# Patient Record
Sex: Female | Born: 1950
Health system: Southern US, Community
[De-identification: ages and names within clinical notes are randomized; demographics above are authoritative.]

## PROBLEM LIST (undated history)

## (undated) DIAGNOSIS — C9110 Chronic lymphocytic leukemia of B-cell type not having achieved remission: Secondary | ICD-10-CM

## (undated) DIAGNOSIS — F419 Anxiety disorder, unspecified: Secondary | ICD-10-CM

## (undated) DIAGNOSIS — F329 Major depressive disorder, single episode, unspecified: Secondary | ICD-10-CM

## (undated) DIAGNOSIS — Z8639 Personal history of other endocrine, nutritional and metabolic disease: Secondary | ICD-10-CM

## (undated) DIAGNOSIS — Z87898 Personal history of other specified conditions: Secondary | ICD-10-CM

## (undated) DIAGNOSIS — Z8659 Personal history of other mental and behavioral disorders: Secondary | ICD-10-CM

## (undated) DIAGNOSIS — F439 Reaction to severe stress, unspecified: Secondary | ICD-10-CM

## (undated) DIAGNOSIS — C801 Malignant (primary) neoplasm, unspecified: Secondary | ICD-10-CM

## (undated) DIAGNOSIS — I10 Essential (primary) hypertension: Secondary | ICD-10-CM

## (undated) DIAGNOSIS — F32A Depression, unspecified: Secondary | ICD-10-CM

## (undated) HISTORY — PX: TUBAL LIGATION: SHX77

## (undated) HISTORY — DX: Reaction to severe stress, unspecified: F43.9

## (undated) HISTORY — DX: Personal history of other specified conditions: Z87.898

## (undated) HISTORY — DX: Personal history of other mental and behavioral disorders: Z86.59

## (undated) HISTORY — DX: Personal history of other endocrine, nutritional and metabolic disease: Z86.39

## (undated) HISTORY — DX: Chronic lymphocytic leukemia of B-cell type not having achieved remission: C91.10

---

## 1999-07-03 ENCOUNTER — Other Ambulatory Visit: Admission: RE | Admit: 1999-07-03 | Discharge: 1999-07-03 | Payer: Self-pay | Admitting: Family Medicine

## 2000-08-26 ENCOUNTER — Other Ambulatory Visit: Admission: RE | Admit: 2000-08-26 | Discharge: 2000-08-26 | Payer: Self-pay | Admitting: Family Medicine

## 2002-09-14 ENCOUNTER — Other Ambulatory Visit: Admission: RE | Admit: 2002-09-14 | Discharge: 2002-09-14 | Payer: Self-pay | Admitting: Family Medicine

## 2003-12-04 ENCOUNTER — Other Ambulatory Visit: Admission: RE | Admit: 2003-12-04 | Discharge: 2003-12-04 | Payer: Self-pay | Admitting: Family Medicine

## 2005-02-11 ENCOUNTER — Ambulatory Visit: Payer: Self-pay | Admitting: Family Medicine

## 2005-02-16 ENCOUNTER — Ambulatory Visit: Payer: Self-pay | Admitting: Family Medicine

## 2005-10-06 ENCOUNTER — Encounter: Payer: Self-pay | Admitting: Family Medicine

## 2005-10-06 ENCOUNTER — Other Ambulatory Visit: Admission: RE | Admit: 2005-10-06 | Discharge: 2005-10-06 | Payer: Self-pay | Admitting: Family Medicine

## 2005-10-06 ENCOUNTER — Ambulatory Visit: Payer: Self-pay | Admitting: Family Medicine

## 2005-10-28 ENCOUNTER — Ambulatory Visit: Payer: Self-pay | Admitting: Family Medicine

## 2005-12-15 HISTORY — PX: COLONOSCOPY: SHX174

## 2005-12-31 ENCOUNTER — Ambulatory Visit: Payer: Self-pay | Admitting: Unknown Physician Specialty

## 2007-03-30 ENCOUNTER — Encounter: Payer: Self-pay | Admitting: Family Medicine

## 2007-03-30 DIAGNOSIS — F329 Major depressive disorder, single episode, unspecified: Secondary | ICD-10-CM

## 2007-03-30 DIAGNOSIS — R739 Hyperglycemia, unspecified: Secondary | ICD-10-CM

## 2007-03-30 DIAGNOSIS — N6019 Diffuse cystic mastopathy of unspecified breast: Secondary | ICD-10-CM | POA: Insufficient documentation

## 2007-05-02 ENCOUNTER — Ambulatory Visit: Payer: Self-pay | Admitting: Family Medicine

## 2007-05-19 ENCOUNTER — Encounter: Payer: Self-pay | Admitting: Family Medicine

## 2007-05-19 ENCOUNTER — Ambulatory Visit: Payer: Self-pay | Admitting: Family Medicine

## 2007-05-25 ENCOUNTER — Ambulatory Visit: Payer: Self-pay | Admitting: Family Medicine

## 2007-05-25 ENCOUNTER — Encounter: Payer: Self-pay | Admitting: Family Medicine

## 2007-06-01 ENCOUNTER — Encounter: Payer: Self-pay | Admitting: Family Medicine

## 2007-06-10 ENCOUNTER — Encounter (INDEPENDENT_AMBULATORY_CARE_PROVIDER_SITE_OTHER): Payer: Self-pay | Admitting: *Deleted

## 2007-12-08 ENCOUNTER — Ambulatory Visit: Payer: Self-pay | Admitting: Family Medicine

## 2007-12-08 DIAGNOSIS — F411 Generalized anxiety disorder: Secondary | ICD-10-CM | POA: Insufficient documentation

## 2008-01-30 ENCOUNTER — Telehealth: Payer: Self-pay | Admitting: Family Medicine

## 2008-05-10 ENCOUNTER — Ambulatory Visit: Payer: Self-pay | Admitting: Family Medicine

## 2008-05-10 ENCOUNTER — Other Ambulatory Visit: Admission: RE | Admit: 2008-05-10 | Discharge: 2008-05-10 | Payer: Self-pay | Admitting: Family Medicine

## 2008-05-10 ENCOUNTER — Encounter: Payer: Self-pay | Admitting: Family Medicine

## 2008-05-14 LAB — CONVERTED CEMR LAB
ALT: 31 units/L (ref 0–35)
Albumin: 4.2 g/dL (ref 3.5–5.2)
Alkaline Phosphatase: 69 units/L (ref 39–117)
BUN: 22 mg/dL (ref 6–23)
CO2: 29 meq/L (ref 19–32)
Calcium: 9.2 mg/dL (ref 8.4–10.5)
Cholesterol: 208 mg/dL (ref 0–200)
Eosinophils Relative: 3.3 % (ref 0.0–5.0)
GFR calc Af Amer: 112 mL/min
Glucose, Bld: 88 mg/dL (ref 70–99)
HCT: 38 % (ref 36.0–46.0)
Hemoglobin: 13.3 g/dL (ref 12.0–15.0)
Monocytes Absolute: 0.6 10*3/uL (ref 0.1–1.0)
Monocytes Relative: 8.6 % (ref 3.0–12.0)
Neutro Abs: 3.5 10*3/uL (ref 1.4–7.7)
RBC: 4.22 M/uL (ref 3.87–5.11)
TSH: 2.34 microintl units/mL (ref 0.35–5.50)
Total CHOL/HDL Ratio: 3.7
Total Protein: 7.4 g/dL (ref 6.0–8.3)
WBC: 6.5 10*3/uL (ref 4.5–10.5)

## 2008-05-24 ENCOUNTER — Encounter: Payer: Self-pay | Admitting: Family Medicine

## 2008-05-24 ENCOUNTER — Ambulatory Visit: Payer: Self-pay | Admitting: Family Medicine

## 2008-08-15 ENCOUNTER — Ambulatory Visit: Payer: Self-pay | Admitting: Family Medicine

## 2008-08-15 DIAGNOSIS — E7800 Pure hypercholesterolemia, unspecified: Secondary | ICD-10-CM | POA: Insufficient documentation

## 2008-08-16 LAB — CONVERTED CEMR LAB
ALT: 46 units/L — ABNORMAL HIGH (ref 0–35)
Cholesterol: 230 mg/dL (ref 0–200)
Direct LDL: 143.2 mg/dL
Total CHOL/HDL Ratio: 3.6

## 2008-09-20 ENCOUNTER — Ambulatory Visit: Payer: Self-pay | Admitting: Family Medicine

## 2008-09-20 DIAGNOSIS — R740 Nonspecific elevation of levels of transaminase and lactic acid dehydrogenase [LDH]: Secondary | ICD-10-CM

## 2008-11-01 ENCOUNTER — Telehealth: Payer: Self-pay | Admitting: Family Medicine

## 2009-03-12 ENCOUNTER — Telehealth: Payer: Self-pay | Admitting: Family Medicine

## 2009-05-20 ENCOUNTER — Telehealth: Payer: Self-pay | Admitting: Family Medicine

## 2009-09-06 ENCOUNTER — Ambulatory Visit: Payer: Self-pay | Admitting: Family Medicine

## 2009-09-09 LAB — CONVERTED CEMR LAB
ALT: 33 units/L (ref 0–35)
AST: 32 units/L (ref 0–37)
Albumin: 4.4 g/dL (ref 3.5–5.2)
Alkaline Phosphatase: 60 units/L (ref 39–117)
BUN: 14 mg/dL (ref 6–23)
Basophils Absolute: 0 10*3/uL (ref 0.0–0.1)
CO2: 31 meq/L (ref 19–32)
Cholesterol: 191 mg/dL (ref 0–200)
Eosinophils Absolute: 0.1 10*3/uL (ref 0.0–0.7)
GFR calc non Af Amer: 109.57 mL/min (ref >60)
Glucose, Bld: 85 mg/dL (ref 70–99)
Hemoglobin: 13.1 g/dL (ref 12.0–15.0)
Lymphocytes Relative: 35.9 % (ref 12.0–46.0)
MCHC: 33.3 g/dL (ref 30.0–36.0)
Monocytes Relative: 13 % — ABNORMAL HIGH (ref 3.0–12.0)
Neutro Abs: 2.4 10*3/uL (ref 1.4–7.7)
Neutrophils Relative %: 47.2 % (ref 43.0–77.0)
Platelets: 207 10*3/uL (ref 150.0–400.0)
Potassium: 4.2 meq/L (ref 3.5–5.1)
RDW: 11.8 % (ref 11.5–14.6)
Sodium: 141 meq/L (ref 135–145)
TSH: 2.54 microintl units/mL (ref 0.35–5.50)
Total Protein: 7.7 g/dL (ref 6.0–8.3)
VLDL: 14.8 mg/dL (ref 0.0–40.0)

## 2009-09-10 ENCOUNTER — Ambulatory Visit: Payer: Self-pay | Admitting: Family Medicine

## 2009-09-10 ENCOUNTER — Encounter: Payer: Self-pay | Admitting: Family Medicine

## 2009-09-16 ENCOUNTER — Encounter (INDEPENDENT_AMBULATORY_CARE_PROVIDER_SITE_OTHER): Payer: Self-pay | Admitting: *Deleted

## 2009-12-18 ENCOUNTER — Telehealth: Payer: Self-pay | Admitting: Family Medicine

## 2010-05-07 ENCOUNTER — Telehealth: Payer: Self-pay | Admitting: Family Medicine

## 2010-09-16 NOTE — Miscellaneous (Signed)
Summary: mammogram results  Clinical Lists Changes  Observations: Added new observation of MAMMO DUE: 09/2010 (09/16/2009 9:20) Added new observation of MAMMOGRAM: normal (09/16/2009 9:20)      Preventive Care Screening  Mammogram:    Date:  09/16/2009    Next Due:  09/2010    Results:  normal

## 2010-09-16 NOTE — Progress Notes (Signed)
Summary: refill request for alprazolam  Phone Note Refill Request Message from:  Fax from Pharmacy on May 07, 2010 10:22 AM  Refills Requested: Medication #1:  ALPRAZOLAM 0.5 MG  TABS 1 by mouth two times a day as needed severe anxiety   Last Refilled: Dec 01, 1950 refill request from [facility] pharmacy. [phone]  Initial call taken by: Melody Comas,  May 07, 2010 10:23 AM  Follow-up for Phone Call        px written on EMR for call in  Follow-up by: Judith Part MD,  May 07, 2010 11:14 AM  Additional Follow-up for Phone Call Additional follow up Details #1::        Medication phoned to pharmacy.  Additional Follow-up by: Delilah Shan CMA (AAMA),  May 07, 2010 11:29 AM    New/Updated Medications: ALPRAZOLAM 0.5 MG  TABS (ALPRAZOLAM) 1 by mouth two times a day as needed severe anxiety Prescriptions: ALPRAZOLAM 0.5 MG  TABS (ALPRAZOLAM) 1 by mouth two times a day as needed severe anxiety  #30 x 0   Entered and Authorized by:   Judith Part MD   Signed by:   Delilah Shan CMA (AAMA) on 05/07/2010   Method used:   Telephoned to ...         RxID:   [id number]

## 2010-09-16 NOTE — Assessment & Plan Note (Signed)
Summary: CPX/CLE   Vital Signs:  Patient profile:   60 year old female Height:      65.5 inches Weight:      146 pounds BMI:     24.01 Temp:     98.2 degrees F oral Pulse rate:   68 / minute Pulse rhythm:   regular BP sitting:   142 / 100  (left arm) Cuff size:   regular  Vitals Entered By: Lowella Petties CMA (September 06, 2009 10:57 AM)  Serial Vital Signs/Assessments:  Time      Position  BP       Pulse  Resp  Temp     By                     134/88                         Judith Part MD  CC: 30 minute check up   History of Present Illness: here for health mt exam and to rev chronic med problems has been feeling good overall   started on paxil - is working great , so not using other meds much stress is overall better too   wt is down 4 lb- with wt watchers - she really likes it  is doing it on line  is starting to exercise - elliptical    bp up today 140/100 on first check   lipids due - last LDL was in 140s   colonosc with polyp 07-- rec 10 y f/u   dexa nl in 07 ca and D- is not taking them   mam 10/09- needs to set up  self exam   Td 05 other imms got flu shot in nov  also got shingles vaccine   pap 9.09-- no problems or new partners 3 nl in a row   Allergies: 1)  ! Buspar 2)  Penicillin  Past History:  Past Medical History: Last updated: 09/20/2008 Depression with anx situational stress foot pain hyperlipidemia  fibrocystic breasts  Past Surgical History: Last updated: 03/30/2007 Tubal ligation Dexa-normal (12/2003) Colonoscopy- polyps, tics (12/2005)  Family History: Last updated: 12/08/2007 Father: DM late in life, chol Mother: cholesterol, mental illness Siblings: brother deceased- childhood leukemia, sister with clinical depression grandfather- schizophrenic  Social History: Last updated: 09/20/2008 Marital Status: divorced- re married Children: 3 Occupation: Film/video editor derm Former Smoker occ alcohol -- 1-2 glasses day  (inst to cut to 1 drink per day 2/10)  Risk Factors: Smoking Status: quit (05/02/2007)  Review of Systems General:  Denies fatigue, loss of appetite, and malaise. Eyes:  Denies blurring and eye pain. CV:  Denies chest pain or discomfort, palpitations, and shortness of breath with exertion. Resp:  Denies cough and wheezing. GI:  Denies abdominal pain, change in bowel habits, and indigestion. GU:  Denies abnormal vaginal bleeding, discharge, dysuria, and urinary frequency. MS:  Denies joint pain and muscle aches. Derm:  Denies itching, lesion(s), poor wound healing, and rash. Neuro:  Denies numbness and tingling. Psych:  mood is better . Endo:  Denies excessive thirst and excessive urination. Heme:  Denies abnormal bruising and bleeding.  Physical Exam  General:  Well-developed,well-nourished,in no acute distress; alert,appropriate and cooperative throughout examination Head:  normocephalic, atraumatic, and no abnormalities observed.   Eyes:  vision grossly intact, pupils equal, pupils round, and pupils reactive to light.  no conjunctival pallor, injection or icterus  Ears:  R ear normal and L  ear normal.   Nose:  no nasal discharge.   Mouth:  pharynx pink and moist.   Neck:  supple with full rom and no masses or thyromegally, no JVD or carotid bruit  Chest Wall:  No deformities, masses, or tenderness noted. Breasts:  No mass, nodules, thickening, tenderness, bulging, retraction, inflamation, nipple discharge or skin changes noted.   Lungs:  Normal respiratory effort, chest expands symmetrically. Lungs are clear to auscultation, no crackles or wheezes. Heart:  Normal rate and regular rhythm. S1 and S2 normal without gallop, murmur, click, rub or other extra sounds. Abdomen:  Bowel sounds positive,abdomen soft and non-tender without masses, organomegaly or hernias noted. no renal bruits  Msk:  No deformity or scoliosis noted of thoracic or lumbar spine.  no acute joint changes, full rom  of all joints  Pulses:  R and L carotid,radial,femoral,dorsalis pedis and posterior tibial pulses are full and equal bilaterally Extremities:  No clubbing, cyanosis, edema, or deformity noted with normal full range of motion of all joints.   Neurologic:  sensation intact to light touch, gait normal, and DTRs symmetrical and normal.   Skin:  Intact without suspicious lesions or rashes Cervical Nodes:  No lymphadenopathy noted Axillary Nodes:  No palpable lymphadenopathy Inguinal Nodes:  No significant adenopathy Psych:  normal affect, talkative and pleasant    Impression & Recommendations:  Problem # 1:  HEALTH MAINTENANCE EXAM (ICD-V70.0) reviewed health habits including diet, exercise and skin cancer prevention reviewed health maintenance list and family history better bp on second check today-- will continue to follow  Orders: Venipuncture (91478) TLB-Lipid Panel (80061-LIPID) TLB-BMP (Basic Metabolic Panel-BMET) (80048-METABOL) TLB-Hepatic/Liver Function Pnl (80076-HEPATIC) TLB-TSH (Thyroid Stimulating Hormone) (84443-TSH) TLB-CBC Platelet - w/Differential (85025-CBCD)  Problem # 2:  PURE HYPERCHOLESTEROLEMIA (ICD-272.0) Assessment: Unchanged  expect imp with better diet rev low sat fat diet lab today and adv  Orders: Venipuncture (29562) TLB-Lipid Panel (80061-LIPID) TLB-BMP (Basic Metabolic Panel-BMET) (80048-METABOL) TLB-Hepatic/Liver Function Pnl (80076-HEPATIC) TLB-TSH (Thyroid Stimulating Hormone) (84443-TSH) TLB-CBC Platelet - w/Differential (85025-CBCD)  Labs Reviewed: SGOT: 37 (08/15/2008)   SGPT: 46 (08/15/2008)   HDL:63.2 (08/15/2008), 56.1 (05/10/2008)  LDL:DEL (08/15/2008), DEL (05/10/2008)  Chol:230 (08/15/2008), 208 (05/10/2008)  Trig:87 (08/15/2008), 84 (05/10/2008)  Problem # 3:  ANXIETY (ICD-300.00) Assessment: Improved much imp with paxil- will contine this and good coping skills  Her updated medication list for this problem includes:     Alprazolam 0.5 Mg Tabs (Alprazolam) .Marland Kitchen... 1 by mouth two times a day as needed severe anxiety    Paxil 10 Mg Tabs (Paroxetine hcl) .Marland Kitchen... 1 by mouth at bedtime  Problem # 4:  OTHER SCREENING MAMMOGRAM (ICD-V76.12) Assessment: Comment Only annual mammogram scheduled adv pt to continue regular self breast exams non remarkable breast exam today  Orders: Radiology Referral (Radiology)  Complete Medication List: 1)  Alprazolam 0.5 Mg Tabs (Alprazolam) .Marland Kitchen.. 1 by mouth two times a day as needed severe anxiety 2)  Ambien 10 Mg Tabs (Zolpidem tartrate) .... 1/2 to 1 by mouth at bedtime as needed insomnia 3)  Paxil 10 Mg Tabs (Paroxetine hcl) .Marland Kitchen.. 1 by mouth at bedtime  Patient Instructions: 1)  the current recommendation for calcium intake is 1200-1500 mg daily with 1000 IU of vitamin D  2)  keep working on healthy diet and exercise  3)  labs today  Prescriptions: PAXIL 10 MG TABS (PAROXETINE HCL) 1 by mouth at bedtime  #30 x 11   Entered and Authorized by:   Judith Part MD   Signed  by:   Judith Part MD on 09/06/2009   Method used:   Print then Give to Patient   RxID:   [id number]   Prior Medications (reviewed today): ALPRAZOLAM 0.5 MG  TABS (ALPRAZOLAM) 1 by mouth two times a day as needed severe anxiety AMBIEN 10 MG TABS (ZOLPIDEM TARTRATE) 1/2 to 1 by mouth at bedtime as needed insomnia PAXIL 10 MG TABS (PAROXETINE HCL) 1 by mouth at bedtime Current Allergies: ! BUSPAR PENICILLIN   Influenza Immunization History:    Influenza # 1:  Fluvax 3+ (06/17/2009)

## 2010-09-16 NOTE — Progress Notes (Signed)
Summary: regarding paxil taper  Phone Note Call from Patient Call back at [phone]   Caller: Patient Call For: Judith Part MD Summary of Call: Pt has decided to taper off paxil.  She took 5 mg's for about 2 days and then went to every other day a couple of days ago.  She is having sed effects-  a lot of dizziness, some nausea from the dizziness.  She is asking if she is tapering too fast. Initial call taken by: Lowella Petties CMA,  Dec 18, 2009 10:26 AM  Follow-up for Phone Call        go back to 5 mg for about 2 weeks - then every other day-- I think that will help  Follow-up by: Judith Part MD,  Dec 18, 2009 11:17 AM  Additional Follow-up for Phone Call Additional follow up Details #1::        Patient notified as instructed by telephone. Lewanda Rife LPN  Dec 18, 5641 11:26 AM

## 2010-12-30 ENCOUNTER — Ambulatory Visit: Payer: Self-pay | Admitting: Family Medicine

## 2011-12-31 ENCOUNTER — Telehealth: Payer: Self-pay | Admitting: Family Medicine

## 2011-12-31 DIAGNOSIS — E7800 Pure hypercholesterolemia, unspecified: Secondary | ICD-10-CM

## 2011-12-31 DIAGNOSIS — R7309 Other abnormal glucose: Secondary | ICD-10-CM

## 2011-12-31 DIAGNOSIS — Z0000 Encounter for general adult medical examination without abnormal findings: Secondary | ICD-10-CM

## 2011-12-31 NOTE — Telephone Encounter (Signed)
Message copied by Judy Pimple on Thu Dec 31, 2011  7:13 PM ------      Message from: Baldomero Lamy      Created: Wed Dec 30, 2011 10:49 AM      Regarding: Cpx labs Fri 5/17       Please order  future cpx labs for pt's upcomming lab appt.      Thanks      Rodney Booze

## 2012-01-01 ENCOUNTER — Other Ambulatory Visit (INDEPENDENT_AMBULATORY_CARE_PROVIDER_SITE_OTHER): Payer: BC Managed Care – PPO

## 2012-01-01 DIAGNOSIS — Z0000 Encounter for general adult medical examination without abnormal findings: Secondary | ICD-10-CM

## 2012-01-01 DIAGNOSIS — E7800 Pure hypercholesterolemia, unspecified: Secondary | ICD-10-CM

## 2012-01-01 DIAGNOSIS — R7309 Other abnormal glucose: Secondary | ICD-10-CM

## 2012-01-01 LAB — CBC WITH DIFFERENTIAL/PLATELET
Basophils Absolute: 0 10*3/uL (ref 0.0–0.1)
Eosinophils Absolute: 0.2 10*3/uL (ref 0.0–0.7)
HCT: 38.1 % (ref 36.0–46.0)
Lymphs Abs: 2.2 10*3/uL (ref 0.7–4.0)
MCV: 90.2 fl (ref 78.0–100.0)
Monocytes Absolute: 0.6 10*3/uL (ref 0.1–1.0)
Platelets: 192 10*3/uL (ref 150.0–400.0)
RDW: 12.5 % (ref 11.5–14.6)

## 2012-01-01 LAB — TSH: TSH: 4 u[IU]/mL (ref 0.35–5.50)

## 2012-01-01 LAB — LIPID PANEL
HDL: 60.4 mg/dL (ref >39.00)
Total CHOL/HDL Ratio: 3
VLDL: 13.8 mg/dL (ref 0.0–40.0)

## 2012-01-01 LAB — COMPREHENSIVE METABOLIC PANEL
ALT: 33 U/L (ref 0–35)
Alkaline Phosphatase: 49 U/L (ref 39–117)
Sodium: 141 mEq/L (ref 135–145)
Total Bilirubin: 0.8 mg/dL (ref 0.3–1.2)
Total Protein: 6.9 g/dL (ref 6.0–8.3)

## 2012-01-05 ENCOUNTER — Encounter: Payer: Self-pay | Admitting: Family Medicine

## 2012-01-05 ENCOUNTER — Ambulatory Visit (INDEPENDENT_AMBULATORY_CARE_PROVIDER_SITE_OTHER): Payer: BC Managed Care – PPO | Admitting: Family Medicine

## 2012-01-05 ENCOUNTER — Other Ambulatory Visit (HOSPITAL_COMMUNITY)
Admission: RE | Admit: 2012-01-05 | Discharge: 2012-01-05 | Disposition: A | Discharge status: Home / Self Care | Payer: BC Managed Care – PPO | Source: Ambulatory Visit | Attending: Family Medicine | Admitting: Family Medicine

## 2012-01-05 VITALS — BP 112/80 | HR 64 | Temp 97.9°F | Ht 65.5 in | Wt 147.8 lb

## 2012-01-05 DIAGNOSIS — Z780 Asymptomatic menopausal state: Secondary | ICD-10-CM

## 2012-01-05 DIAGNOSIS — Z01419 Encounter for gynecological examination (general) (routine) without abnormal findings: Secondary | ICD-10-CM | POA: Insufficient documentation

## 2012-01-05 DIAGNOSIS — R7309 Other abnormal glucose: Secondary | ICD-10-CM

## 2012-01-05 DIAGNOSIS — Z1159 Encounter for screening for other viral diseases: Secondary | ICD-10-CM | POA: Insufficient documentation

## 2012-01-05 DIAGNOSIS — Z0000 Encounter for general adult medical examination without abnormal findings: Secondary | ICD-10-CM

## 2012-01-05 DIAGNOSIS — Z1231 Encounter for screening mammogram for malignant neoplasm of breast: Secondary | ICD-10-CM

## 2012-01-05 DIAGNOSIS — E7800 Pure hypercholesterolemia, unspecified: Secondary | ICD-10-CM

## 2012-01-05 NOTE — Assessment & Plan Note (Signed)
With foot fx  Schedule dexa Rev  Ca and D req

## 2012-01-05 NOTE — Assessment & Plan Note (Signed)
a1c below 6 Enc low glycemic diet

## 2012-01-05 NOTE — Patient Instructions (Signed)
We will schedule dexa and mammogram at check out  Try to get 1200-1500 mg of calcium per day with at least 1000 iu of vitamin D - for bone health  Keep working on healthy diet and exercise

## 2012-01-05 NOTE — Assessment & Plan Note (Signed)
?   Hx of hpv in past Pap done No problems

## 2012-01-05 NOTE — Assessment & Plan Note (Signed)
Scheduled annual screening mammogram Nl breast exam today  Encouraged monthly self exams   

## 2012-01-05 NOTE — Assessment & Plan Note (Signed)
Good control with diet Disc goals for lipids and reasons to control them Rev labs with pt Rev low sat fat diet in detail  

## 2012-01-05 NOTE — Assessment & Plan Note (Signed)
Reviewed health habits including diet and exercise and skin cancer prevention Also reviewed health mt list, fam hx and immunizations  Rev wellness lab Will start exercise as soon as foot is healed

## 2012-01-05 NOTE — Progress Notes (Signed)
Subjective:    Patient ID: Anita Buckley, female    DOB: 1950/10/11, 61 y.o.   MRN: [id number]  HPI Here for health maintenance exam and to review chronic medical problems   Has been feeling ok overall   Some stress- husband dx parkinsons  Fractured her foot  Very slow to heal - her R foot  In a boot for months  Is worrisome for OP -- xrays did show some thinning of the bone  ? If family hx Is better about her ca and D now   For 2 months has had pain in elbows- nsaids help  Severity fluctuates - wears ace bandage    Wt is stable with bmi of 24  Lipids Lab Results  Component Value Date   CHOL 190 01/01/2012   CHOL 191 09/06/2009   CHOL 230* 08/15/2008   Lab Results  Component Value Date   HDL 60.40 01/01/2012   HDL 40.98 09/06/2009   HDL 11.9 08/15/2008   Lab Results  Component Value Date   LDLCALC 116* 01/01/2012   LDLCALC 118* 09/06/2009   Lab Results  Component Value Date   TRIG 69.0 01/01/2012   TRIG 74.0 09/06/2009   TRIG 87 08/15/2008   Lab Results  Component Value Date   CHOLHDL 3 01/01/2012   CHOLHDL 3 09/06/2009   CHOLHDL 3.6 CALC 08/15/2008   Lab Results  Component Value Date   LDLDIRECT 143.2 08/15/2008   LDLDIRECT 141.2 05/10/2008   diet -- is pretty good for the most part  Exercise is temp limited  Tried to swim for a while    Hx of mildly high glucose Lab Results  Component Value Date   HGBA1C 5.5 01/01/2012   good control   Pap = last gyn exam , was in 2012 April  Does not plan to return  Thinks she has hpv - has had abn pap in the past   mammo 4/12  Needs to schedule at [facility]  Self exam-no lumps or changes   Colon cancer screening -- last colonoscopy - less than 10 years ago -- at 52   Flu shot - did get one in the fall   Patient Active Problem List  Diagnoses  . PURE HYPERCHOLESTEROLEMIA  . ANXIETY  . DEPRESSION  . FIBROCYSTIC BREAST DISEASE  . HYPERGLYCEMIA  . TRANSAMINASES, SERUM, ELEVATED  . Routine general medical  examination at a health care facility  . Other screening mammogram  . Gynecological examination  . Post-menopause   No past medical history on file. No past surgical history on file. History  Substance Use Topics  . Smoking status: Never Smoker   . Smokeless tobacco: Not on file  . Alcohol Use: Not on file   No family history on file. Allergies  Allergen Reactions  . Buspirone Hcl     REACTION: did not like  . Penicillins     REACTION: reaction as a child   Current Outpatient Prescriptions on File Prior to Visit  Medication Sig Dispense Refill  . Calcium Carbonate-Vit D-Min (CALCIUM 1200 PO) Take 1 capsule by mouth 3 (three) times daily.          Review of Systems Review of Systems  Constitutional: Negative for fever, appetite change, fatigue and unexpected weight change.  Eyes: Negative for pain and visual disturbance.  Respiratory: Negative for cough and shortness of breath.   Cardiovascular: Negative for cp or palpitations    Gastrointestinal: Negative for nausea, diarrhea and constipation.  Genitourinary: Negative for  urgency and frequency.  Skin: Negative for pallor or rash   MSK pos for pain from foot fracture, neg for acute joint changes or loss of ht Neurological: Negative for weakness, light-headedness, numbness and headaches.  Hematological: Negative for adenopathy. Does not bruise/bleed easily.  Psychiatric/Behavioral: Negative for dysphoric mood. The patient is not nervous/anxious.         Objective:   Physical Exam  Constitutional: She appears well-developed and well-nourished.  HENT:  Head: Normocephalic and atraumatic.  Right Ear: External ear normal.  Left Ear: External ear normal.  Nose: Nose normal.  Mouth/Throat: Oropharynx is clear and moist.  Eyes: Conjunctivae and EOM are normal. Pupils are equal, round, and reactive to light. No scleral icterus.  Neck: Normal range of motion. Neck supple. No JVD present. Carotid bruit is not present. No  thyromegaly present.  Cardiovascular: Normal rate, regular rhythm, normal heart sounds and intact distal pulses.  Exam reveals no gallop.   Pulmonary/Chest: Effort normal and breath sounds normal. No respiratory distress. She has no wheezes. She exhibits no tenderness.  Abdominal: Soft. Bowel sounds are normal. She exhibits no distension, no abdominal bruit and no mass. There is no tenderness.  Genitourinary: Vagina normal and uterus normal. No breast swelling, tenderness, discharge or bleeding. There is no rash or tenderness on the right labia. There is no rash or tenderness on the left labia. Uterus is not enlarged, not fixed and not tender. Cervix exhibits no motion tenderness and no friability. Right adnexum displays no mass, no tenderness and no fullness. Left adnexum displays no mass, no tenderness and no fullness. No tenderness or bleeding around the vagina. No vaginal discharge found.       Breast exam: No mass, nodules, thickening, tenderness, bulging, retraction, inflamation, nipple discharge or skin changes noted.  No axillary or clavicular LA.  Chaperoned exam.    Musculoskeletal: Normal range of motion. She exhibits no edema and no tenderness.  Lymphadenopathy:    She has no cervical adenopathy.  Neurological: She is alert. She has normal reflexes. No cranial nerve deficit. She exhibits normal muscle tone. Coordination normal.  Skin: Skin is warm and dry. No rash noted. No erythema. No pallor.  Psychiatric: She has a normal mood and affect.          Assessment & Plan:

## 2012-01-15 ENCOUNTER — Encounter: Payer: Self-pay | Admitting: *Deleted

## 2012-01-19 ENCOUNTER — Ambulatory Visit: Payer: Self-pay | Admitting: Family Medicine

## 2012-01-19 ENCOUNTER — Encounter: Payer: Self-pay | Admitting: Family Medicine

## 2012-01-19 LAB — HM DEXA SCAN

## 2012-01-20 ENCOUNTER — Encounter: Payer: Self-pay | Admitting: Family Medicine

## 2012-01-20 ENCOUNTER — Ambulatory Visit: Payer: Self-pay | Admitting: Family Medicine

## 2012-01-21 ENCOUNTER — Telehealth: Payer: Self-pay

## 2012-01-21 NOTE — Telephone Encounter (Signed)
See Results Encounter/SLS

## 2012-01-21 NOTE — Telephone Encounter (Signed)
Pt returned call. Call cell [phone].

## 2012-02-16 ENCOUNTER — Telehealth: Payer: Self-pay

## 2012-02-16 DIAGNOSIS — Z780 Asymptomatic menopausal state: Secondary | ICD-10-CM

## 2012-02-16 NOTE — Telephone Encounter (Signed)
Will order post men bone density test

## 2012-02-16 NOTE — Telephone Encounter (Signed)
Pt seen 01/05/12; had bone density at [facility] on 01/19/12. Pt request order changed from diagnostic to routine a post menopausal bone scan;Pt spoke with [facility] billing and they will resubmit to pts insurance if gets new diagnosis. Please advise.

## 2012-02-24 NOTE — Telephone Encounter (Signed)
Pt left v/m [facility] has not gotten order yet; request bone density order to [facility] routine bone density covered 100% sent to [facility].  Looks like Dr Milinda Antis put order in. Asher Muir said to send to her and she would ck on order.

## 2012-02-26 ENCOUNTER — Telehealth: Payer: Self-pay | Admitting: Family Medicine

## 2012-02-26 DIAGNOSIS — Z13820 Encounter for screening for osteoporosis: Secondary | ICD-10-CM | POA: Insufficient documentation

## 2012-02-26 NOTE — Telephone Encounter (Signed)
Spoke with pt and let her know I called the Financial services at [facility] for Anita Buckley to change the coding on the Bone Density. And fax over the new order Dr. Milinda Antis put in with correct Diagnosis.

## 2012-02-26 NOTE — Telephone Encounter (Signed)
Per [facility] now they require a different dx for dexa ? - I used post menopausal which is usually what they want, now they want screening I guess Here is revised order

## 2012-02-26 NOTE — Telephone Encounter (Signed)
Faxed over new order to [facility]. Thanks Dr. Milinda Antis

## 2012-02-26 NOTE — Telephone Encounter (Signed)
Pt left v/m wants call back about corrected form to [facility]. Pt DOES NOT WANT HOME PHONE CALLED. Call [phone]. Thank you.

## 2012-06-28 ENCOUNTER — Encounter: Payer: Self-pay | Admitting: *Deleted

## 2012-06-28 ENCOUNTER — Ambulatory Visit (INDEPENDENT_AMBULATORY_CARE_PROVIDER_SITE_OTHER): Payer: BC Managed Care – PPO | Admitting: Family Medicine

## 2012-06-28 ENCOUNTER — Ambulatory Visit
Admission: RE | Admit: 2012-06-28 | Discharge: 2012-06-28 | Disposition: A | Discharge status: Home / Self Care | Payer: BC Managed Care – PPO | Source: Ambulatory Visit | Attending: Family Medicine | Admitting: Family Medicine

## 2012-06-28 VITALS — BP 140/102 | HR 66 | Temp 98.2°F | Ht 65.5 in | Wt 149.8 lb

## 2012-06-28 DIAGNOSIS — E049 Nontoxic goiter, unspecified: Secondary | ICD-10-CM

## 2012-06-28 DIAGNOSIS — I10 Essential (primary) hypertension: Secondary | ICD-10-CM

## 2012-06-28 LAB — CBC WITH DIFFERENTIAL/PLATELET
Basophils Absolute: 0 10*3/uL (ref 0.0–0.1)
Basophils Relative: 0.5 % (ref 0.0–3.0)
Eosinophils Relative: 2.5 % (ref 0.0–5.0)
HCT: 40.4 % (ref 36.0–46.0)
Hemoglobin: 13.4 g/dL (ref 12.0–15.0)
Lymphocytes Relative: 38.3 % (ref 12.0–46.0)
Monocytes Relative: 11.2 % (ref 3.0–12.0)
Neutro Abs: 3.1 10*3/uL (ref 1.4–7.7)
RBC: 4.47 Mil/uL (ref 3.87–5.11)
RDW: 12.9 % (ref 11.5–14.6)
WBC: 6.4 10*3/uL (ref 4.5–10.5)

## 2012-06-28 LAB — TSH: TSH: 3.39 u[IU]/mL (ref 0.35–5.50)

## 2012-06-28 LAB — COMPREHENSIVE METABOLIC PANEL
ALT: 37 U/L — ABNORMAL HIGH (ref 0–35)
CO2: 28 mEq/L (ref 19–32)
Calcium: 9.2 mg/dL (ref 8.4–10.5)
Chloride: 103 mEq/L (ref 96–112)
Creatinine, Ser: 0.7 mg/dL (ref 0.4–1.2)
GFR: 90.82 mL/min (ref >60.00)
Glucose, Bld: 91 mg/dL (ref 70–99)

## 2012-06-28 LAB — LDL CHOLESTEROL, DIRECT: Direct LDL: 126.4 mg/dL

## 2012-06-28 LAB — LIPID PANEL
Cholesterol: 209 mg/dL — ABNORMAL HIGH (ref 0–200)
Total CHOL/HDL Ratio: 3

## 2012-06-28 NOTE — Assessment & Plan Note (Signed)
New in healthy pt with good habits Lab today  If nl - will start low dose ace and plan f/u Handout given  Also has goiter- ? If rel, pulse is nl

## 2012-06-28 NOTE — Patient Instructions (Addendum)
We will schedule a thyroid ultrasound at check out  Lab today  Based on labs results - we may need to start a low dose bp medicine  Will make a plan when everything returns

## 2012-06-28 NOTE — Assessment & Plan Note (Signed)
Asymptomatic  L sided thyroid enl Lab today  Also thyroid US Then make plan Handout given

## 2012-06-28 NOTE — Progress Notes (Signed)
Subjective:    Patient ID: Anita Buckley, female    DOB: 25-Sep-1950, 61 y.o.   MRN: [id number]  HPI Here for something on her neck Husband noticed a nodule on her neck in the middle - he felt it  Brand new    Lab Results  Component Value Date   TSH 4.00 01/01/2012   no thyroid symptoms at all    Mother had a goiter- she found out    She herself does not use iodized salt    bp is high today for her - this is new  She has been checking at home with husband's bp cuff -- 140-145/ diastolic in 90s  ? If any HTN in her family  BP: 140/102 mmHg   She eats a healthy diet  Also walks and goes to the gym Not overweight      Chemistry      Component Value Date/Time   NA 141 01/01/2012 0828   K 3.8 01/01/2012 0828   CL 107 01/01/2012 0828   CO2 26 01/01/2012 0828   BUN 21 01/01/2012 0828   CREATININE 0.7 01/01/2012 0828      Component Value Date/Time   CALCIUM 9.2 01/01/2012 0828   ALKPHOS 49 01/01/2012 0828   AST 28 01/01/2012 0828   ALT 33 01/01/2012 0828   BILITOT 0.8 01/01/2012 0828        Patient Active Problem List  Diagnosis  . PURE HYPERCHOLESTEROLEMIA  . ANXIETY  . DEPRESSION  . FIBROCYSTIC BREAST DISEASE  . HYPERGLYCEMIA  . TRANSAMINASES, SERUM, ELEVATED  . Routine general medical examination at a health care facility  . Other screening mammogram  . Gynecological examination  . Post-menopause  . Screening for osteoporosis  . Goiter  . HTN (hypertension), benign  . Left thyroid nodule   Past Medical History  Diagnosis Date  . History of depression   . History of anxiety   . Situational stress   . History of hyperlipidemia   . History of fibrocystic disease of breast    Past Surgical History  Procedure Date  . Tubal ligation   . Colonoscopy 12/2005    polyps, tics   History  Substance Use Topics  . Smoking status: Never Smoker   . Smokeless tobacco: Not on file  . Alcohol Use: Yes     Comment: daily wine with dinner   Family History    Problem Relation Age of Onset  . Diabetes Father     late in life  . Alcohol abuse Father   . Hyperlipidemia Mother   . Mental illness Mother   . Depression Sister   . Leukemia Brother   . Schizophrenia Other     Grandfather   Allergies  Allergen Reactions  . Buspirone Hcl     REACTION: did not like  . Penicillins     REACTION: reaction as a child   Current Outpatient Prescriptions on File Prior to Visit  Medication Sig Dispense Refill  . Calcium Carbonate-Vit D-Min (CALCIUM 1200 PO) Take 1 capsule by mouth 3 (three) times daily.      . Cholecalciferol (VITAMIN D-3) 5000 UNITS TABS Take 1 tablet by mouth daily.         Review of Systems Review of Systems  Constitutional: Negative for fever, appetite change, fatigue and unexpected weight change.  Eyes: Negative for pain and visual disturbance.  Respiratory: Negative for cough and shortness of breath.   Cardiovascular: Negative for cp or palpitations    Gastrointestinal:  Negative for nausea, diarrhea and constipation.  Genitourinary: Negative for urgency and frequency.  Skin: Negative for pallor or rash   Neurological: Negative for weakness, light-headedness, numbness and headaches.  Hematological: Negative for adenopathy. Does not bruise/bleed easily.  Psychiatric/Behavioral: Negative for dysphoric mood. The patient is not nervous/anxious.         Objective:   Physical Exam  Constitutional: She appears well-developed and well-nourished. No distress.  HENT:  Head: Normocephalic and atraumatic.  Mouth/Throat: Oropharynx is clear and moist.  Eyes: Conjunctivae normal and EOM are normal. Pupils are equal, round, and reactive to light. Right eye exhibits no discharge. Left eye exhibits no discharge. No scleral icterus.  Neck: Normal range of motion. Neck supple. No JVD present. Carotid bruit is not present. Thyromegaly present.       L sided thyroid enlargement - nontender No thyroid bruits   Cardiovascular: Normal rate,  regular rhythm, normal heart sounds and intact distal pulses.  Exam reveals no gallop.   Pulmonary/Chest: Effort normal and breath sounds normal. No respiratory distress. She has no wheezes.  Abdominal: Soft. Bowel sounds are normal. She exhibits no distension, no abdominal bruit and no mass. There is no tenderness.  Musculoskeletal: She exhibits no edema.  Lymphadenopathy:    She has no cervical adenopathy.  Neurological: She is alert. She has normal reflexes. She displays no tremor. No cranial nerve deficit. She exhibits normal muscle tone. Coordination normal.  Skin: Skin is warm and dry. No rash noted. No erythema. No pallor.  Psychiatric: She has a normal mood and affect.          Assessment & Plan:

## 2012-06-29 ENCOUNTER — Telehealth: Payer: Self-pay | Admitting: Family Medicine

## 2012-06-29 DIAGNOSIS — E041 Nontoxic single thyroid nodule: Secondary | ICD-10-CM | POA: Insufficient documentation

## 2012-06-29 NOTE — Telephone Encounter (Signed)
Message copied by Judy Pimple on Wed Jun 29, 2012  1:21 PM ------      Message from: Shon Millet      Created: Wed Jun 29, 2012  9:06 AM       Pt notified and agrees to see endocrinologist, needs referral

## 2012-08-12 ENCOUNTER — Encounter: Payer: Self-pay | Admitting: Family Medicine

## 2012-08-12 ENCOUNTER — Ambulatory Visit (INDEPENDENT_AMBULATORY_CARE_PROVIDER_SITE_OTHER): Payer: BC Managed Care – PPO | Admitting: Family Medicine

## 2012-08-12 VITALS — BP 118/86 | HR 68 | Temp 98.2°F | Ht 65.5 in | Wt 149.0 lb

## 2012-08-12 DIAGNOSIS — I10 Essential (primary) hypertension: Secondary | ICD-10-CM

## 2012-08-12 MED ORDER — LISINOPRIL 10 MG PO TABS: 10.0000 mg | ORAL_TABLET | Freq: Every day | ORAL | Status: DC

## 2012-08-12 NOTE — Assessment & Plan Note (Signed)
bp in fair control at this time  No changes needed  Disc lifstyle change with low sodium diet and exercise   Much improved with lisinopril and will f/u 6 mo  Enc to keep up good health habits

## 2012-08-12 NOTE — Patient Instructions (Signed)
Stay on the same lisinopril dose and alert me if any problems Check bp at home occasionally when you are very relaxed  Follow up in about 6 months  Take care of yourself

## 2012-08-12 NOTE — Progress Notes (Signed)
  Subjective:    Patient ID: Anita Buckley, female    DOB: 1951-06-21, 60 y.o.   MRN: [id number]  HPI On lisinopril 10 mg - and doing well  HTN is much improved   bp is stable today  No cp or palpitations or headaches or edema  No side effects to medicines  BP Readings from Last 3 Encounters:  08/12/12 118/86  06/28/12 140/102  01/05/12 112/80     Saw endocrinologist yesterday for goiter  Will have 2 bx for jan 29th of thyroid nodule     Chemistry      Component Value Date/Time   NA 138 06/28/2012 1142   K 3.9 06/28/2012 1142   CL 103 06/28/2012 1142   CO2 28 06/28/2012 1142   BUN 18 06/28/2012 1142   CREATININE 0.7 06/28/2012 1142      Component Value Date/Time   CALCIUM 9.2 06/28/2012 1142   ALKPHOS 62 06/28/2012 1142   AST 33 06/28/2012 1142   ALT 37* 06/28/2012 1142   BILITOT 0.8 06/28/2012 1142      Lab Results  Component Value Date   CHOL 209* 06/28/2012   HDL 69.70 06/28/2012   LDLCALC 116* 01/01/2012   LDLDIRECT 126.4 06/28/2012   TRIG 77.0 06/28/2012   CHOLHDL 3 06/28/2012     Review of Systems Review of Systems  Constitutional: Negative for fever, appetite change, fatigue and unexpected weight change.  Eyes: Negative for pain and visual disturbance.  Respiratory: Negative for cough and shortness of breath.   Cardiovascular: Negative for cp or palpitations    Gastrointestinal: Negative for nausea, diarrhea and constipation.  Genitourinary: Negative for urgency and frequency.  Skin: Negative for pallor or rash   Neurological: Negative for weakness, light-headedness, numbness and headaches.  Hematological: Negative for adenopathy. Does not bruise/bleed easily.  Psychiatric/Behavioral: Negative for dysphoric mood. The patient is not nervous/anxious.         Objective:   Physical Exam  Constitutional: She appears well-developed and well-nourished. No distress.  HENT:  Head: Normocephalic and atraumatic.  Mouth/Throat: Oropharynx is clear and  moist.  Eyes: Conjunctivae normal and EOM are normal. Pupils are equal, round, and reactive to light. No scleral icterus.  Neck: Normal range of motion. Neck supple. Thyromegaly present.  Cardiovascular: Normal rate, regular rhythm, normal heart sounds and intact distal pulses.  Exam reveals no gallop.   Pulmonary/Chest: Effort normal and breath sounds normal. No respiratory distress.       No crackles  Abdominal: Soft. Bowel sounds are normal.  Musculoskeletal: She exhibits no edema.  Lymphadenopathy:    She has no cervical adenopathy.  Neurological: She is alert. She has normal reflexes. No cranial nerve deficit. She exhibits normal muscle tone. Coordination normal.  Skin: Skin is warm and dry. No rash noted. No erythema. No pallor.  Psychiatric: She has a normal mood and affect.          Assessment & Plan:

## 2012-10-31 ENCOUNTER — Telehealth: Payer: Self-pay

## 2012-10-31 MED ORDER — ALPRAZOLAM 0.5 MG PO TABS: 0.5000 mg | ORAL_TABLET | Freq: Every day | ORAL | Status: DC | PRN

## 2012-10-31 NOTE — Telephone Encounter (Signed)
Pt request refill generic Xanax to [facility]	[place]. Pt said her father had a stroke 2 weeks ago and she has been helping take care of him;Xanax not on med list but pt said last filled 2011.Please advise.

## 2012-10-31 NOTE — Telephone Encounter (Signed)
Ok for short term use- do not drive with it - warn it can be habit forming so use caution and f/u with me if anxiety persists  Px written for call in

## 2012-10-31 NOTE — Telephone Encounter (Signed)
Rx called in as prescribed, pt notified and advised of Dr. Royden Purl recommendations

## 2012-12-28 ENCOUNTER — Telehealth: Payer: Self-pay | Admitting: Family Medicine

## 2012-12-28 DIAGNOSIS — E7800 Pure hypercholesterolemia, unspecified: Secondary | ICD-10-CM

## 2012-12-28 DIAGNOSIS — Z0000 Encounter for general adult medical examination without abnormal findings: Secondary | ICD-10-CM

## 2012-12-28 DIAGNOSIS — R7309 Other abnormal glucose: Secondary | ICD-10-CM

## 2012-12-28 NOTE — Telephone Encounter (Signed)
Message copied by Judy Pimple on Wed Dec 28, 2012 10:07 PM ------      Message from: Alvina Chou      Created: Fri Dec 16, 2012  9:54 AM      Regarding: Lab orders for Friday, 5.16.14       Patient is scheduled for CPX labs, please order future labs, Thanks , Terri       ------

## 2012-12-30 ENCOUNTER — Other Ambulatory Visit (INDEPENDENT_AMBULATORY_CARE_PROVIDER_SITE_OTHER): Payer: BC Managed Care – PPO

## 2012-12-30 DIAGNOSIS — R7309 Other abnormal glucose: Secondary | ICD-10-CM

## 2012-12-30 DIAGNOSIS — E7800 Pure hypercholesterolemia, unspecified: Secondary | ICD-10-CM

## 2012-12-30 DIAGNOSIS — Z0000 Encounter for general adult medical examination without abnormal findings: Secondary | ICD-10-CM

## 2012-12-30 LAB — CBC WITH DIFFERENTIAL/PLATELET
Basophils Relative: 0.5 % (ref 0.0–3.0)
Eosinophils Relative: 3.3 % (ref 0.0–5.0)
MCV: 88.9 fl (ref 78.0–100.0)
Monocytes Absolute: 0.6 10*3/uL (ref 0.1–1.0)
Monocytes Relative: 10.5 % (ref 3.0–12.0)
Neutrophils Relative %: 48 % (ref 43.0–77.0)
RBC: 4.39 Mil/uL (ref 3.87–5.11)
WBC: 6.1 10*3/uL (ref 4.5–10.5)

## 2012-12-30 LAB — LIPID PANEL
HDL: 54.4 mg/dL (ref >39.00)
LDL Cholesterol: 116 mg/dL — ABNORMAL HIGH (ref 0–99)
Total CHOL/HDL Ratio: 3
Triglycerides: 89 mg/dL (ref 0.0–149.0)
VLDL: 17.8 mg/dL (ref 0.0–40.0)

## 2013-01-06 ENCOUNTER — Ambulatory Visit (INDEPENDENT_AMBULATORY_CARE_PROVIDER_SITE_OTHER): Payer: BC Managed Care – PPO | Admitting: Family Medicine

## 2013-01-06 ENCOUNTER — Encounter: Payer: Self-pay | Admitting: Family Medicine

## 2013-01-06 VITALS — BP 122/88 | HR 80 | Temp 98.8°F | Ht 65.5 in | Wt 148.8 lb

## 2013-01-06 DIAGNOSIS — M949 Disorder of cartilage, unspecified: Secondary | ICD-10-CM

## 2013-01-06 DIAGNOSIS — R7309 Other abnormal glucose: Secondary | ICD-10-CM

## 2013-01-06 DIAGNOSIS — M8580 Other specified disorders of bone density and structure, unspecified site: Secondary | ICD-10-CM | POA: Insufficient documentation

## 2013-01-06 DIAGNOSIS — E7800 Pure hypercholesterolemia, unspecified: Secondary | ICD-10-CM

## 2013-01-06 DIAGNOSIS — I10 Essential (primary) hypertension: Secondary | ICD-10-CM

## 2013-01-06 DIAGNOSIS — Z0000 Encounter for general adult medical examination without abnormal findings: Secondary | ICD-10-CM

## 2013-01-06 NOTE — Progress Notes (Signed)
Subjective:    Patient ID: Anita Buckley, female    DOB: [birth year], 62 y.o.   MRN: [id number]  HPI Here for health maintenance exam and to review chronic medical problems  Has been feeling very good  Was pleased with her labs    Wt stable with bmi of 24 Exercise is sporatic  Lost her father this year to a stroke - she is handling grief fairly well now -no longer needs medicines  colonosc 5/07- 10 year recall per pt   Zoster vaccine 10/08 Flu shot 10/13 Td 1/05 mammo 6/13-will make her own appt   Self exam-no lumps or changes  Pap 5/13--no gyn problems   Hyperglycemia Lab Results  Component Value Date   HGBA1C 5.6 12/30/2012   does watch sugar in diet   Osteopenia  Mild dexa 6/13 Ca and D- tries to be good about that   bp is stable today  No cp or palpitations or headaches or edema  No side effects to medicines  BP Readings from Last 3 Encounters:  01/06/13 122/88  08/12/12 118/86  06/28/12 140/102      Lipids Lab Results  Component Value Date   CHOL 188 12/30/2012   CHOL 209* 06/28/2012   CHOL 190 01/01/2012   Lab Results  Component Value Date   HDL 54.40 12/30/2012   HDL 40.98 06/28/2012   HDL 60.40 01/01/2012   Lab Results  Component Value Date   LDLCALC 116* 12/30/2012   LDLCALC 116* 01/01/2012   LDLCALC 118* 09/06/2009   Lab Results  Component Value Date   TRIG 89.0 12/30/2012   TRIG 77.0 06/28/2012   TRIG 69.0 01/01/2012   Lab Results  Component Value Date   CHOLHDL 3 12/30/2012   CHOLHDL 3 06/28/2012   CHOLHDL 3 01/01/2012   Lab Results  Component Value Date   LDLDIRECT 126.4 06/28/2012   LDLDIRECT 143.2 08/15/2008   LDLDIRECT 141.2 05/10/2008    Overall good - has taken some red yeast rice - thinks that helps a bit-no side effects  Also healthy diet   Patient Active Problem List   Diagnosis Date Noted  . Osteopenia 01/06/2013  . Left thyroid nodule 06/29/2012  . Goiter 06/28/2012  . HTN (hypertension), benign 06/28/2012  . Other  screening mammogram 01/05/2012  . Gynecological examination 01/05/2012  . Post-menopause 01/05/2012  . Routine general medical examination at a health care facility 12/31/2011  . TRANSAMINASES, SERUM, ELEVATED 09/20/2008  . PURE HYPERCHOLESTEROLEMIA 08/15/2008  . ANXIETY 12/08/2007  . DEPRESSION 03/30/2007  . FIBROCYSTIC BREAST DISEASE 03/30/2007  . HYPERGLYCEMIA 03/30/2007   Past Medical History  Diagnosis Date  . History of depression   . History of anxiety   . Situational stress   . History of hyperlipidemia   . History of fibrocystic disease of breast    Past Surgical History  Procedure Laterality Date  . Tubal ligation    . Colonoscopy  12/2005    polyps, tics   History  Substance Use Topics  . Smoking status: Never Smoker   . Smokeless tobacco: Not on file  . Alcohol Use: Yes     Comment: daily wine with dinner   Family History  Problem Relation Age of Onset  . Diabetes Father     late in life  . Alcohol abuse Father   . Hyperlipidemia Mother   . Mental illness Mother   . Depression Sister   . Leukemia Brother   . Schizophrenia Other     Grandfather  Allergies  Allergen Reactions  . Buspirone Hcl     REACTION: did not like  . Penicillins     REACTION: reaction as a child   Current Outpatient Prescriptions on File Prior to Visit  Medication Sig Dispense Refill  . ALPRAZolam (XANAX) 0.5 MG tablet Take 1 tablet (0.5 mg total) by mouth daily as needed for anxiety.  30 tablet  0  . Calcium Carbonate-Vit D-Min (CALCIUM 1200 PO) Take 1 capsule by mouth daily.       . Cholecalciferol (VITAMIN D-3) 5000 UNITS TABS Take 1 tablet by mouth daily.      Marland Kitchen lisinopril (PRINIVIL,ZESTRIL) 10 MG tablet Take 1 tablet (10 mg total) by mouth daily.  30 tablet  11   No current facility-administered medications on file prior to visit.    Review of Systems Review of Systems  Constitutional: Negative for fever, appetite change, fatigue and unexpected weight change.  Eyes:  Negative for pain and visual disturbance.  Respiratory: Negative for cough and shortness of breath.   Cardiovascular: Negative for cp or palpitations    Gastrointestinal: Negative for nausea, diarrhea and constipation.  Genitourinary: Negative for urgency and frequency.  Skin: Negative for pallor or rash   Neurological: Negative for weakness, light-headedness, numbness and headaches.  Hematological: Negative for adenopathy. Does not bruise/bleed easily.  Psychiatric/Behavioral: Negative for dysphoric mood. The patient is not nervous/anxious.         Objective:   Physical Exam  Constitutional: She appears well-developed and well-nourished. No distress.  HENT:  Head: Normocephalic and atraumatic.  Right Ear: External ear normal.  Left Ear: External ear normal.  Mouth/Throat: Oropharynx is clear and moist.  Eyes: Conjunctivae and EOM are normal. Pupils are equal, round, and reactive to light. Right eye exhibits no discharge. Left eye exhibits no discharge. No scleral icterus.  Neck: Normal range of motion. Neck supple. No JVD present. Carotid bruit is not present. No thyromegaly present.  Cardiovascular: Normal rate, regular rhythm, normal heart sounds and intact distal pulses.  Exam reveals no gallop.   Pulmonary/Chest: Effort normal and breath sounds normal. No respiratory distress. She has no wheezes.  Abdominal: Soft. Bowel sounds are normal. She exhibits no distension, no abdominal bruit and no mass. There is no tenderness.  Genitourinary: No breast swelling, tenderness, discharge or bleeding.  Breast exam: No mass, nodules, thickening, tenderness, bulging, retraction, inflamation, nipple discharge or skin changes noted.  No axillary or clavicular LA.  Chaperoned exam.    Musculoskeletal: She exhibits no edema and no tenderness.  Lymphadenopathy:    She has no cervical adenopathy.  Neurological: She is alert. She has normal reflexes. No cranial nerve deficit. She exhibits normal  muscle tone. Coordination normal.  Skin: Skin is warm and dry. No rash noted. No erythema. No pallor.  Psychiatric: She has a normal mood and affect.          Assessment & Plan:

## 2013-01-06 NOTE — Patient Instructions (Addendum)
You will be due for a mammogram in June - don't forget to schedule that  Keep up a healthy diet and exercise

## 2013-01-08 NOTE — Assessment & Plan Note (Signed)
Reviewed health habits including diet and exercise and skin cancer prevention Also reviewed health mt list, fam hx and immunizations   Wellness labs disc in detail

## 2013-01-08 NOTE — Assessment & Plan Note (Signed)
bp in fair control at this time  No changes needed  Disc lifstyle change with low sodium diet and exercise   

## 2013-01-08 NOTE — Assessment & Plan Note (Signed)
Lab Results  Component Value Date   HGBA1C 5.6 12/30/2012    Overall stable and controlled by diet

## 2013-01-08 NOTE — Assessment & Plan Note (Signed)
Rev last dexa  Disc ca and D and exercise  Safety/ fx risk discussed  Due for dexa in about a year for 2 y f/u

## 2013-01-08 NOTE — Assessment & Plan Note (Signed)
Disc goals for lipids and reasons to control them Rev labs with pt Rev low sat fat diet in detail  Will continue red yeast rice if no problems

## 2013-01-30 ENCOUNTER — Encounter: Payer: Self-pay | Admitting: Family Medicine

## 2013-02-10 ENCOUNTER — Ambulatory Visit: Payer: BC Managed Care – PPO | Admitting: Family Medicine

## 2013-02-13 ENCOUNTER — Ambulatory Visit: Payer: BC Managed Care – PPO | Admitting: Family Medicine

## 2013-02-23 ENCOUNTER — Ambulatory Visit: Payer: Self-pay | Admitting: Family Medicine

## 2013-02-24 ENCOUNTER — Encounter: Payer: Self-pay | Admitting: Family Medicine

## 2013-05-01 ENCOUNTER — Telehealth: Payer: Self-pay

## 2013-05-01 NOTE — Telephone Encounter (Signed)
Pt left v/m requesting cb re: [employer] insurance still pending for urinalysis,chg $700.00 DOS 01/11/13. Pt request cb with what chg is for and status of insurance claim. Spoke with pt advised to contact [employer] [phone]. Pt voiced understanding.

## 2013-05-03 ENCOUNTER — Encounter: Payer: Self-pay | Admitting: Family Medicine

## 2013-05-04 ENCOUNTER — Telehealth: Payer: Self-pay | Admitting: Family Medicine

## 2013-05-04 MED ORDER — AMLODIPINE BESYLATE 5 MG PO TABS: 5.0000 mg | ORAL_TABLET | Freq: Every day | ORAL | Status: DC

## 2013-05-04 NOTE — Telephone Encounter (Signed)
resp to my chart req

## 2013-05-29 ENCOUNTER — Ambulatory Visit (INDEPENDENT_AMBULATORY_CARE_PROVIDER_SITE_OTHER): Payer: BC Managed Care – PPO | Admitting: Family Medicine

## 2013-05-29 ENCOUNTER — Encounter: Payer: Self-pay | Admitting: Family Medicine

## 2013-05-29 VITALS — BP 130/84 | HR 69 | Temp 98.0°F | Ht 65.5 in | Wt 149.0 lb

## 2013-05-29 DIAGNOSIS — I10 Essential (primary) hypertension: Secondary | ICD-10-CM

## 2013-05-29 NOTE — Patient Instructions (Signed)
Take care of yourself  Continue amlodipine for blood pressure

## 2013-05-29 NOTE — Assessment & Plan Note (Signed)
Changed from ace to amlodipine due to new bee sting anaphylaxis hx  Doing well  bp is fairly controlled Rev lifestyle habits

## 2013-05-29 NOTE — Progress Notes (Signed)
Subjective:    Patient ID: Anita Buckley, female    DOB: 02/04/1951, 62 y.o.   MRN: [id number]  HPI Here for HTN - after a change in medicine due to hx of bee sting allergy   Is better about tolerating antihistamine and H2 blocker   Had to change from ace to amlodipine No problems or side eff  bp is stable today  No cp or palpitations or headaches or edema  No side effects to medicines  BP Readings from Last 3 Encounters:  05/29/13 130/84  01/06/13 122/88  08/12/12 118/86      Patient Active Problem List   Diagnosis Date Noted  . Osteopenia 01/06/2013  . Left thyroid nodule 06/29/2012  . Goiter 06/28/2012  . HTN (hypertension), benign 06/28/2012  . Other screening mammogram 01/05/2012  . Gynecological examination 01/05/2012  . Post-menopause 01/05/2012  . Routine general medical examination at a health care facility 12/31/2011  . TRANSAMINASES, SERUM, ELEVATED 09/20/2008  . PURE HYPERCHOLESTEROLEMIA 08/15/2008  . ANXIETY 12/08/2007  . DEPRESSION 03/30/2007  . FIBROCYSTIC BREAST DISEASE 03/30/2007  . HYPERGLYCEMIA 03/30/2007   Past Medical History  Diagnosis Date  . History of depression   . History of anxiety   . Situational stress   . History of hyperlipidemia   . History of fibrocystic disease of breast    Past Surgical History  Procedure Laterality Date  . Tubal ligation    . Colonoscopy  12/2005    polyps, tics   History  Substance Use Topics  . Smoking status: Never Smoker   . Smokeless tobacco: Not on file  . Alcohol Use: Yes     Comment: daily 2 glasses wine with dinner   Family History  Problem Relation Age of Onset  . Diabetes Father     late in life  . Alcohol abuse Father   . Hyperlipidemia Mother   . Mental illness Mother   . Depression Sister   . Leukemia Brother   . Schizophrenia Other     Grandfather   Allergies  Allergen Reactions  . Bee Venom     Anaphylaxis   . Buspirone Hcl     REACTION: did not like  . Penicillins      REACTION: reaction as a child   Current Outpatient Prescriptions on File Prior to Visit  Medication Sig Dispense Refill  . ALPRAZolam (XANAX) 0.5 MG tablet Take 1 tablet (0.5 mg total) by mouth daily as needed for anxiety.  30 tablet  0  . Cholecalciferol (VITAMIN D-3) 5000 UNITS TABS Take 1 tablet by mouth daily.      Marland Kitchen EPIPEN 2-PAK 0.3 MG/0.3ML SOAJ as needed.      . Red Yeast Rice Extract (RED YEAST RICE PO) Take 1 tablet by mouth 2 (two) times daily.       No current facility-administered medications on file prior to visit.      Review of Systems Review of Systems  Constitutional: Negative for fever, appetite change, fatigue and unexpected weight change.  Eyes: Negative for pain and visual disturbance.  Respiratory: Negative for cough and shortness of breath.   Cardiovascular: Negative for cp or palpitations    Gastrointestinal: Negative for nausea, diarrhea and constipation.  Genitourinary: Negative for urgency and frequency.  Skin: Negative for pallor or rash   Neurological: Negative for weakness, light-headedness, numbness and headaches.  Hematological: Negative for adenopathy. Does not bruise/bleed easily.  Psychiatric/Behavioral: Negative for dysphoric mood. The patient is not nervous/anxious.  pos for stressors  Objective:   Physical Exam  Constitutional: She appears well-developed and well-nourished. No distress.  HENT:  Head: Normocephalic and atraumatic.  Eyes: Conjunctivae and EOM are normal. Pupils are equal, round, and reactive to light. No scleral icterus.  Neck: Normal range of motion. Neck supple. Thyromegaly present.  Cardiovascular: Normal rate, regular rhythm, normal heart sounds and intact distal pulses.  Exam reveals no gallop.   Pulmonary/Chest: Effort normal and breath sounds normal. She has no wheezes. She has no rales.  No crackles   Musculoskeletal: She exhibits no edema.  Lymphadenopathy:    She has no cervical adenopathy.   Neurological: She is alert. She has normal reflexes.  Skin: Skin is warm and dry. No rash noted.  Psychiatric: She has a normal mood and affect.          Assessment & Plan:

## 2013-08-31 ENCOUNTER — Other Ambulatory Visit: Payer: Self-pay | Admitting: Family Medicine

## 2014-01-03 ENCOUNTER — Telehealth: Payer: Self-pay | Admitting: Family Medicine

## 2014-01-03 DIAGNOSIS — R7309 Other abnormal glucose: Secondary | ICD-10-CM

## 2014-01-03 DIAGNOSIS — Z0000 Encounter for general adult medical examination without abnormal findings: Secondary | ICD-10-CM

## 2014-01-03 DIAGNOSIS — M8580 Other specified disorders of bone density and structure, unspecified site: Secondary | ICD-10-CM

## 2014-01-03 NOTE — Telephone Encounter (Signed)
Message copied by Abner Greenspan on Wed Jan 03, 2014  6:48 PM ------      Message from: Ellamae Sia      Created: Wed Dec 27, 2013  5:04 PM      Regarding: Lab orders for Thursday, 5.21.15       Patient is scheduled for CPX labs, please order future labs, Thanks , Terri       ------

## 2014-01-04 ENCOUNTER — Other Ambulatory Visit (INDEPENDENT_AMBULATORY_CARE_PROVIDER_SITE_OTHER): Payer: BC Managed Care – PPO

## 2014-01-04 DIAGNOSIS — M8580 Other specified disorders of bone density and structure, unspecified site: Secondary | ICD-10-CM

## 2014-01-04 DIAGNOSIS — I10 Essential (primary) hypertension: Secondary | ICD-10-CM

## 2014-01-04 DIAGNOSIS — M949 Disorder of cartilage, unspecified: Secondary | ICD-10-CM

## 2014-01-04 DIAGNOSIS — R7309 Other abnormal glucose: Secondary | ICD-10-CM

## 2014-01-04 DIAGNOSIS — E7800 Pure hypercholesterolemia, unspecified: Secondary | ICD-10-CM

## 2014-01-04 DIAGNOSIS — E049 Nontoxic goiter, unspecified: Secondary | ICD-10-CM

## 2014-01-04 DIAGNOSIS — Z0000 Encounter for general adult medical examination without abnormal findings: Secondary | ICD-10-CM

## 2014-01-04 DIAGNOSIS — M899 Disorder of bone, unspecified: Secondary | ICD-10-CM

## 2014-01-04 LAB — COMPREHENSIVE METABOLIC PANEL
ALBUMIN: 4.2 g/dL (ref 3.5–5.2)
ALK PHOS: 67 U/L (ref 39–117)
ALT: 46 U/L — ABNORMAL HIGH (ref 0–35)
AST: 35 U/L (ref 0–37)
BUN: 22 mg/dL (ref 6–23)
CALCIUM: 9.5 mg/dL (ref 8.4–10.5)
CHLORIDE: 104 meq/L (ref 96–112)
CO2: 27 mEq/L (ref 19–32)
Creatinine, Ser: 0.8 mg/dL (ref 0.4–1.2)
GFR: 83.45 mL/min (ref >60.00)
GLUCOSE: 84 mg/dL (ref 70–99)
POTASSIUM: 4.1 meq/L (ref 3.5–5.1)
SODIUM: 138 meq/L (ref 135–145)
Total Bilirubin: 0.6 mg/dL (ref 0.2–1.2)
Total Protein: 7.3 g/dL (ref 6.0–8.3)

## 2014-01-04 LAB — LIPID PANEL
Cholesterol: 196 mg/dL (ref 0–200)
HDL: 67.5 mg/dL (ref >39.00)
LDL Cholesterol: 114 mg/dL — ABNORMAL HIGH (ref 0–99)
Total CHOL/HDL Ratio: 3
Triglycerides: 71 mg/dL (ref 0.0–149.0)
VLDL: 14.2 mg/dL (ref 0.0–40.0)

## 2014-01-04 LAB — CBC WITH DIFFERENTIAL/PLATELET
Basophils Absolute: 0 10*3/uL (ref 0.0–0.1)
Basophils Relative: 0.7 % (ref 0.0–3.0)
EOS PCT: 6.7 % — AB (ref 0.0–5.0)
Eosinophils Absolute: 0.4 10*3/uL (ref 0.0–0.7)
HCT: 40 % (ref 36.0–46.0)
Hemoglobin: 13.5 g/dL (ref 12.0–15.0)
LYMPHS PCT: 35.1 % (ref 12.0–46.0)
Lymphs Abs: 2 10*3/uL (ref 0.7–4.0)
MCHC: 33.7 g/dL (ref 30.0–36.0)
MCV: 89.4 fl (ref 78.0–100.0)
Monocytes Absolute: 0.7 10*3/uL (ref 0.1–1.0)
Monocytes Relative: 11.5 % (ref 3.0–12.0)
NEUTROS PCT: 46 % (ref 43.0–77.0)
Neutro Abs: 2.7 10*3/uL (ref 1.4–7.7)
PLATELETS: 249 10*3/uL (ref 150.0–400.0)
RBC: 4.47 Mil/uL (ref 3.87–5.11)
RDW: 13 % (ref 11.5–15.5)
WBC: 5.8 10*3/uL (ref 4.0–10.5)

## 2014-01-04 LAB — TSH: TSH: 1.71 u[IU]/mL (ref 0.35–4.50)

## 2014-01-04 LAB — HEMOGLOBIN A1C: HEMOGLOBIN A1C: 5.5 % (ref 4.6–6.5)

## 2014-01-05 LAB — VITAMIN D 25 HYDROXY (VIT D DEFICIENCY, FRACTURES): VIT D 25 HYDROXY: 72 ng/mL (ref 30–89)

## 2014-01-09 ENCOUNTER — Ambulatory Visit (INDEPENDENT_AMBULATORY_CARE_PROVIDER_SITE_OTHER): Payer: BC Managed Care – PPO | Admitting: Family Medicine

## 2014-01-09 ENCOUNTER — Encounter: Payer: Self-pay | Admitting: Family Medicine

## 2014-01-09 VITALS — BP 118/82 | HR 61 | Temp 98.2°F | Ht 65.75 in | Wt 149.0 lb

## 2014-01-09 DIAGNOSIS — R7309 Other abnormal glucose: Secondary | ICD-10-CM

## 2014-01-09 DIAGNOSIS — E7800 Pure hypercholesterolemia, unspecified: Secondary | ICD-10-CM

## 2014-01-09 DIAGNOSIS — M899 Disorder of bone, unspecified: Secondary | ICD-10-CM

## 2014-01-09 DIAGNOSIS — M949 Disorder of cartilage, unspecified: Secondary | ICD-10-CM

## 2014-01-09 DIAGNOSIS — Z23 Encounter for immunization: Secondary | ICD-10-CM

## 2014-01-09 DIAGNOSIS — M8580 Other specified disorders of bone density and structure, unspecified site: Secondary | ICD-10-CM

## 2014-01-09 DIAGNOSIS — I10 Essential (primary) hypertension: Secondary | ICD-10-CM

## 2014-01-09 DIAGNOSIS — Z0000 Encounter for general adult medical examination without abnormal findings: Secondary | ICD-10-CM

## 2014-01-09 MED ORDER — AMLODIPINE BESYLATE 5 MG PO TABS: ORAL_TABLET | ORAL | Status: DC

## 2014-01-09 NOTE — Patient Instructions (Signed)
Tdap vaccine today (tetanus shot) Eat healthy/ keep exercising  Continue calcium and vitamin D for bone health

## 2014-01-09 NOTE — Progress Notes (Signed)
Pre visit review using our clinic review tool, if applicable. No additional management support is needed unless otherwise documented below in the visit note. 

## 2014-01-09 NOTE — Progress Notes (Signed)
Subjective:    Patient ID: Anita Buckley, female    DOB: 22-Mar-1951, 63 y.o.   MRN: [id number]  HPI Here for health maintenance exam and to review chronic medical problems    She is doing well overall   Wt is stable with bmi of 24   Td 05 -needs one today Flu shot 9/14  Had nl pap 5/13 - does not see a gyn / no problems  No symptoms  Mammogram 7/14 nl - she goes to [facility] -will make her own appt No lumps on her self exam   colonosc 1/07 - polyp and tics - 10 year recall (thinks they were hyperplastic polyps)  zostavax 10/08   Hyperglycemia Lab Results  Component Value Date   HGBA1C 5.5 01/04/2014      Chemistry      Component Value Date/Time   NA 138 01/04/2014 0922   K 4.1 01/04/2014 0922   CL 104 01/04/2014 0922   CO2 27 01/04/2014 0922   BUN 22 01/04/2014 0922   CREATININE 0.8 01/04/2014 0922      Component Value Date/Time   CALCIUM 9.5 01/04/2014 0922   ALKPHOS 67 01/04/2014 0922   AST 35 01/04/2014 0922   ALT 46* 01/04/2014 0922   BILITOT 0.6 01/04/2014 0922     no change in ALT  Does drink alcohol - no more than one drink per day   Lab Results  Component Value Date   WBC 5.8 01/04/2014   HGB 13.5 01/04/2014   HCT 40.0 01/04/2014   MCV 89.4 01/04/2014   PLT 249.0 01/04/2014    Lab Results  Component Value Date   TSH 1.71 01/04/2014   watching her thyroid nodule yearly with [place]   D level is 72  dexa 6/13  Mild osteopenia  [facility]and [facility]  No fractures  Wants to hold off a year on next dexa   bp is stable today  No cp or palpitations or headaches or edema  No side effects to medicines  BP Readings from Last 3 Encounters:  01/09/14 118/82  05/29/13 130/84  01/06/13 122/88     Lab Results  Component Value Date   CHOL 196 01/04/2014   CHOL 188 12/30/2012   CHOL 209* 06/28/2012   Lab Results  Component Value Date   HDL 67.50 01/04/2014   HDL 54.40 12/30/2012   HDL 69.70 06/28/2012   Lab Results  Component Value  Date   LDLCALC 114* 01/04/2014   LDLCALC 116* 12/30/2012   LDLCALC 116* 01/01/2012   Lab Results  Component Value Date   TRIG 71.0 01/04/2014   TRIG 89.0 12/30/2012   TRIG 77.0 06/28/2012   Lab Results  Component Value Date   CHOLHDL 3 01/04/2014   CHOLHDL 3 12/30/2012   CHOLHDL 3 06/28/2012   Lab Results  Component Value Date   LDLDIRECT 126.4 06/28/2012   LDLDIRECT 143.2 08/15/2008   LDLDIRECT 141.2 05/10/2008   overall a good profile -improved from the distant past  She takes red yeast rice   Patient Active Problem List   Diagnosis Date Noted  . Osteopenia 01/06/2013  . Left thyroid nodule 06/29/2012  . Goiter 06/28/2012  . HTN (hypertension), benign 06/28/2012  . Other screening mammogram 01/05/2012  . Gynecological examination 01/05/2012  . Post-menopause 01/05/2012  . Routine general medical examination at a health care facility 12/31/2011  . TRANSAMINASES, SERUM, ELEVATED 09/20/2008  . PURE HYPERCHOLESTEROLEMIA 08/15/2008  .  ANXIETY 12/08/2007  . DEPRESSION 03/30/2007  . FIBROCYSTIC BREAST DISEASE 03/30/2007  . HYPERGLYCEMIA 03/30/2007   Past Medical History  Diagnosis Date  . History of depression   . History of anxiety   . Situational stress   . History of hyperlipidemia   . History of fibrocystic disease of breast    Past Surgical History  Procedure Laterality Date  . Tubal ligation    . Colonoscopy  12/2005    polyps, tics   History  Substance Use Topics  . Smoking status: Never Smoker   . Smokeless tobacco: Not on file  . Alcohol Use: Yes     Comment: daily 2 glasses wine with dinner   Family History  Problem Relation Age of Onset  . Diabetes Father     late in life  . Alcohol abuse Father   . Hyperlipidemia Mother   . Mental illness Mother   . Depression Sister   . Leukemia Brother   . Schizophrenia Other     Grandfather   Allergies  Allergen Reactions  . Bee Venom     Anaphylaxis   . Buspirone Hcl     REACTION: did not like  .  Penicillins     REACTION: reaction as a child   Current Outpatient Prescriptions on File Prior to Visit  Medication Sig Dispense Refill  . ALPRAZolam (XANAX) 0.5 MG tablet Take 1 tablet (0.5 mg total) by mouth daily as needed for anxiety.  30 tablet  0  . amLODipine (NORVASC) 5 MG tablet TAKE 1 TABLET EVERY DAY  30 tablet  4  . calcium carbonate (OS-CAL) 1250 MG chewable tablet Chew 1 tablet by mouth 3 (three) times daily.      . Cholecalciferol (VITAMIN D-3) 5000 UNITS TABS Take 1 tablet by mouth daily.      Marland Kitchen EPIPEN 2-PAK 0.3 MG/0.3ML SOAJ as needed.      . famotidine (PEPCID) 10 MG tablet Take 10 mg by mouth daily.      . Red Yeast Rice Extract (RED YEAST RICE PO) Take 1 tablet by mouth daily.        No current facility-administered medications on file prior to visit.     Review of Systems Review of Systems  Constitutional: Negative for fever, appetite change, fatigue and unexpected weight change.  Eyes: Negative for pain and visual disturbance.  Respiratory: Negative for cough and shortness of breath.   Cardiovascular: Negative for cp or palpitations    Gastrointestinal: Negative for nausea, diarrhea and constipation.  Genitourinary: Negative for urgency and frequency.  Skin: Negative for pallor or rash   Neurological: Negative for weakness, light-headedness, numbness and headaches.  Hematological: Negative for adenopathy. Does not bruise/bleed easily.  Psychiatric/Behavioral: Negative for dysphoric mood. The patient is not nervous/anxious.         Objective:   Physical Exam  Constitutional: She appears well-developed and well-nourished. No distress.  HENT:  Head: Normocephalic and atraumatic.  Right Ear: External ear normal.  Left Ear: External ear normal.  Nose: Nose normal.  Mouth/Throat: Oropharynx is clear and moist.  Eyes: Conjunctivae and EOM are normal. Pupils are equal, round, and reactive to light. Right eye exhibits no discharge. Left eye exhibits no discharge.  No scleral icterus.  Neck: Normal range of motion. Neck supple. No JVD present. Thyromegaly present.  Cardiovascular: Normal rate, regular rhythm, normal heart sounds and intact distal pulses.  Exam reveals no gallop.   Pulmonary/Chest: Effort normal and breath sounds normal. No respiratory distress.  She has no wheezes. She has no rales.  Abdominal: Soft. Bowel sounds are normal. She exhibits no distension and no mass. There is no tenderness.  Genitourinary: No breast swelling, tenderness, discharge or bleeding.  Breast exam: No mass, nodules, thickening, tenderness, bulging, retraction, inflamation, nipple discharge or skin changes noted.  No axillary or clavicular LA.      Musculoskeletal: She exhibits no edema and no tenderness.  Lymphadenopathy:    She has no cervical adenopathy.  Neurological: She is alert. She has normal reflexes. No cranial nerve deficit. She exhibits normal muscle tone. Coordination normal.  Skin: Skin is warm and dry. No rash noted. No erythema. No pallor.  Psychiatric: She has a normal mood and affect.          Assessment & Plan:

## 2014-01-10 ENCOUNTER — Telehealth: Payer: Self-pay | Admitting: Family Medicine

## 2014-01-10 NOTE — Telephone Encounter (Signed)
Relevant patient education assigned to patient using Emmi. ° °

## 2014-01-11 NOTE — Assessment & Plan Note (Signed)
Reviewed health habits including diet and exercise and skin cancer prevention Reviewed appropriate screening tests for age  Also reviewed health mt list, fam hx and immunization status , as well as social and family history   Labs rev  Tdap today

## 2014-01-11 NOTE — Assessment & Plan Note (Signed)
Disc need for calcium/ vitamin D/ wt bearing exercise and bone density test every 2 y to monitor Disc safety/ fracture risk in detail   No fractures Pt wants to hold off on dexa for another year

## 2014-01-11 NOTE — Assessment & Plan Note (Signed)
bp in fair control at this time  BP Readings from Last 1 Encounters:  01/09/14 118/82   No changes needed Disc lifstyle change with low sodium diet and exercise   Labs rev

## 2014-01-11 NOTE — Assessment & Plan Note (Signed)
Disc goals for lipids and reasons to control them Rev labs with pt Rev low sat fat diet in detail   

## 2014-01-11 NOTE — Assessment & Plan Note (Signed)
Lab Results  Component Value Date   HGBA1C 5.5 01/04/2014    Good habits Will continue to monitor

## 2014-02-09 ENCOUNTER — Other Ambulatory Visit: Payer: Self-pay

## 2014-02-09 MED ORDER — AMLODIPINE BESYLATE 5 MG PO TABS: ORAL_TABLET | ORAL | Status: DC

## 2014-02-09 NOTE — Telephone Encounter (Signed)
[facility] called pt is staying there for the summer and pt request 90 day rx instead of # 30 x 11 that was transferred from local pharmacy. Advised to d/c # 30 x 11 and give # 90 x 2 for amlodipine 5 mg taking one tab by mouth daily. [facility] voiced understanding.

## 2014-05-14 ENCOUNTER — Ambulatory Visit: Payer: Self-pay | Admitting: Family Medicine

## 2014-05-14 ENCOUNTER — Encounter: Payer: Self-pay | Admitting: Family Medicine

## 2014-05-30 ENCOUNTER — Telehealth: Payer: Self-pay

## 2014-05-30 ENCOUNTER — Ambulatory Visit: Payer: Self-pay | Admitting: Oncology

## 2014-05-30 LAB — CBC CANCER CENTER
BASOS ABS: 0 x10 3/mm (ref 0.0–0.1)
BASOS PCT: 0.6 %
Eosinophil #: 0.2 x10 3/mm (ref 0.0–0.7)
Eosinophil %: 3.6 %
HCT: 40.2 % (ref 35.0–47.0)
HGB: 13.1 g/dL (ref 12.0–16.0)
LYMPHS ABS: 2.4 x10 3/mm (ref 1.0–3.6)
Lymphocyte %: 37.9 %
MCH: 29.6 pg (ref 26.0–34.0)
MCHC: 32.5 g/dL (ref 32.0–36.0)
MCV: 91 fL (ref 80–100)
Monocyte #: 0.7 x10 3/mm (ref 0.2–0.9)
Monocyte %: 10.8 %
NEUTROS PCT: 47.1 %
Neutrophil #: 3 x10 3/mm (ref 1.4–6.5)
Platelet: 233 x10 3/mm (ref 150–440)
RBC: 4.42 10*6/uL (ref 3.80–5.20)
RDW: 12.6 % (ref 11.5–14.5)
WBC: 6.4 x10 3/mm (ref 3.6–11.0)

## 2014-05-30 LAB — COMPREHENSIVE METABOLIC PANEL
ALBUMIN: 3.8 g/dL (ref 3.4–5.0)
ANION GAP: 5 — AB (ref 7–16)
Alkaline Phosphatase: 91 U/L
BUN: 18 mg/dL (ref 7–18)
Bilirubin,Total: 0.2 mg/dL (ref 0.2–1.0)
CALCIUM: 8.7 mg/dL (ref 8.5–10.1)
CHLORIDE: 105 mmol/L (ref 98–107)
CO2: 31 mmol/L (ref 21–32)
CREATININE: 0.72 mg/dL (ref 0.60–1.30)
EGFR (African American): >60
EGFR (Non-African Amer.): >60
GLUCOSE: 87 mg/dL (ref 65–99)
OSMOLALITY: 283 (ref 275–301)
POTASSIUM: 4 mmol/L (ref 3.5–5.1)
SGOT(AST): 28 U/L (ref 15–37)
SGPT (ALT): 42 U/L
Sodium: 141 mmol/L (ref 136–145)
Total Protein: 7.6 g/dL (ref 6.4–8.2)

## 2014-05-30 LAB — LACTATE DEHYDROGENASE: LDH: 166 U/L (ref 81–246)

## 2014-05-30 NOTE — Telephone Encounter (Signed)
Pt left v/m; pt has had a change in medical status; pt had lymph node biopsy with Dr Gabriel Carina on 05/15/14; biopsy tested positive for COL.Pt said  Records will be available electronically.

## 2014-05-30 NOTE — Telephone Encounter (Signed)
Thanks for the update - please send for last note from Dr Gabriel Carina when ready

## 2014-06-04 ENCOUNTER — Encounter: Payer: Self-pay | Admitting: Family Medicine

## 2014-06-04 DIAGNOSIS — C8590 Non-Hodgkin lymphoma, unspecified, unspecified site: Secondary | ICD-10-CM | POA: Insufficient documentation

## 2014-06-06 ENCOUNTER — Ambulatory Visit: Payer: Self-pay | Admitting: Oncology

## 2014-06-07 ENCOUNTER — Telehealth: Payer: Self-pay | Admitting: *Deleted

## 2014-06-07 ENCOUNTER — Encounter: Payer: Self-pay | Admitting: Family Medicine

## 2014-06-07 NOTE — Telephone Encounter (Signed)
error 

## 2014-06-17 ENCOUNTER — Ambulatory Visit: Payer: Self-pay | Admitting: Oncology

## 2014-10-24 ENCOUNTER — Other Ambulatory Visit: Payer: Self-pay | Admitting: Family Medicine

## 2014-10-24 ENCOUNTER — Ambulatory Visit
Admit: 2014-10-24 | Disposition: A | Discharge status: Home / Self Care | Payer: Self-pay | Attending: Oncology | Admitting: Oncology

## 2014-11-16 ENCOUNTER — Ambulatory Visit
Admit: 2014-11-16 | Disposition: A | Discharge status: Home / Self Care | Payer: Self-pay | Attending: Oncology | Admitting: Oncology

## 2014-11-20 ENCOUNTER — Encounter: Payer: Self-pay | Admitting: Family Medicine

## 2014-11-23 ENCOUNTER — Other Ambulatory Visit: Payer: Self-pay

## 2014-11-23 MED ORDER — EPINEPHRINE 0.3 MG/0.3ML IJ SOAJ: 0.3000 mg | INTRAMUSCULAR | Status: DC | PRN

## 2014-11-23 NOTE — Telephone Encounter (Signed)
Pt left v/m requesting refill epipen 2 pak to new pharmacy[facility] [place]. Pt request cb [phone]. Pt last CPX 01/09/14.

## 2014-11-23 NOTE — Telephone Encounter (Signed)
Will refill electronically  

## 2015-01-18 ENCOUNTER — Other Ambulatory Visit: Payer: Self-pay

## 2015-01-19 ENCOUNTER — Telehealth: Payer: Self-pay | Admitting: Family Medicine

## 2015-01-19 DIAGNOSIS — R739 Hyperglycemia, unspecified: Secondary | ICD-10-CM

## 2015-01-19 DIAGNOSIS — Z0000 Encounter for general adult medical examination without abnormal findings: Secondary | ICD-10-CM

## 2015-01-19 NOTE — Telephone Encounter (Signed)
-----   Message from Ellamae Sia sent at 01/16/2015  5:37 PM EDT ----- Regarding: Lab orders for Tuesday, 6.7.16 Patient is scheduled for CPX labs, please order future labs, Thanks , Karna Christmas

## 2015-01-21 ENCOUNTER — Encounter: Payer: BC Managed Care – PPO | Admitting: Family Medicine

## 2015-01-22 ENCOUNTER — Other Ambulatory Visit (INDEPENDENT_AMBULATORY_CARE_PROVIDER_SITE_OTHER): Payer: BLUE CROSS/BLUE SHIELD

## 2015-01-22 DIAGNOSIS — R739 Hyperglycemia, unspecified: Secondary | ICD-10-CM | POA: Diagnosis not present

## 2015-01-22 DIAGNOSIS — Z0000 Encounter for general adult medical examination without abnormal findings: Secondary | ICD-10-CM | POA: Diagnosis not present

## 2015-01-22 LAB — CBC WITH DIFFERENTIAL/PLATELET
Basophils Absolute: 0 10*3/uL (ref 0.0–0.1)
Basophils Relative: 0.4 % (ref 0.0–3.0)
EOS ABS: 0.2 10*3/uL (ref 0.0–0.7)
EOS PCT: 3.6 % (ref 0.0–5.0)
HCT: 39.5 % (ref 36.0–46.0)
HEMOGLOBIN: 13.3 g/dL (ref 12.0–15.0)
Lymphocytes Relative: 35.7 % (ref 12.0–46.0)
Lymphs Abs: 2.3 10*3/uL (ref 0.7–4.0)
MCHC: 33.6 g/dL (ref 30.0–36.0)
MCV: 87.9 fl (ref 78.0–100.0)
MONO ABS: 0.8 10*3/uL (ref 0.1–1.0)
Monocytes Relative: 11.8 % (ref 3.0–12.0)
NEUTROS ABS: 3.1 10*3/uL (ref 1.4–7.7)
Neutrophils Relative %: 48.5 % (ref 43.0–77.0)
Platelets: 228 10*3/uL (ref 150.0–400.0)
RBC: 4.49 Mil/uL (ref 3.87–5.11)
RDW: 13.6 % (ref 11.5–15.5)
WBC: 6.5 10*3/uL (ref 4.0–10.5)

## 2015-01-22 LAB — COMPREHENSIVE METABOLIC PANEL
ALT: 29 U/L (ref 0–35)
AST: 24 U/L (ref 0–37)
Albumin: 4.3 g/dL (ref 3.5–5.2)
Alkaline Phosphatase: 65 U/L (ref 39–117)
BUN: 14 mg/dL (ref 6–23)
CO2: 27 mEq/L (ref 19–32)
Calcium: 9.5 mg/dL (ref 8.4–10.5)
Chloride: 104 mEq/L (ref 96–112)
Creatinine, Ser: 0.73 mg/dL (ref 0.40–1.20)
GFR: 85.79 mL/min (ref >60.00)
Glucose, Bld: 98 mg/dL (ref 70–99)
POTASSIUM: 4.1 meq/L (ref 3.5–5.1)
Sodium: 136 mEq/L (ref 135–145)
TOTAL PROTEIN: 7.1 g/dL (ref 6.0–8.3)
Total Bilirubin: 0.5 mg/dL (ref 0.2–1.2)

## 2015-01-22 LAB — LIPID PANEL
Cholesterol: 199 mg/dL (ref 0–200)
HDL: 68.4 mg/dL (ref >39.00)
LDL CALC: 117 mg/dL — AB (ref 0–99)
NONHDL: 130.6
Total CHOL/HDL Ratio: 3
Triglycerides: 70 mg/dL (ref 0.0–149.0)
VLDL: 14 mg/dL (ref 0.0–40.0)

## 2015-01-22 LAB — HEMOGLOBIN A1C: HEMOGLOBIN A1C: 5.5 % (ref 4.6–6.5)

## 2015-01-22 LAB — TSH: TSH: 2.81 u[IU]/mL (ref 0.35–4.50)

## 2015-01-25 ENCOUNTER — Ambulatory Visit (INDEPENDENT_AMBULATORY_CARE_PROVIDER_SITE_OTHER): Payer: BLUE CROSS/BLUE SHIELD | Admitting: Family Medicine

## 2015-01-25 ENCOUNTER — Encounter: Payer: Self-pay | Admitting: Family Medicine

## 2015-01-25 VITALS — BP 122/70 | HR 65 | Temp 98.3°F | Ht 65.5 in | Wt 149.8 lb

## 2015-01-25 DIAGNOSIS — E2839 Other primary ovarian failure: Secondary | ICD-10-CM | POA: Insufficient documentation

## 2015-01-25 DIAGNOSIS — R739 Hyperglycemia, unspecified: Secondary | ICD-10-CM | POA: Diagnosis not present

## 2015-01-25 DIAGNOSIS — Z0000 Encounter for general adult medical examination without abnormal findings: Secondary | ICD-10-CM

## 2015-01-25 DIAGNOSIS — E7800 Pure hypercholesterolemia, unspecified: Secondary | ICD-10-CM

## 2015-01-25 DIAGNOSIS — I10 Essential (primary) hypertension: Secondary | ICD-10-CM

## 2015-01-25 DIAGNOSIS — M8580 Other specified disorders of bone density and structure, unspecified site: Secondary | ICD-10-CM

## 2015-01-25 DIAGNOSIS — Z1211 Encounter for screening for malignant neoplasm of colon: Secondary | ICD-10-CM

## 2015-01-25 NOTE — Progress Notes (Signed)
Pre visit review using our clinic review tool, if applicable. No additional management support is needed unless otherwise documented below in the visit note. 

## 2015-01-25 NOTE — Progress Notes (Signed)
Subjective:    Patient ID: Anita Buckley, female    DOB: Jan 16, 1951, 64 y.o.   MRN: [id number]  HPI Here for health maintenance exam and to review chronic medical problems    If feeling good  Taking care of herself  [facility]   Wt is stable with bmi of 24  HIV/hep C screen- not high risk - does not want screening   Pap 5/13 nl  No hx of persistent abn paps  No new partners -is inactive now  No bleeding  Would rather not do a pap -low risk   Mm 9/15 nl  Self exam- no lumps or changes   Flu shot- had that in the fall   colonosc 1/07- ? Benign polyp - ? [facility] , thinks she had 10 year recall   Td 5/15 Zoster vaccine 10/08  dexa 6/13 mild osteopenia (was in [place])  No falls  No fractures -in the past year  Takes her calcium and vit D  Would be interested in dexa in the fall   bp is stable today  No cp or palpitations or headaches or edema  No side effects to medicines  BP Readings from Last 3 Encounters:  01/25/15 122/70  01/09/14 118/82  05/29/13 130/84     Results for orders placed or performed in visit on 01/22/15  CBC with Differential/Platelet  Result Value Ref Range   WBC 6.5 4.0 - 10.5 K/uL   RBC 4.49 3.87 - 5.11 Mil/uL   Hemoglobin 13.3 12.0 - 15.0 g/dL   HCT 39.5 36.0 - 46.0 %   MCV 87.9 78.0 - 100.0 fl   MCHC 33.6 30.0 - 36.0 g/dL   RDW 13.6 11.5 - 15.5 %   Platelets 228.0 150.0 - 400.0 K/uL   Neutrophils Relative % 48.5 43.0 - 77.0 %   Lymphocytes Relative 35.7 12.0 - 46.0 %   Monocytes Relative 11.8 3.0 - 12.0 %   Eosinophils Relative 3.6 0.0 - 5.0 %   Basophils Relative 0.4 0.0 - 3.0 %   Neutro Abs 3.1 1.4 - 7.7 K/uL   Lymphs Abs 2.3 0.7 - 4.0 K/uL   Monocytes Absolute 0.8 0.1 - 1.0 K/uL   Eosinophils Absolute 0.2 0.0 - 0.7 K/uL   Basophils Absolute 0.0 0.0 - 0.1 K/uL  Comprehensive metabolic panel  Result Value Ref Range   Sodium 136 135 - 145 mEq/L   Potassium 4.1 3.5 - 5.1 mEq/L   Chloride 104 96  - 112 mEq/L   CO2 27 19 - 32 mEq/L   Glucose, Bld 98 70 - 99 mg/dL   BUN 14 6 - 23 mg/dL   Creatinine, Ser 0.73 0.40 - 1.20 mg/dL   Total Bilirubin 0.5 0.2 - 1.2 mg/dL   Alkaline Phosphatase 65 39 - 117 U/L   AST 24 0 - 37 U/L   ALT 29 0 - 35 U/L   Total Protein 7.1 6.0 - 8.3 g/dL   Albumin 4.3 3.5 - 5.2 g/dL   Calcium 9.5 8.4 - 10.5 mg/dL   GFR 85.79 >60.00 mL/min  Lipid panel  Result Value Ref Range   Cholesterol 199 0 - 200 mg/dL   Triglycerides 70.0 0.0 - 149.0 mg/dL   HDL 68.40 >39.00 mg/dL   VLDL 14.0 0.0 - 40.0 mg/dL   LDL Cholesterol 117 (H) 0 - 99 mg/dL   Total CHOL/HDL Ratio 3    NonHDL 130.60   TSH  Result Value Ref Range  TSH 2.81 0.35 - 4.50 uIU/mL  Hemoglobin A1c  Result Value Ref Range   Hgb A1c MFr Bld 5.5 4.6 - 6.5 %      Lab Results  Component Value Date   CHOL 199 01/22/2015   CHOL 196 01/04/2014   CHOL 188 12/30/2012   Lab Results  Component Value Date   HDL 68.40 01/22/2015   HDL 67.50 01/04/2014   HDL 54.40 12/30/2012   Lab Results  Component Value Date   LDLCALC 117* 01/22/2015   LDLCALC 114* 01/04/2014   LDLCALC 116* 12/30/2012   Lab Results  Component Value Date   TRIG 70.0 01/22/2015   TRIG 71.0 01/04/2014   TRIG 89.0 12/30/2012   Lab Results  Component Value Date   CHOLHDL 3 01/22/2015   CHOLHDL 3 01/04/2014   CHOLHDL 3 12/30/2012   Lab Results  Component Value Date   LDLDIRECT 126.4 06/28/2012   LDLDIRECT 143.2 08/15/2008   LDLDIRECT 141.2 05/10/2008   quite stable overall  Very good diet  Ratio is stable  Still takes red yeast rice   Patient Active Problem List   Diagnosis Date Noted  . Colon cancer screening 01/25/2015  . Lymphoma 06/04/2014  . Osteopenia 01/06/2013  . Left thyroid nodule 06/29/2012  . Goiter 06/28/2012  . HTN (hypertension), benign 06/28/2012  . Other screening mammogram 01/05/2012  . Gynecological examination 01/05/2012  . Post-menopause 01/05/2012  . Routine general medical examination  at a health care facility 12/31/2011  . TRANSAMINASES, SERUM, ELEVATED 09/20/2008  . PURE HYPERCHOLESTEROLEMIA 08/15/2008  . ANXIETY 12/08/2007  . DEPRESSION 03/30/2007  . FIBROCYSTIC BREAST DISEASE 03/30/2007  . Hyperglycemia 03/30/2007   Past Medical History  Diagnosis Date  . History of depression   . History of anxiety   . Situational stress   . History of hyperlipidemia   . History of fibrocystic disease of breast    Past Surgical History  Procedure Laterality Date  . Tubal ligation    . Colonoscopy  12/2005    polyps, tics   History  Substance Use Topics  . Smoking status: Never Smoker   . Smokeless tobacco: Not on file  . Alcohol Use: 0.0 oz/week    0 Standard drinks or equivalent per week     Comment: daily 2 glasses wine with dinner   Family History  Problem Relation Age of Onset  . Diabetes Father     late in life  . Alcohol abuse Father   . Hyperlipidemia Mother   . Mental illness Mother   . Depression Sister   . Leukemia Brother   . Schizophrenia Other     Grandfather   Allergies  Allergen Reactions  . Bee Venom     Anaphylaxis   . Buspirone Hcl     REACTION: did not like  . Penicillins     REACTION: reaction as a child   Current Outpatient Prescriptions on File Prior to Visit  Medication Sig Dispense Refill  . ALPRAZolam (XANAX) 0.5 MG tablet Take 1 tablet (0.5 mg total) by mouth daily as needed for anxiety. 30 tablet 0  . amLODipine (NORVASC) 5 MG tablet TAKE 1 TABLET BY MOUTH EVERY DAY 90 tablet 3  . calcium carbonate (OS-CAL) 1250 MG chewable tablet Chew 1 tablet by mouth 3 (three) times daily.    . Cholecalciferol (VITAMIN D-3) 5000 UNITS TABS Take 1 tablet by mouth daily.    Marland Kitchen EPINEPHrine (EPIPEN 2-PAK) 0.3 mg/0.3 mL IJ SOAJ injection Inject 0.3 mLs (0.3 mg  total) into the muscle as needed. 1 Device 1  . famotidine (PEPCID) 10 MG tablet Take 10 mg by mouth daily.    . Loratadine-Pseudoephedrine (LORATADINE-D 24HR PO) Take 1 tablet by mouth  daily. (takes during Yellow Jacket Season)    . Red Yeast Rice Extract (RED YEAST RICE PO) Take 1 tablet by mouth daily.      No current facility-administered medications on file prior to visit.       Review of Systems Review of Systems  Constitutional: Negative for fever, appetite change, fatigue and unexpected weight change.  Eyes: Negative for pain and visual disturbance.  Respiratory: Negative for cough and shortness of breath.   Cardiovascular: Negative for cp or palpitations    Gastrointestinal: Negative for nausea, diarrhea and constipation.  Genitourinary: Negative for urgency and frequency.  Skin: Negative for pallor or rash   Neurological: Negative for weakness, light-headedness, numbness and headaches.  Hematological: Negative for adenopathy. Does not bruise/bleed easily.  Psychiatric/Behavioral: Negative for dysphoric mood. The patient is not nervous/anxious.         Objective:   Physical Exam  Constitutional: She appears well-developed and well-nourished. No distress.  Well appearing   HENT:  Head: Normocephalic and atraumatic.  Right Ear: External ear normal.  Left Ear: External ear normal.  Mouth/Throat: Oropharynx is clear and moist.  Eyes: Conjunctivae and EOM are normal. Pupils are equal, round, and reactive to light. No scleral icterus.  Neck: Normal range of motion. Neck supple. No JVD present. Carotid bruit is not present. Thyromegaly present.  Cardiovascular: Normal rate, regular rhythm, normal heart sounds and intact distal pulses.  Exam reveals no gallop.   Pulmonary/Chest: Effort normal and breath sounds normal. No respiratory distress. She has no wheezes. She exhibits no tenderness.  Abdominal: Soft. Bowel sounds are normal. She exhibits no distension, no abdominal bruit and no mass. There is no tenderness.  Genitourinary: No breast swelling, tenderness, discharge or bleeding.  Breast exam: No mass, nodules, thickening, tenderness, bulging, retraction,  inflamation, nipple discharge or skin changes noted.  No axillary or clavicular LA.     Pt chose to put off gyn exam today  Musculoskeletal: Normal range of motion. She exhibits no edema or tenderness.  Lymphadenopathy:    She has no cervical adenopathy.  Neurological: She is alert. She has normal reflexes. No cranial nerve deficit. She exhibits normal muscle tone. Coordination normal.  Skin: Skin is warm and dry. No rash noted. No erythema. No pallor.  Psychiatric: She has a normal mood and affect.          Assessment & Plan:   Problem List Items Addressed This Visit    Colon cancer screening    Due for colonosc 2017 screening  IFOB kit ordered       Relevant Orders   Fecal occult blood, imunochemical   Estrogen deficiency    dexa ordered      Relevant Orders   DG Bone Density   HTN (hypertension), benign - Primary    bp in fair control at this time  BP Readings from Last 1 Encounters:  01/25/15 122/70   No changes needed Disc lifstyle change with low sodium diet and exercise  Labs reviewed       Hyperglycemia    Lab Results  Component Value Date   HGBA1C 5.5 01/22/2015   Stable and well controlled with diet       Osteopenia    Due for 2 y dexa/ordered No falls or fx Disc  need for calcium/ vitamin D/ wt bearing exercise and bone density test every 2 y to monitor Disc safety/ fracture risk in detail        PURE HYPERCHOLESTEROLEMIA    Disc goals for lipids and reasons to control them Rev labs with pt Rev low sat fat diet in detail        Routine general medical examination at a health care facility    Reviewed health habits including diet and exercise and skin cancer prevention Reviewed appropriate screening tests for age  Also reviewed health mt list, fam hx and immunization status , as well as social and family history   See HPI Lab reviewed Stop at check out to get referral for dexa /bone density test  Please do stool card for colon cancer  screening

## 2015-01-25 NOTE — Patient Instructions (Signed)
Take care of yourself  Stop at check out to get referral for dexa /bone density test  Please do stool card for colon cancer screening

## 2015-01-27 NOTE — Assessment & Plan Note (Signed)
Reviewed health habits including diet and exercise and skin cancer prevention Reviewed appropriate screening tests for age  Also reviewed health mt list, fam hx and immunization status , as well as social and family history   See HPI Lab reviewed Stop at check out to get referral for dexa /bone density test  Please do stool card for colon cancer screening

## 2015-01-27 NOTE — Assessment & Plan Note (Signed)
Due for colonosc 2017 screening  IFOB kit ordered

## 2015-01-27 NOTE — Assessment & Plan Note (Signed)
Disc goals for lipids and reasons to control them Rev labs with pt Rev low sat fat diet in detail   

## 2015-01-27 NOTE — Assessment & Plan Note (Signed)
dexa ordered

## 2015-01-27 NOTE — Assessment & Plan Note (Signed)
bp in fair control at this time  BP Readings from Last 1 Encounters:  01/25/15 122/70   No changes needed Disc lifstyle change with low sodium diet and exercise  Labs reviewed

## 2015-01-27 NOTE — Assessment & Plan Note (Signed)
Lab Results  Component Value Date   HGBA1C 5.5 01/22/2015   Stable and well controlled with diet

## 2015-01-27 NOTE — Assessment & Plan Note (Signed)
Due for 2 y dexa/ordered No falls or fx Disc need for calcium/ vitamin D/ wt bearing exercise and bone density test every 2 y to monitor Disc safety/ fracture risk in detail

## 2015-01-31 ENCOUNTER — Other Ambulatory Visit (INDEPENDENT_AMBULATORY_CARE_PROVIDER_SITE_OTHER): Payer: BLUE CROSS/BLUE SHIELD

## 2015-01-31 DIAGNOSIS — Z1211 Encounter for screening for malignant neoplasm of colon: Secondary | ICD-10-CM | POA: Diagnosis not present

## 2015-01-31 LAB — FECAL OCCULT BLOOD, IMMUNOCHEMICAL: Fecal Occult Bld: NEGATIVE

## 2015-04-17 ENCOUNTER — Other Ambulatory Visit: Payer: Self-pay | Admitting: *Deleted

## 2015-04-17 DIAGNOSIS — C8590 Non-Hodgkin lymphoma, unspecified, unspecified site: Secondary | ICD-10-CM

## 2015-04-24 ENCOUNTER — Encounter: Payer: Self-pay | Admitting: Oncology

## 2015-04-24 ENCOUNTER — Inpatient Hospital Stay (HOSPITAL_BASED_OUTPATIENT_CLINIC_OR_DEPARTMENT_OTHER): Payer: BLUE CROSS/BLUE SHIELD | Admitting: Oncology

## 2015-04-24 ENCOUNTER — Inpatient Hospital Stay: Payer: BLUE CROSS/BLUE SHIELD | Attending: Oncology

## 2015-04-24 VITALS — BP 133/83 | HR 61 | Temp 96.7°F | Wt 145.2 lb

## 2015-04-24 DIAGNOSIS — C8590 Non-Hodgkin lymphoma, unspecified, unspecified site: Secondary | ICD-10-CM

## 2015-04-24 DIAGNOSIS — E041 Nontoxic single thyroid nodule: Secondary | ICD-10-CM | POA: Insufficient documentation

## 2015-04-24 DIAGNOSIS — R599 Enlarged lymph nodes, unspecified: Secondary | ICD-10-CM | POA: Insufficient documentation

## 2015-04-24 DIAGNOSIS — C8300 Small cell B-cell lymphoma, unspecified site: Secondary | ICD-10-CM

## 2015-04-24 DIAGNOSIS — F329 Major depressive disorder, single episode, unspecified: Secondary | ICD-10-CM

## 2015-04-24 DIAGNOSIS — Z87898 Personal history of other specified conditions: Secondary | ICD-10-CM | POA: Diagnosis not present

## 2015-04-24 DIAGNOSIS — F419 Anxiety disorder, unspecified: Secondary | ICD-10-CM | POA: Diagnosis not present

## 2015-04-24 DIAGNOSIS — E785 Hyperlipidemia, unspecified: Secondary | ICD-10-CM | POA: Diagnosis not present

## 2015-04-24 LAB — COMPREHENSIVE METABOLIC PANEL
ALBUMIN: 4.4 g/dL (ref 3.5–5.0)
ALT: 22 U/L (ref 14–54)
AST: 25 U/L (ref 15–41)
Alkaline Phosphatase: 56 U/L (ref 38–126)
Anion gap: 6 (ref 5–15)
BUN: 18 mg/dL (ref 6–20)
CHLORIDE: 104 mmol/L (ref 101–111)
CO2: 26 mmol/L (ref 22–32)
CREATININE: 0.7 mg/dL (ref 0.44–1.00)
Calcium: 9 mg/dL (ref 8.9–10.3)
GFR calc non Af Amer: >60 mL/min (ref >60)
GLUCOSE: 99 mg/dL (ref 65–99)
Potassium: 3.7 mmol/L (ref 3.5–5.1)
SODIUM: 136 mmol/L (ref 135–145)
Total Bilirubin: 0.9 mg/dL (ref 0.3–1.2)
Total Protein: 7.2 g/dL (ref 6.5–8.1)

## 2015-04-24 LAB — CBC WITH DIFFERENTIAL/PLATELET
Basophils Absolute: 0 10*3/uL (ref 0–0.1)
Basophils Relative: 1 %
EOS PCT: 3 %
Eosinophils Absolute: 0.2 10*3/uL (ref 0–0.7)
HCT: 40.7 % (ref 35.0–47.0)
HEMOGLOBIN: 13.5 g/dL (ref 12.0–16.0)
LYMPHS ABS: 1.8 10*3/uL (ref 1.0–3.6)
Lymphocytes Relative: 32 %
MCH: 29.2 pg (ref 26.0–34.0)
MCHC: 33.3 g/dL (ref 32.0–36.0)
MCV: 87.6 fL (ref 80.0–100.0)
Monocytes Absolute: 0.6 10*3/uL (ref 0.2–0.9)
Monocytes Relative: 11 %
Neutro Abs: 3 10*3/uL (ref 1.4–6.5)
Neutrophils Relative %: 53 %
PLATELETS: 209 10*3/uL (ref 150–440)
RBC: 4.64 MIL/uL (ref 3.80–5.20)
RDW: 12.7 % (ref 11.5–14.5)
WBC: 5.6 10*3/uL (ref 3.6–11.0)

## 2015-04-24 LAB — LACTATE DEHYDROGENASE: LDH: 123 U/L (ref 98–192)

## 2015-04-24 NOTE — Progress Notes (Signed)
[facility] [phone]  [phone]     Vance Belcourt OB: 06-23-1951  MR#: [id number]  PYK#:[id number]  Patient Care Team: Abner Greenspan, MD as PCP - General  CHIEF COMPLAINT:  Chief Complaint  Patient presents with  . Follow-up   Chief Complaint/Diagnosis:   64 year old lady who had Recent needle aspiration of the thyroid nodule on the left side of the neck patient also had several enlarged lymph node on the left side of the neck aspiration revealed monoclonal population of lymphocytes CD5 positive to, CD23 positive, CD43, CD20 positive In needle aspiration was positive for malignant cells predominantly small make sure lymphocytes consistent with chronic lymphocytic leukemia or small lymphocytic lymphoma ([facility] [id number] and patient ID D [id number] ) October, 2015 Thyroid nodule aspiration was negative for malignant cells    INTERVAL HISTORY:  64 year old lady came today further follow-up.  Patient remains asymptomatic no chills no fever.  Thyroid nodule persists being followed by endocrinologist   REVIEW OF SYSTEMS:   GENERAL:  Feels good.  Active.  No fevers, sweats or weight loss. PERFORMANCE STATUS (ECOG):  0 HEENT:  No visual changes, runny nose, sore throat, mouth sores or tenderness. Lungs: No shortness of breath or cough.  No hemoptysis. Cardiac:  No chest pain, palpitations, orthopnea, or PND. GI:  No nausea, vomiting, diarrhea, constipation, melena or hematochezia. GU:  No urgency, frequency, dysuria, or hematuria. Musculoskeletal:  No back pain.  No joint pain.  No muscle tenderness. Extremities:  No pain or swelling. Skin:  No rashes or skin changes. Neuro:  No headache, numbness or weakness, balance or coordination issues. Endocrine:  No diabetes, thyroid issues, hot flashes or night sweats. Psych:  No mood changes, depression or anxiety. Pain:  No focal pain. Review of systems:  All other systems reviewed and found to  be negative. As per HPI. Otherwise, a complete review of systems is negatve.  PAST MEDICAL HISTORY: Past Medical History  Diagnosis Date  . History of depression   . History of anxiety   . Situational stress   . History of hyperlipidemia   . History of fibrocystic disease of breast     PAST SURGICAL HISTORY: Past Surgical History  Procedure Laterality Date  . Tubal ligation    . Colonoscopy  12/2005    polyps, tics    FAMILY HISTORY Family History  Problem Relation Age of Onset  . Diabetes Father     late in life  . Alcohol abuse Father   . Hyperlipidemia Mother   . Mental illness Mother   . Depression Sister   . Leukemia Brother   . Schizophrenia Other     Grandfather    ADVANCED DIRECTIVES:  No flowsheet data found.  HEALTH MAINTENANCE: Social History  Substance Use Topics  . Smoking status: Never Smoker   . Smokeless tobacco: None  . Alcohol Use: 0.0 oz/week    0 Standard drinks or equivalent per week     Comment: daily 2 glasses wine with dinner      Allergies  Allergen Reactions  . Bee Venom     Anaphylaxis   . Buspirone Hcl     REACTION: did not like  . Minocycline Nausea And Vomiting  . Penicillins     REACTION: reaction as a child    Current Outpatient Prescriptions  Medication Sig Dispense Refill  . ALPRAZolam (XANAX) 0.5 MG tablet Take 1 tablet (0.5 mg total) by mouth daily as needed for anxiety. 30 tablet  0  . amLODipine (NORVASC) 5 MG tablet TAKE 1 TABLET BY MOUTH EVERY DAY 90 tablet 3  . calcium carbonate (OS-CAL) 1250 MG chewable tablet Chew 1 tablet by mouth 3 (three) times daily.    . Cholecalciferol (VITAMIN D-3) 5000 UNITS TABS Take 1 tablet by mouth daily.    Marland Kitchen EPINEPHrine (EPIPEN 2-PAK) 0.3 mg/0.3 mL IJ SOAJ injection Inject 0.3 mLs (0.3 mg total) into the muscle as needed. 1 Device 1  . famotidine (PEPCID) 10 MG tablet Take 10 mg by mouth daily.    . Loratadine-Pseudoephedrine (LORATADINE-D 24HR PO) Take 1 tablet by mouth  daily. (takes during Yellow Jacket Season)    . Red Yeast Rice Extract (RED YEAST RICE PO) Take 1 tablet by mouth daily.      No current facility-administered medications for this visit.    OBJECTIVE:  Filed Vitals:   04/24/15 1204  BP: 133/83  Pulse: 61  Temp: 96.7 F (35.9 C)     Body mass index is 23.79 kg/(m^2).    ECOG FS:0 - Asymptomatic  PHYSICAL EXAM: Goal status: Performance status is good.  Patient has not lost significant weight HEENT: No evidence of stomatitis. Sclera and conjunctivae :: No jaundice.   pale looking. Lungs: Air  entry equal on both sides.  No rhonchi.  No rales.  Cardiac: Heart sounds are normal.  No pericardial rub.  No murmur. Lymphatic system: Palpable  small cervical lymph nodes GI: Abdomen is soft.  No ascites.  Liver spleen not palpable.  No tenderness.  Bowel sounds are within normal limit Lower extremity: No edema Neurological system: Higher functions, cranial nerves intact no evidence of peripheral neuropathy. Skin: No rash.  No ecchymosis.Marland Kitchen   LAB RESULTS:  CBC Latest Ref Rng 04/24/2015 01/22/2015  WBC 3.6 - 11.0 K/uL 5.6 6.5  Hemoglobin 12.0 - 16.0 g/dL 13.5 13.3  Hematocrit 35.0 - 47.0 % 40.7 39.5  Platelets 150 - 440 K/uL 209 228.0    Appointment on 04/24/2015  Component Date Value Ref Range Status  . WBC 04/24/2015 5.6  3.6 - 11.0 K/uL Final  . RBC 04/24/2015 4.64  3.80 - 5.20 MIL/uL Final  . Hemoglobin 04/24/2015 13.5  12.0 - 16.0 g/dL Final  . HCT 04/24/2015 40.7  35.0 - 47.0 % Final  . MCV 04/24/2015 87.6  80.0 - 100.0 fL Final  . MCH 04/24/2015 29.2  26.0 - 34.0 pg Final  . MCHC 04/24/2015 33.3  32.0 - 36.0 g/dL Final  . RDW 04/24/2015 12.7  11.5 - 14.5 % Final  . Platelets 04/24/2015 209  150 - 440 K/uL Final  . Neutrophils Relative % 04/24/2015 53   Final  . Neutro Abs 04/24/2015 3.0  1.4 - 6.5 K/uL Final  . Lymphocytes Relative 04/24/2015 32   Final  . Lymphs Abs 04/24/2015 1.8  1.0 - 3.6 K/uL Final  . Monocytes Relative  04/24/2015 11   Final  . Monocytes Absolute 04/24/2015 0.6  0.2 - 0.9 K/uL Final  . Eosinophils Relative 04/24/2015 3   Final  . Eosinophils Absolute 04/24/2015 0.2  0 - 0.7 K/uL Final  . Basophils Relative 04/24/2015 1   Final  . Basophils Absolute 04/24/2015 0.0  0 - 0.1 K/uL Final  . Sodium 04/24/2015 136  135 - 145 mmol/L Final  . Potassium 04/24/2015 3.7  3.5 - 5.1 mmol/L Final  . Chloride 04/24/2015 104  101 - 111 mmol/L Final  . CO2 04/24/2015 26  22 - 32 mmol/L Final  .  Glucose, Bld 04/24/2015 99  65 - 99 mg/dL Final  . BUN 04/24/2015 18  6 - 20 mg/dL Final  . Creatinine, Ser 04/24/2015 0.70  0.44 - 1.00 mg/dL Final  . Calcium 04/24/2015 9.0  8.9 - 10.3 mg/dL Final  . Total Protein 04/24/2015 7.2  6.5 - 8.1 g/dL Final  . Albumin 04/24/2015 4.4  3.5 - 5.0 g/dL Final  . AST 04/24/2015 25  15 - 41 U/L Final  . ALT 04/24/2015 22  14 - 54 U/L Final  . Alkaline Phosphatase 04/24/2015 56  38 - 126 U/L Final  . Total Bilirubin 04/24/2015 0.9  0.3 - 1.2 mg/dL Final  . GFR calc non Af Amer 04/24/2015 >60  >60 mL/min Final  . GFR calc Af Amer 04/24/2015 >60  >60 mL/min Final   Comment: (NOTE) The eGFR has been calculated using the CKD EPI equation. This calculation has not been validated in all clinical situations. eGFR's persistently <60 mL/min signify possible Chronic Kidney Disease.   . Anion gap 04/24/2015 6  5 - 15 Final  . LDH 04/24/2015 123  98 - 192 U/L Final      ASSESSMENT: Small lymphocytic lymphoma asymptomatic Hemoglobin and platelet count and white count stable   MEDICAL DECISION MAKING:  Your observation.  Repeat PET scan for evaluation of lymph nodes in next 4 months Patient expressed understanding and was in agreement with this plan. She also understands that She can call clinic at any time with any questions, concerns, or complaints.    No matching staging information was found for the patient.  Forest Gleason, MD   04/24/2015 12:24 PM

## 2015-04-24 NOTE — Progress Notes (Signed)
Patient does have living will.  Former smoker. Patient complains of more pressure on her esophagus.

## 2015-04-28 ENCOUNTER — Encounter: Payer: Self-pay | Admitting: Oncology

## 2015-06-04 ENCOUNTER — Ambulatory Visit: Payer: BLUE CROSS/BLUE SHIELD

## 2015-06-05 ENCOUNTER — Other Ambulatory Visit: Payer: Self-pay

## 2015-06-05 MED ORDER — ALPRAZOLAM 0.5 MG PO TABS: 0.5000 mg | ORAL_TABLET | Freq: Every day | ORAL | Status: DC | PRN

## 2015-06-05 NOTE — Telephone Encounter (Signed)
Rx called in as prescribed 

## 2015-06-05 NOTE — Telephone Encounter (Signed)
Last filled 10/31/2012---pt wants called into [place] phone [phone] advise

## 2015-06-05 NOTE — Telephone Encounter (Signed)
Px written for call in   

## 2015-07-02 ENCOUNTER — Ambulatory Visit
Admission: RE | Admit: 2015-07-02 | Discharge: 2015-07-02 | Disposition: A | Discharge status: Home / Self Care | Payer: BLUE CROSS/BLUE SHIELD | Source: Ambulatory Visit | Attending: Family Medicine | Admitting: Family Medicine

## 2015-07-02 DIAGNOSIS — Z780 Asymptomatic menopausal state: Secondary | ICD-10-CM | POA: Diagnosis not present

## 2015-07-02 DIAGNOSIS — E2839 Other primary ovarian failure: Secondary | ICD-10-CM | POA: Diagnosis present

## 2015-08-05 ENCOUNTER — Ambulatory Visit
Admission: RE | Admit: 2015-08-05 | Discharge: 2015-08-05 | Disposition: A | Discharge status: Home / Self Care | Payer: BLUE CROSS/BLUE SHIELD | Source: Ambulatory Visit | Attending: Oncology | Admitting: Oncology

## 2015-08-05 DIAGNOSIS — N200 Calculus of kidney: Secondary | ICD-10-CM | POA: Insufficient documentation

## 2015-08-05 DIAGNOSIS — K8020 Calculus of gallbladder without cholecystitis without obstruction: Secondary | ICD-10-CM | POA: Insufficient documentation

## 2015-08-05 DIAGNOSIS — K760 Fatty (change of) liver, not elsewhere classified: Secondary | ICD-10-CM | POA: Diagnosis not present

## 2015-08-05 DIAGNOSIS — C8590 Non-Hodgkin lymphoma, unspecified, unspecified site: Secondary | ICD-10-CM | POA: Diagnosis not present

## 2015-08-05 LAB — GLUCOSE, CAPILLARY: Glucose-Capillary: 93 mg/dL (ref 65–99)

## 2015-08-05 MED ORDER — FLUDEOXYGLUCOSE F - 18 (FDG) INJECTION
12.0000 | Freq: Once | INTRAVENOUS | Status: AC | PRN
  Administered 2015-08-05: 12 via INTRAVENOUS

## 2015-08-06 ENCOUNTER — Ambulatory Visit (INDEPENDENT_AMBULATORY_CARE_PROVIDER_SITE_OTHER): Payer: BLUE CROSS/BLUE SHIELD | Admitting: Family Medicine

## 2015-08-06 ENCOUNTER — Encounter: Payer: Self-pay | Admitting: *Deleted

## 2015-08-06 ENCOUNTER — Encounter: Payer: Self-pay | Admitting: Family Medicine

## 2015-08-06 VITALS — BP 136/84 | HR 72 | Temp 98.4°F | Ht 65.5 in | Wt 151.5 lb

## 2015-08-06 DIAGNOSIS — M8580 Other specified disorders of bone density and structure, unspecified site: Secondary | ICD-10-CM | POA: Diagnosis not present

## 2015-08-06 MED ORDER — ALENDRONATE SODIUM 70 MG PO TABS: 70.0000 mg | ORAL_TABLET | ORAL | Status: DC

## 2015-08-06 NOTE — Progress Notes (Signed)
Pre visit review using our clinic review tool, if applicable. No additional management support is needed unless otherwise documented below in the visit note. 

## 2015-08-06 NOTE — Patient Instructions (Signed)
Start alendronate (fosamax) once weekly as directed  Take your calcium and D and keep exercising  We can check another bone density test in 2 years  If side effects - let us know

## 2015-08-06 NOTE — Progress Notes (Signed)
Subjective:    Patient ID: Anita Buckley, female    DOB: 11-13-1950, 64 y.o.   MRN: [id number]  HPI Here for f/u to disc dexa   Osteopenia LS T score -1.5  (down from -0.7) FN T score -2.0 (down from -1.2)  frax - overall op fx risk 31% and hip 2.7%  Hx of fx personal R foot fracture - about 5 years ago (top of her R foot)- after a hard fall (not a fragility fx)   Quit smoking 10 years ago   Hx of OP in family- grandmother had kyphosis  Mother had a hip fracture   Takes oscal tid Takes 5000 iu D3 daily  Is more compliant now  And also eats yogurt   Exercise - [facility] Also starting Tai chi  Also some small free weights   Has not been on medication   Post menopausal   No hx of jaw tumors  No upcoming major dental work    Patient Active Problem List   Diagnosis Date Noted  . Colon cancer screening 01/25/2015  . Estrogen deficiency 01/25/2015  . Lymphoma ([place]) 06/04/2014  . Osteopenia 01/06/2013  . Left thyroid nodule 06/29/2012  . Goiter 06/28/2012  . HTN (hypertension), benign 06/28/2012  . Other screening mammogram 01/05/2012  . Gynecological examination 01/05/2012  . Post-menopause 01/05/2012  . Routine general medical examination at a health care facility 12/31/2011  . TRANSAMINASES, SERUM, ELEVATED 09/20/2008  . PURE HYPERCHOLESTEROLEMIA 08/15/2008  . ANXIETY 12/08/2007  . DEPRESSION 03/30/2007  . FIBROCYSTIC BREAST DISEASE 03/30/2007  . Hyperglycemia 03/30/2007   Past Medical History  Diagnosis Date  . History of depression   . History of anxiety   . Situational stress   . History of hyperlipidemia   . History of fibrocystic disease of breast    Past Surgical History  Procedure Laterality Date  . Tubal ligation    . Colonoscopy  12/2005    polyps, tics   Social History  Substance Use Topics  . Smoking status: Never Smoker   . Smokeless tobacco: None  . Alcohol Use: 0.0 oz/week    0 Standard drinks or  equivalent per week     Comment: daily 2 glasses wine with dinner   Family History  Problem Relation Age of Onset  . Diabetes Father     late in life  . Alcohol abuse Father   . Hyperlipidemia Mother   . Mental illness Mother   . Depression Sister   . Leukemia Brother   . Schizophrenia Other     Grandfather   Allergies  Allergen Reactions  . Bee Venom     Anaphylaxis   . Buspirone Hcl     REACTION: did not like  . Minocycline Nausea And Vomiting  . Penicillins     REACTION: reaction as a child   Current Outpatient Prescriptions on File Prior to Visit  Medication Sig Dispense Refill  . ALPRAZolam (XANAX) 0.5 MG tablet Take 1 tablet (0.5 mg total) by mouth daily as needed for anxiety. 30 tablet 3  . amLODipine (NORVASC) 5 MG tablet TAKE 1 TABLET BY MOUTH EVERY DAY 90 tablet 3  . calcium carbonate (OS-CAL) 1250 MG chewable tablet Chew 1 tablet by mouth 3 (three) times daily.    . Cholecalciferol (VITAMIN D-3) 5000 UNITS TABS Take 1 tablet by mouth daily.    Marland Kitchen EPINEPHrine (EPIPEN 2-PAK) 0.3 mg/0.3 mL IJ SOAJ injection Inject 0.3 mLs (0.3 mg  total) into the muscle as needed. 1 Device 1  . famotidine (PEPCID) 10 MG tablet Take 10 mg by mouth daily.    . Loratadine-Pseudoephedrine (LORATADINE-D 24HR PO) Take 1 tablet by mouth daily. (takes during Yellow Jacket Season)    . Red Yeast Rice Extract (RED YEAST RICE PO) Take 1 tablet by mouth daily.      No current facility-administered medications on file prior to visit.    Review of Systems Review of Systems  Constitutional: Negative for fever, appetite change, fatigue and unexpected weight change.  Eyes: Negative for pain and visual disturbance.  Respiratory: Negative for cough and shortness of breath.   Cardiovascular: Negative for cp or palpitations    Gastrointestinal: Negative for nausea, diarrhea and constipation.  Genitourinary: Negative for urgency and frequency.  Skin: Negative for pallor or rash   Neurological:  Negative for weakness, light-headedness, numbness and headaches.  Hematological: Negative for adenopathy. Does not bruise/bleed easily.  Psychiatric/Behavioral: Negative for dysphoric mood. The patient is not nervous/anxious.         Objective:   Physical Exam  Constitutional: She appears well-developed and well-nourished.  HENT:  Head: Normocephalic and atraumatic.  Eyes: Conjunctivae and EOM are normal. Pupils are equal, round, and reactive to light. No scleral icterus.  Neck: Normal range of motion. Neck supple. Thyromegaly present.  Cardiovascular: Normal rate and regular rhythm.   Pulmonary/Chest: Effort normal and breath sounds normal. No respiratory distress. She has no wheezes. She has no rales.  Musculoskeletal: She exhibits no edema.  No kyphosis or acute joint changes   Lymphadenopathy:    She has no cervical adenopathy.  Neurological: She is alert. She has normal reflexes.  Skin: Skin is warm and dry. No pallor.  Psychiatric: She has a normal mood and affect.          Assessment & Plan:   Problem List Items Addressed This Visit      Musculoskeletal and Integument   Osteopenia - Primary    Post menopausal female with personal fx hx, fam hx of op and fx On H2 blocker  Prev smoker  T scores down significantly  Disc opt for tx-will try alendronate 70 weekly - for 5 y  Rev poss side eff (esp GI) in detail/ as well as risk of jaw tumors  Update if side eff  Re check dexa 2 y Continue ca/ D and exercise Handout given

## 2015-08-06 NOTE — Assessment & Plan Note (Signed)
Post menopausal female with personal fx hx, fam hx of op and fx On H2 blocker  Prev smoker  T scores down significantly  Disc opt for tx-will try alendronate 70 weekly - for 5 y  Rev poss side eff (esp GI) in detail/ as well as risk of jaw tumors  Update if side eff  Re check dexa 2 y Continue ca/ D and exercise Handout given

## 2015-08-07 ENCOUNTER — Inpatient Hospital Stay (HOSPITAL_BASED_OUTPATIENT_CLINIC_OR_DEPARTMENT_OTHER): Payer: BLUE CROSS/BLUE SHIELD | Admitting: Oncology

## 2015-08-07 ENCOUNTER — Inpatient Hospital Stay: Payer: BLUE CROSS/BLUE SHIELD | Attending: Oncology

## 2015-08-07 ENCOUNTER — Encounter: Payer: Self-pay | Admitting: Oncology

## 2015-08-07 VITALS — BP 129/86 | HR 72 | Temp 97.0°F | Wt 147.7 lb

## 2015-08-07 DIAGNOSIS — C8590 Non-Hodgkin lymphoma, unspecified, unspecified site: Secondary | ICD-10-CM | POA: Insufficient documentation

## 2015-08-07 DIAGNOSIS — Z872 Personal history of diseases of the skin and subcutaneous tissue: Secondary | ICD-10-CM | POA: Diagnosis not present

## 2015-08-07 DIAGNOSIS — Z8659 Personal history of other mental and behavioral disorders: Secondary | ICD-10-CM | POA: Insufficient documentation

## 2015-08-07 DIAGNOSIS — Z79899 Other long term (current) drug therapy: Secondary | ICD-10-CM | POA: Insufficient documentation

## 2015-08-07 DIAGNOSIS — E041 Nontoxic single thyroid nodule: Secondary | ICD-10-CM

## 2015-08-07 DIAGNOSIS — E785 Hyperlipidemia, unspecified: Secondary | ICD-10-CM | POA: Diagnosis not present

## 2015-08-07 DIAGNOSIS — C8511 Unspecified B-cell lymphoma, lymph nodes of head, face, and neck: Secondary | ICD-10-CM

## 2015-08-07 LAB — COMPREHENSIVE METABOLIC PANEL
ALBUMIN: 4.5 g/dL (ref 3.5–5.0)
ALT: 40 U/L (ref 14–54)
AST: 32 U/L (ref 15–41)
Alkaline Phosphatase: 60 U/L (ref 38–126)
Anion gap: 10 (ref 5–15)
BILIRUBIN TOTAL: 1.1 mg/dL (ref 0.3–1.2)
BUN: 20 mg/dL (ref 6–20)
CO2: 27 mmol/L (ref 22–32)
Calcium: 9.5 mg/dL (ref 8.9–10.3)
Chloride: 99 mmol/L — ABNORMAL LOW (ref 101–111)
Creatinine, Ser: 0.72 mg/dL (ref 0.44–1.00)
GFR calc Af Amer: >60 mL/min (ref >60)
GFR calc non Af Amer: >60 mL/min (ref >60)
GLUCOSE: 106 mg/dL — AB (ref 65–99)
POTASSIUM: 3.8 mmol/L (ref 3.5–5.1)
Sodium: 136 mmol/L (ref 135–145)
TOTAL PROTEIN: 7.4 g/dL (ref 6.5–8.1)

## 2015-08-07 LAB — CBC WITH DIFFERENTIAL/PLATELET
BASOS ABS: 0 10*3/uL (ref 0–0.1)
BASOS PCT: 1 %
Eosinophils Absolute: 0.2 10*3/uL (ref 0–0.7)
Eosinophils Relative: 3 %
HEMATOCRIT: 41.1 % (ref 35.0–47.0)
HEMOGLOBIN: 13.7 g/dL (ref 12.0–16.0)
LYMPHS PCT: 25 %
Lymphs Abs: 1.5 10*3/uL (ref 1.0–3.6)
MCH: 29.2 pg (ref 26.0–34.0)
MCHC: 33.3 g/dL (ref 32.0–36.0)
MCV: 87.8 fL (ref 80.0–100.0)
MONO ABS: 0.7 10*3/uL (ref 0.2–0.9)
Monocytes Relative: 11 %
NEUTROS ABS: 3.7 10*3/uL (ref 1.4–6.5)
Neutrophils Relative %: 60 %
Platelets: 206 10*3/uL (ref 150–440)
RBC: 4.68 MIL/uL (ref 3.80–5.20)
RDW: 12.7 % (ref 11.5–14.5)
WBC: 6.1 10*3/uL (ref 3.6–11.0)

## 2015-08-07 LAB — LACTATE DEHYDROGENASE: LDH: 138 U/L (ref 98–192)

## 2015-08-07 NOTE — Progress Notes (Signed)
[place] @ [facility] [phone]  [phone]     Anita Buckley OB: May 08, 1951  MR#: [id number]  PYK#:[id number]  Patient Care Team: Anita Greenspan, MD as PCP - General  CHIEF COMPLAINT:  Chief Complaint  Patient presents with  . Lymphoma   Chief Complaint/Diagnosis:   64 year old lady who had Recent needle aspiration of the thyroid nodule on the left side of the neck patient also had several enlarged lymph node on the left side of the neck aspiration revealed monoclonal population of lymphocytes CD5 positive to, CD23 positive, CD43, CD20 positive In needle aspiration was positive for malignant cells predominantly small make sure lymphocytes consistent with chronic lymphocytic leukemia or small lymphocytic lymphoma ([facility] [id number] and patient ID D [id number] ) October, 2015 Thyroid nodule aspiration was negative for malignant cells    INTERVAL HISTORY:  64 year old lady came today further follow-up.  Patient remains asymptomatic no chills no fever.  Thyroid nodule persists being followed by endocrinologist  She t is here for further follow-up regarding small lymphocytic lymphoma.  B cells. Since last evaluation she has not been to emergency room said did not have any intercurrent infection.  She   has a repeat CT scan and then PET scan done REVIEW OF SYSTEMS:   GENERAL:  Feels good.  Active.  No fevers, sweats or weight loss. PERFORMANCE STATUS (ECOG):  0 HEENT:  No visual changes, runny nose, sore throat, mouth sores or tenderness. Lungs: No shortness of breath or cough.  No hemoptysis. Cardiac:  No chest pain, palpitations, orthopnea, or PND. GI:  No nausea, vomiting, diarrhea, constipation, melena or hematochezia. GU:  No urgency, frequency, dysuria, or hematuria. Musculoskeletal:  No back pain.  No joint pain.  No muscle tenderness. Extremities:  No pain or swelling. Skin:  No rashes or skin changes. Neuro:  No headache, numbness or weakness,  balance or coordination issues. Endocrine:  No diabetes, thyroid issues, hot flashes or night sweats. Psych:  No mood changes, depression or anxiety. Pain:  No focal pain. Review of systems:  All other systems reviewed and found to be negative. As per HPI. Otherwise, a complete review of systems is negatve.  PAST MEDICAL HISTORY: Past Medical History  Diagnosis Date  . History of depression   . History of anxiety   . Situational stress   . History of hyperlipidemia   . History of fibrocystic disease of breast     PAST SURGICAL HISTORY: Past Surgical History  Procedure Laterality Date  . Tubal ligation    . Colonoscopy  12/2005    polyps, tics    FAMILY HISTORY Family History  Problem Relation Age of Onset  . Diabetes Father     late in life  . Alcohol abuse Father   . Hyperlipidemia Mother   . Mental illness Mother   . Depression Sister   . Leukemia Brother   . Schizophrenia Other     Grandfather    ADVANCED DIRECTIVES:  No flowsheet data found.  HEALTH MAINTENANCE: Social History  Substance Use Topics  . Smoking status: Never Smoker   . Smokeless tobacco: None  . Alcohol Use: 0.0 oz/week    0 Standard drinks or equivalent per week     Comment: daily 2 glasses wine with dinner      Allergies  Allergen Reactions  . Bee Venom     Anaphylaxis   . Buspirone Hcl     REACTION: did not like  . Minocycline Nausea And Vomiting  .  Penicillins     REACTION: reaction as a child    Current Outpatient Prescriptions  Medication Sig Dispense Refill  . ALPRAZolam (XANAX) 0.5 MG tablet Take 1 tablet (0.5 mg total) by mouth daily as needed for anxiety. 30 tablet 3  . amLODipine (NORVASC) 5 MG tablet TAKE 1 TABLET BY MOUTH EVERY DAY 90 tablet 3  . calcium carbonate (OS-CAL) 1250 MG chewable tablet Chew 1 tablet by mouth 3 (three) times daily.    . Cholecalciferol (VITAMIN D-3) 5000 UNITS TABS Take 1 tablet by mouth daily.    Anita Buckley Kitchen EPINEPHrine (EPIPEN 2-PAK) 0.3 mg/0.3  mL IJ SOAJ injection Inject 0.3 mLs (0.3 mg total) into the muscle as needed. 1 Device 1  . famotidine (PEPCID) 10 MG tablet Take 10 mg by mouth daily.    . Loratadine-Pseudoephedrine (LORATADINE-D 24HR PO) Take 1 tablet by mouth daily. (takes during Yellow Jacket Season)    . Red Yeast Rice Extract (RED YEAST RICE PO) Take 1 tablet by mouth daily.     Anita Buckley Kitchen alendronate (FOSAMAX) 70 MG tablet Take 1 tablet (70 mg total) by mouth once a week. Take with a full glass of water on an empty stomach. 4 tablet 11   No current facility-administered medications for this visit.    OBJECTIVE:  Filed Vitals:   08/07/15 1045  BP: 129/86  Pulse: 72  Temp: 97 F (36.1 C)     Body mass index is 24.2 kg/(m^2).    ECOG FS:0 - Asymptomatic  PHYSICAL EXAM: Goal status: Performance status is good.  Patient has not lost significant weight HEENT: No evidence of stomatitis. Sclera and conjunctivae :: No jaundice.   pale looking. Lungs: Air  entry equal on both sides.  No rhonchi.  No rales.  Cardiac: Heart sounds are normal.  No pericardial rub.  No murmur. Lymphatic system: Palpable  small cervical lymph nodes GI: Abdomen is soft.  No ascites.  Liver spleen not palpable.  No tenderness.  Bowel sounds are within normal limit Lower extremity: No edema Neurological system: Higher functions, cranial nerves intact no evidence of peripheral neuropathy. Skin: No rash.  No ecchymosis.Anita Buckley Kitchen   LAB RESULTS:  CBC Latest Ref Rng 08/07/2015 04/24/2015  WBC 3.6 - 11.0 K/uL 6.1 5.6  Hemoglobin 12.0 - 16.0 g/dL 13.7 13.5  Hematocrit 35.0 - 47.0 % 41.1 40.7  Platelets 150 - 440 K/uL 206 209    Appointment on 08/07/2015  Component Date Value Ref Range Status  . WBC 08/07/2015 6.1  3.6 - 11.0 K/uL Final  . RBC 08/07/2015 4.68  3.80 - 5.20 MIL/uL Final  . Hemoglobin 08/07/2015 13.7  12.0 - 16.0 g/dL Final  . HCT 08/07/2015 41.1  35.0 - 47.0 % Final  . MCV 08/07/2015 87.8  80.0 - 100.0 fL Final  . MCH 08/07/2015 29.2  26.0 -  34.0 pg Final  . MCHC 08/07/2015 33.3  32.0 - 36.0 g/dL Final  . RDW 08/07/2015 12.7  11.5 - 14.5 % Final  . Platelets 08/07/2015 206  150 - 440 K/uL Final  . Neutrophils Relative % 08/07/2015 60   Final  . Neutro Abs 08/07/2015 3.7  1.4 - 6.5 K/uL Final  . Lymphocytes Relative 08/07/2015 25   Final  . Lymphs Abs 08/07/2015 1.5  1.0 - 3.6 K/uL Final  . Monocytes Relative 08/07/2015 11   Final  . Monocytes Absolute 08/07/2015 0.7  0.2 - 0.9 K/uL Final  . Eosinophils Relative 08/07/2015 3   Final  . Eosinophils Absolute  08/07/2015 0.2  0 - 0.7 K/uL Final  . Basophils Relative 08/07/2015 1   Final  . Basophils Absolute 08/07/2015 0.0  0 - 0.1 K/uL Final  . Sodium 08/07/2015 136  135 - 145 mmol/L Final  . Potassium 08/07/2015 3.8  3.5 - 5.1 mmol/L Final  . Chloride 08/07/2015 99* 101 - 111 mmol/L Final  . CO2 08/07/2015 27  22 - 32 mmol/L Final  . Glucose, Bld 08/07/2015 106* 65 - 99 mg/dL Final  . BUN 08/07/2015 20  6 - 20 mg/dL Final  . Creatinine, Ser 08/07/2015 0.72  0.44 - 1.00 mg/dL Final  . Calcium 08/07/2015 9.5  8.9 - 10.3 mg/dL Final  . Total Protein 08/07/2015 7.4  6.5 - 8.1 g/dL Final  . Albumin 08/07/2015 4.5  3.5 - 5.0 g/dL Final  . AST 08/07/2015 32  15 - 41 U/L Final  . ALT 08/07/2015 40  14 - 54 U/L Final  . Alkaline Phosphatase 08/07/2015 60  38 - 126 U/L Final  . Total Bilirubin 08/07/2015 1.1  0.3 - 1.2 mg/dL Final  . GFR calc non Af Amer 08/07/2015 >60  >60 mL/min Final  . GFR calc Af Amer 08/07/2015 >60  >60 mL/min Final   Comment: (NOTE) The eGFR has been calculated using the CKD EPI equation. This calculation has not been validated in all clinical situations. eGFR's persistently <60 mL/min signify possible Chronic Kidney Disease.   . Anion gap 08/07/2015 10  5 - 15 Final  . LDH 08/07/2015 138  98 - 192 U/L Final  Hospital Outpatient Visit on 08/05/2015  Component Date Value Ref Range Status  . Glucose-Capillary 08/05/2015 93  65 - 99 mg/dL Final       ASSESSMENT: Small lymphocytic lymphoma asymptomatic Hemoglobin and platelet count and white count stable All lab data has been reviewed.  MEDICAL DECISION MAKING:  PET scan has been reviewed independently and reviewed with the patient and family There is only one small increase uptake in the left cervical lymph node.  2.  Thyroid nodule needs to be followed outpatient desires ENT evaluation    No matching staging information was found for the patient.  Forest Gleason, MD   08/07/2015 11:20 AM

## 2015-08-07 NOTE — Progress Notes (Signed)
[place] @ [facility] [phone]  [phone]     Anita Buckley OB: [birth year]  MR#: [id number]  BMW#:[id number]  Patient Care Team: Abner Greenspan, MD as PCP - General  CHIEF COMPLAINT:  Chief Complaint  Patient presents with  . Lymphoma   Chief Complaint/Diagnosis:   64 year old lady who had Recent needle aspiration of the thyroid nodule on the left side of the neck patient also had several enlarged lymph node on the left side of the neck aspiration revealed monoclonal population of lymphocytes CD5 positive to, CD23 positive, CD43, CD20 positive In needle aspiration was positive for malignant cells predominantly small make sure lymphocytes consistent with chronic lymphocytic leukemia or small lymphocytic lymphoma ([facility] [id number] and patient ID D [id number] ) October, 2015 Thyroid nodule aspiration was negative for malignant cells    INTERVAL HISTORY:  64 year old lady came today further follow-up.  Patient remains asymptomatic no chills no fever.  Thyroid nodule persists being followed by endocrinologist   REVIEW OF SYSTEMS:   GENERAL:  Feels good.  Active.  No fevers, sweats or weight loss. PERFORMANCE STATUS (ECOG):  0 HEENT:  No visual changes, runny nose, sore throat, mouth sores or tenderness. Lungs: No shortness of breath or cough.  No hemoptysis. Cardiac:  No chest pain, palpitations, orthopnea, or PND. GI:  No nausea, vomiting, diarrhea, constipation, melena or hematochezia. GU:  No urgency, frequency, dysuria, or hematuria. Musculoskeletal:  No back pain.  No joint pain.  No muscle tenderness. Extremities:  No pain or swelling. Skin:  No rashes or skin changes. Neuro:  No headache, numbness or weakness, balance or coordination issues. Endocrine:  No diabetes, thyroid issues, hot flashes or night sweats. Psych:  No mood changes, depression or anxiety. Pain:  No focal pain. Review of systems:  All other systems reviewed and found to be  negative. As per HPI. Otherwise, a complete review of systems is negatve.  PAST MEDICAL HISTORY: Past Medical History  Diagnosis Date  . History of depression   . History of anxiety   . Situational stress   . History of hyperlipidemia   . History of fibrocystic disease of breast     PAST SURGICAL HISTORY: Past Surgical History  Procedure Laterality Date  . Tubal ligation    . Colonoscopy  12/2005    polyps, tics    FAMILY HISTORY Family History  Problem Relation Age of Onset  . Diabetes Father     late in life  . Alcohol abuse Father   . Hyperlipidemia Mother   . Mental illness Mother   . Depression Sister   . Leukemia Brother   . Schizophrenia Other     Grandfather    ADVANCED DIRECTIVES:  No flowsheet data found.  HEALTH MAINTENANCE: Social History  Substance Use Topics  . Smoking status: Never Smoker   . Smokeless tobacco: None  . Alcohol Use: 0.0 oz/week    0 Standard drinks or equivalent per week     Comment: daily 2 glasses wine with dinner      Allergies  Allergen Reactions  . Bee Venom     Anaphylaxis   . Buspirone Hcl     REACTION: did not like  . Minocycline Nausea And Vomiting  . Penicillins     REACTION: reaction as a child    Current Outpatient Prescriptions  Medication Sig Dispense Refill  . ALPRAZolam (XANAX) 0.5 MG tablet Take 1 tablet (0.5 mg total) by mouth daily as needed for anxiety. 30 tablet  3  . amLODipine (NORVASC) 5 MG tablet TAKE 1 TABLET BY MOUTH EVERY DAY 90 tablet 3  . calcium carbonate (OS-CAL) 1250 MG chewable tablet Chew 1 tablet by mouth 3 (three) times daily.    . Cholecalciferol (VITAMIN D-3) 5000 UNITS TABS Take 1 tablet by mouth daily.    Marland Kitchen EPINEPHrine (EPIPEN 2-PAK) 0.3 mg/0.3 mL IJ SOAJ injection Inject 0.3 mLs (0.3 mg total) into the muscle as needed. 1 Device 1  . famotidine (PEPCID) 10 MG tablet Take 10 mg by mouth daily.    . Loratadine-Pseudoephedrine (LORATADINE-D 24HR PO) Take 1 tablet by mouth daily.  (takes during Yellow Jacket Season)    . Red Yeast Rice Extract (RED YEAST RICE PO) Take 1 tablet by mouth daily.     Marland Kitchen alendronate (FOSAMAX) 70 MG tablet Take 1 tablet (70 mg total) by mouth once a week. Take with a full glass of water on an empty stomach. 4 tablet 11   No current facility-administered medications for this visit.    OBJECTIVE:  Filed Vitals:   08/07/15 1045  BP: 129/86  Pulse: 72  Temp: 97 F (36.1 C)     Body mass index is 24.2 kg/(m^2).    ECOG FS:0 - Asymptomatic  PHYSICAL EXAM: Goal status: Performance status is good.  Patient has not lost significant weight HEENT: No evidence of stomatitis. Sclera and conjunctivae :: No jaundice.   pale looking. Lungs: Air  entry equal on both sides.  No rhonchi.  No rales.  Cardiac: Heart sounds are normal.  No pericardial rub.  No murmur. Lymphatic system: Palpable  small cervical lymph nodes GI: Abdomen is soft.  No ascites.  Liver spleen not palpable.  No tenderness.  Bowel sounds are within normal limit Lower extremity: No edema Neurological system: Higher functions, cranial nerves intact no evidence of peripheral neuropathy. Skin: No rash.  No ecchymosis.Marland Kitchen   LAB RESULTS:  CBC Latest Ref Rng 08/07/2015 04/24/2015  WBC 3.6 - 11.0 K/uL 6.1 5.6  Hemoglobin 12.0 - 16.0 g/dL 13.7 13.5  Hematocrit 35.0 - 47.0 % 41.1 40.7  Platelets 150 - 440 K/uL 206 209    Appointment on 08/07/2015  Component Date Value Ref Range Status  . WBC 08/07/2015 6.1  3.6 - 11.0 K/uL Final  . RBC 08/07/2015 4.68  3.80 - 5.20 MIL/uL Final  . Hemoglobin 08/07/2015 13.7  12.0 - 16.0 g/dL Final  . HCT 08/07/2015 41.1  35.0 - 47.0 % Final  . MCV 08/07/2015 87.8  80.0 - 100.0 fL Final  . MCH 08/07/2015 29.2  26.0 - 34.0 pg Final  . MCHC 08/07/2015 33.3  32.0 - 36.0 g/dL Final  . RDW 08/07/2015 12.7  11.5 - 14.5 % Final  . Platelets 08/07/2015 206  150 - 440 K/uL Final  . Neutrophils Relative % 08/07/2015 60   Final  . Neutro Abs 08/07/2015 3.7   1.4 - 6.5 K/uL Final  . Lymphocytes Relative 08/07/2015 25   Final  . Lymphs Abs 08/07/2015 1.5  1.0 - 3.6 K/uL Final  . Monocytes Relative 08/07/2015 11   Final  . Monocytes Absolute 08/07/2015 0.7  0.2 - 0.9 K/uL Final  . Eosinophils Relative 08/07/2015 3   Final  . Eosinophils Absolute 08/07/2015 0.2  0 - 0.7 K/uL Final  . Basophils Relative 08/07/2015 1   Final  . Basophils Absolute 08/07/2015 0.0  0 - 0.1 K/uL Final  . Sodium 08/07/2015 136  135 - 145 mmol/L Final  . Potassium  08/07/2015 3.8  3.5 - 5.1 mmol/L Final  . Chloride 08/07/2015 99* 101 - 111 mmol/L Final  . CO2 08/07/2015 27  22 - 32 mmol/L Final  . Glucose, Bld 08/07/2015 106* 65 - 99 mg/dL Final  . BUN 08/07/2015 20  6 - 20 mg/dL Final  . Creatinine, Ser 08/07/2015 0.72  0.44 - 1.00 mg/dL Final  . Calcium 08/07/2015 9.5  8.9 - 10.3 mg/dL Final  . Total Protein 08/07/2015 7.4  6.5 - 8.1 g/dL Final  . Albumin 08/07/2015 4.5  3.5 - 5.0 g/dL Final  . AST 08/07/2015 32  15 - 41 U/L Final  . ALT 08/07/2015 40  14 - 54 U/L Final  . Alkaline Phosphatase 08/07/2015 60  38 - 126 U/L Final  . Total Bilirubin 08/07/2015 1.1  0.3 - 1.2 mg/dL Final  . GFR calc non Af Amer 08/07/2015 >60  >60 mL/min Final  . GFR calc Af Amer 08/07/2015 >60  >60 mL/min Final   Comment: (NOTE) The eGFR has been calculated using the CKD EPI equation. This calculation has not been validated in all clinical situations. eGFR's persistently <60 mL/min signify possible Chronic Kidney Disease.   . Anion gap 08/07/2015 10  5 - 15 Final  . LDH 08/07/2015 138  98 - 192 U/L Final  Hospital Outpatient Visit on 08/05/2015  Component Date Value Ref Range Status  . Glucose-Capillary 08/05/2015 93  65 - 99 mg/dL Final      ASSESSMENT: Small lymphocytic lymphoma asymptomatic Hemoglobin and platelet count and white count stable   MEDICAL DECISION MAKING:  Your observation.  Repeat PET scan for evaluation of lymph nodes in next 4 months Patient expressed  understanding and was in agreement with this plan. She also understands that She can call clinic at any time with any questions, concerns, or complaints.    No matching staging information was found for the patient.  Forest Gleason, MD   08/07/2015 11:04 AM

## 2015-08-07 NOTE — Addendum Note (Signed)
Addended by: Telford Nab on: 08/07/2015 11:24 AM   Modules accepted: Orders

## 2015-08-14 ENCOUNTER — Encounter: Payer: Self-pay | Admitting: *Deleted

## 2015-08-14 ENCOUNTER — Encounter
Admission: RE | Admit: 2015-08-14 | Discharge: 2015-08-14 | Disposition: A | Discharge status: Home / Self Care | Payer: BLUE CROSS/BLUE SHIELD | Source: Ambulatory Visit | Attending: Unknown Physician Specialty | Admitting: Unknown Physician Specialty

## 2015-08-14 DIAGNOSIS — Z01810 Encounter for preprocedural cardiovascular examination: Secondary | ICD-10-CM | POA: Insufficient documentation

## 2015-08-14 NOTE — Patient Instructions (Signed)
  Your procedure is scheduled on:08/20/15 Report to Day Surgery.[facility] SECOND FLOOR To find out your arrival time please call [phone] between 1PM - 3PM on 1/2  Remember: Instructions that are not followed completely may result in serious medical risk, up to and including death, or upon the discretion of your surgeon and anesthesiologist your surgery may need to be rescheduled.    _X___ 1. Do not eat food or drink liquids after midnight. No gum chewing or hard candies.     _X_ 2. No Alcohol for 24 hours before or after surgery.   ____ 3. Bring all medications with you on the day of surgery if instructed.    _X___ 4. Notify your doctor if there is any change in your medical condition     (cold, fever, infections).     Do not wear jewelry, make-up, hairpins, clips or nail polish.  Do not wear lotions, powders, or perfumes. You may wear deodorant.  Do not shave 48 hours prior to surgery. Men may shave face and neck.  Do not bring valuables to the hospital.    [facility] is not responsible for any belongings or valuables.               Contacts, dentures or bridgework may not be worn into surgery.  Leave your suitcase in the car. After surgery it may be brought to your room.  For patients admitted to the hospital, discharge time is determined by your                treatment team.   Patients discharged the day of surgery will not be allowed to drive home.   Please read over the following fact sheets that you were given:   Surgical Site Infection Prevention   ____ Take these medicines the morning of surgery with A SIP OF WATER:    1. AMLODIPINE  2.   3.   4.  5.  6.  ____ Fleet Enema (as directed)   ____ Use CHG Soap as directed  ____ Use inhalers on the day of surgery  ____ Stop metformin 2 days prior to surgery    ____ Take 1/2 of usual insulin dose the night before surgery and none on the morning of surgery.   ____ Stop Coumadin/Plavix/aspirin on   ____  Stop Anti-inflammatories on    ____ Stop supplements until after surgery.    ____ Bring C-Pap to the hospital.

## 2015-08-14 NOTE — Pre-Procedure Instructions (Addendum)
As instructed by Dr Assunta Gambles, request for clearance regarding ekg called and faxed to [facility] at Dr Thea Alken and Steffanie Dunn at Dr Gemma Payor. Left message at nurses station and at patient mobile number

## 2015-08-16 ENCOUNTER — Telehealth: Payer: Self-pay | Admitting: Family Medicine

## 2015-08-16 NOTE — Telephone Encounter (Signed)
Faxed to # on form [phone]. Spoke to [place] and she received it.

## 2015-08-16 NOTE — Telephone Encounter (Signed)
Erin from [facility] pre admitting [phone] called requesting if you had received clearance for surgery. I told her I don't know if you have received it nor sent back to front to be scanned in. She re-faxed it and in you INbox. Please return to [place] once complete.   They need to know if pt needs further workup or is cleared for surgery. Please advise

## 2015-08-16 NOTE — Telephone Encounter (Signed)
I sent it up Wednesday Just did it again and gave it to [facility]

## 2015-08-20 ENCOUNTER — Ambulatory Visit
Admission: RE | Admit: 2015-08-20 | Discharge: 2015-08-20 | Disposition: A | Discharge status: Home / Self Care | Payer: BLUE CROSS/BLUE SHIELD | Source: Ambulatory Visit | Attending: Unknown Physician Specialty | Admitting: Unknown Physician Specialty

## 2015-08-20 ENCOUNTER — Ambulatory Visit: Payer: BLUE CROSS/BLUE SHIELD | Admitting: Anesthesiology

## 2015-08-20 ENCOUNTER — Encounter: Payer: Self-pay | Admitting: Anesthesiology

## 2015-08-20 ENCOUNTER — Encounter
Admission: RE | Disposition: A | Discharge status: Home / Self Care | Payer: Self-pay | Source: Ambulatory Visit | Attending: Unknown Physician Specialty

## 2015-08-20 DIAGNOSIS — Z833 Family history of diabetes mellitus: Secondary | ICD-10-CM | POA: Insufficient documentation

## 2015-08-20 DIAGNOSIS — Z832 Family history of diseases of the blood and blood-forming organs and certain disorders involving the immune mechanism: Secondary | ICD-10-CM | POA: Insufficient documentation

## 2015-08-20 DIAGNOSIS — Z79899 Other long term (current) drug therapy: Secondary | ICD-10-CM | POA: Insufficient documentation

## 2015-08-20 DIAGNOSIS — E7800 Pure hypercholesterolemia, unspecified: Secondary | ICD-10-CM | POA: Diagnosis not present

## 2015-08-20 DIAGNOSIS — Z86010 Personal history of colonic polyps: Secondary | ICD-10-CM | POA: Diagnosis not present

## 2015-08-20 DIAGNOSIS — F418 Other specified anxiety disorders: Secondary | ICD-10-CM | POA: Insufficient documentation

## 2015-08-20 DIAGNOSIS — E785 Hyperlipidemia, unspecified: Secondary | ICD-10-CM | POA: Insufficient documentation

## 2015-08-20 DIAGNOSIS — E063 Autoimmune thyroiditis: Secondary | ICD-10-CM | POA: Diagnosis not present

## 2015-08-20 DIAGNOSIS — Z806 Family history of leukemia: Secondary | ICD-10-CM | POA: Diagnosis not present

## 2015-08-20 DIAGNOSIS — Z880 Allergy status to penicillin: Secondary | ICD-10-CM | POA: Diagnosis not present

## 2015-08-20 DIAGNOSIS — E041 Nontoxic single thyroid nodule: Secondary | ICD-10-CM | POA: Diagnosis present

## 2015-08-20 HISTORY — DX: Essential (primary) hypertension: I10

## 2015-08-20 HISTORY — DX: Depression, unspecified: F32.A

## 2015-08-20 HISTORY — DX: Major depressive disorder, single episode, unspecified: F32.9

## 2015-08-20 HISTORY — DX: Malignant (primary) neoplasm, unspecified: C80.1

## 2015-08-20 HISTORY — DX: Anxiety disorder, unspecified: F41.9

## 2015-08-20 HISTORY — PX: THYROIDECTOMY: SHX17

## 2015-08-20 SURGERY — THYROIDECTOMY
Anesthesia: General | Site: Throat | Laterality: Left | Wound class: Clean Contaminated

## 2015-08-20 MED ORDER — SUCCINYLCHOLINE CHLORIDE 20 MG/ML IJ SOLN
INTRAMUSCULAR | Status: DC | PRN
  Administered 2015-08-20: 100 mg via INTRAVENOUS

## 2015-08-20 MED ORDER — ONDANSETRON HCL 4 MG/2ML IJ SOLN
4.0000 mg | Freq: Once | INTRAMUSCULAR | Status: AC
  Administered 2015-08-20: 4 mg via INTRAVENOUS

## 2015-08-20 MED ORDER — PROMETHAZINE HCL 25 MG/ML IJ SOLN
INTRAMUSCULAR | Status: AC
  Filled 2015-08-20: qty 1

## 2015-08-20 MED ORDER — PROMETHAZINE HCL 25 MG/ML IJ SOLN
6.2500 mg | Freq: Once | INTRAMUSCULAR | Status: AC
  Administered 2015-08-20: 6.25 mg via INTRAVENOUS

## 2015-08-20 MED ORDER — ONDANSETRON HCL 4 MG/2ML IJ SOLN
INTRAMUSCULAR | Status: AC
  Filled 2015-08-20: qty 2

## 2015-08-20 MED ORDER — FENTANYL CITRATE (PF) 100 MCG/2ML IJ SOLN
INTRAMUSCULAR | Status: DC
Start: 2015-08-20 — End: 2015-08-20
  Filled 2015-08-20: qty 2

## 2015-08-20 MED ORDER — FENTANYL CITRATE (PF) 100 MCG/2ML IJ SOLN
INTRAMUSCULAR | Status: DC | PRN
  Administered 2015-08-20: 150 ug via INTRAVENOUS
  Administered 2015-08-20: 100 ug via INTRAVENOUS
  Administered 2015-08-20: 50 ug via INTRAVENOUS

## 2015-08-20 MED ORDER — HYDROCODONE-ACETAMINOPHEN 5-300 MG PO TABS: 1.0000 | ORAL_TABLET | ORAL | Status: DC | PRN

## 2015-08-20 MED ORDER — MIDAZOLAM HCL 2 MG/2ML IJ SOLN
INTRAMUSCULAR | Status: DC | PRN
  Administered 2015-08-20: 2 mg via INTRAVENOUS

## 2015-08-20 MED ORDER — LIDOCAINE-EPINEPHRINE 1 %-1:100000 IJ SOLN
INTRAMUSCULAR | Status: DC | PRN
  Administered 2015-08-20: 4 mL

## 2015-08-20 MED ORDER — ONDANSETRON HCL 4 MG/2ML IJ SOLN
INTRAMUSCULAR | Status: DC | PRN
  Administered 2015-08-20: 4 mg via INTRAVENOUS

## 2015-08-20 MED ORDER — FENTANYL CITRATE (PF) 100 MCG/2ML IJ SOLN
25.0000 ug | INTRAMUSCULAR | Status: DC | PRN
  Administered 2015-08-20 (×4): 25 ug via INTRAVENOUS

## 2015-08-20 MED ORDER — SODIUM CHLORIDE 0.9 % IJ SOLN
INTRAMUSCULAR | Status: AC
  Filled 2015-08-20: qty 10

## 2015-08-20 MED ORDER — OXYCODONE HCL 5 MG PO TABS: 5.0000 mg | ORAL_TABLET | Freq: Once | ORAL | Status: DC | PRN

## 2015-08-20 MED ORDER — FAMOTIDINE 20 MG PO TABS
20.0000 mg | ORAL_TABLET | Freq: Once | ORAL | Status: AC
  Administered 2015-08-20: 20 mg via ORAL

## 2015-08-20 MED ORDER — DEXAMETHASONE SODIUM PHOSPHATE 10 MG/ML IJ SOLN
INTRAMUSCULAR | Status: DC | PRN
  Administered 2015-08-20: 10 mg via INTRAVENOUS

## 2015-08-20 MED ORDER — FAMOTIDINE 20 MG PO TABS
ORAL_TABLET | ORAL | Status: AC
  Administered 2015-08-20: 20 mg via ORAL
  Filled 2015-08-20: qty 1

## 2015-08-20 MED ORDER — PROPOFOL 10 MG/ML IV BOLUS
INTRAVENOUS | Status: DC | PRN
  Administered 2015-08-20: 130 mg via INTRAVENOUS
  Administered 2015-08-20: 40 mg via INTRAVENOUS

## 2015-08-20 MED ORDER — LACTATED RINGERS IV SOLN
INTRAVENOUS | Status: DC
  Administered 2015-08-20: 07:00:00 via INTRAVENOUS

## 2015-08-20 MED ORDER — OXYCODONE HCL 5 MG/5ML PO SOLN: 5.0000 mg | Freq: Once | ORAL | Status: DC | PRN

## 2015-08-20 MED ORDER — LIDOCAINE-EPINEPHRINE 1 %-1:100000 IJ SOLN
INTRAMUSCULAR | Status: AC
  Filled 2015-08-20: qty 1

## 2015-08-20 SURGICAL SUPPLY — 34 items
BLADE SURG 15 STRL LF DISP TIS (BLADE) ×1 IMPLANT
BLADE SURG 15 STRL SS (BLADE) ×3
CANISTER SUCT 1200ML W/VALVE (MISCELLANEOUS) ×3 IMPLANT
CORD BIP STRL DISP 12FT (MISCELLANEOUS) ×3 IMPLANT
DRAIN TLS ROUND 10FR (DRAIN) ×1 IMPLANT
DRAPE MAG INST 16X20 L/F (DRAPES) ×3 IMPLANT
DRSG TEGADERM 2-3/8X2-3/4 SM (GAUZE/BANDAGES/DRESSINGS) ×3 IMPLANT
ELECT LARYNGEAL 6/7 (MISCELLANEOUS) ×3
ELECT LARYNGEAL 8/9 (MISCELLANEOUS)
ELECTRODE LARYNGEAL 6/7 (MISCELLANEOUS) ×1 IMPLANT
ELECTRODE LARYNGEAL 8/9 (MISCELLANEOUS) ×1 IMPLANT
FORCEPS JEWEL BIP 4-3/4 STR (INSTRUMENTS) ×5 IMPLANT
GLOVE BIO SURGEON STRL SZ7.5 (GLOVE) ×6 IMPLANT
GOWN STRL REUS W/ TWL LRG LVL3 (GOWN DISPOSABLE) ×3 IMPLANT
GOWN STRL REUS W/TWL LRG LVL3 (GOWN DISPOSABLE) ×9
HARMONIC SCALPEL FOCUS (MISCELLANEOUS) ×3 IMPLANT
HEMOSTAT SURGICEL 2X3 (HEMOSTASIS) ×3 IMPLANT
HOOK STAY 5M SHARP BLUNT 3316- (MISCELLANEOUS) ×3 IMPLANT
KIT RM TURNOVER STRD PROC AR (KITS) ×3 IMPLANT
LABEL OR SOLS (LABEL) ×1 IMPLANT
LIQUID BAND (GAUZE/BANDAGES/DRESSINGS) ×3 IMPLANT
NS IRRIG 500ML POUR BTL (IV SOLUTION) ×3 IMPLANT
PACK HEAD/NECK (MISCELLANEOUS) ×3 IMPLANT
PAD GROUND ADULT SPLIT (MISCELLANEOUS) ×3 IMPLANT
PROBE NEUROSIGN BIPOL (MISCELLANEOUS) ×1 IMPLANT
PROBE NEUROSIGN BIPOLAR (MISCELLANEOUS) ×3
SPONGE KITTNER 5P (MISCELLANEOUS) ×5 IMPLANT
SPONGE XRAY 4X4 16PLY STRL (MISCELLANEOUS) ×3 IMPLANT
STAPLER SKIN PROX 35W (STAPLE) ×3 IMPLANT
SUT SILK 2 0 (SUTURE)
SUT SILK 2 0 SH (SUTURE) ×3 IMPLANT
SUT SILK 2-0 18XBRD TIE 12 (SUTURE) ×1 IMPLANT
SUT VIC AB 4-0 RB1 18 (SUTURE) ×6 IMPLANT
SYSTEM CHEST DRAIN TLS 7FR (DRAIN) ×1 IMPLANT

## 2015-08-20 NOTE — Op Note (Signed)
OPERATIVE REPORT  DATE OF SURGERY: 08/20/2015  PATIENT:  Anita Buckley,  65 y.o. female  PRE-OPERATIVE DIAGNOSIS:  Thyroid nodule  POST-OPERATIVE DIAGNOSIS:  Thyroid nodule  PROCEDURE:  Procedure(s): Left  HEMITHYROIDECTOMY with isthmusectomy: Laryngeal nerve monitoring 1 hour  SURGEON:  Roena Malady, MD  ASSISTANTS: Vaught  ANESTHESIA:   General   EBL:  20  ml  DRAINS: None  LOCAL MEDICATIONS USED:  1% lidocaine with 1 100,000 units of epinephrine 6 cc were used  SPECIMEN:  Left lobe of thyroid including left nodule measuring approximately 2 x 2 centimeters  COUNTS:  Correct  PROCEDURE DETAILS: The patient was taken to the operating room and placed on the operating table in the supine position. The patient was intubated with a laryngeal safe endotracheal tube and monitor A shoulder roll was placed beneath the shoulder blades and the neck was extended. The neck was prepped and draped in a standard fashion. A transverse incision was outlined marking pen in a natural skin tension line and was incised with a 15 blade. Dissection was continued down through the platysma layer. Self-retaining thyroid retractor was used throughout the case.  The midline fascia was divided. A large nodule was identified involving the left lobe this measured approximately 2 x 2 centimeters. The strap muscles were dissected off the left lobe of thyroid. Hemostasis was achieved using the Harmonic scalpel. As strap muscles were retracted laterally the superior pole was identified. The vessels of the superior pole were isolated and divided using the Harmonic scalpel. In similar fashion the inferior pole vessels were identified and divided using the Harmonic scalpel. The left lobe of the gland was then medialized the superior and inferior parathyroid glands were identified and these were dissected free from the gland and left intact and there mask her pedicles. The recurrent laryngeal nerve was identified  in the tracheoesophageal groove this was stimulated and remained intact throughout the case. The gland was then retracted medially all feeding vessels were divided using the Harmonic scalpel. Berry's ligament was identified and divided using the microbipolar this allowed the gland to free up of the anterior tracheal wall. The isthmus was dissected as well and using the Harmonic scalpel the isthmus and left lobe of the thyroid were removed in entirety. A stitch was placed in the left upper lobe for marking. The wounds and copious irrigated with saline any small bleeding points were cauterized using the microbipolar. Examination of the wound showed no active bleeding. The recurrent laryngeal nerve was stimulated end of the case and continue stimulated fully. Surgicel was then placed in the surgical bed. The strap muscles then reapproximated in the midline using 4-0 Vicryl. The platysma layer was closed using 4-0 Vicryl and the subcutaneous tissues were closed using 4-0 Vicryl. Skin glue was used to reapproximate the wound edges. This completed the patient was in return anesthesia where she was x-rayed in the operating room and taken to recovery room in stable condition.  Cultures: None  Specimens: Left lobe and isthmus of thyroid with thyroid nodule  EBL: Less than 20 cc   PATIENT DISPOSITION:  To PACU, stable

## 2015-08-20 NOTE — Progress Notes (Signed)
Dr Romie Levee called re naus/vomit yellowish bile - phenergan 6.25mg  IV diluted, slowly given as ordered

## 2015-08-20 NOTE — Anesthesia Preprocedure Evaluation (Signed)
Anesthesia Evaluation  Patient identified by MRN, date of birth, ID band Patient awake    Reviewed: Allergy & Precautions, H&P , NPO status , Patient's Chart, lab work & pertinent test results  History of Anesthesia Complications Negative for: history of anesthetic complications  Airway Mallampati: III  TM Distance: >3 FB Neck ROM: full    Dental no notable dental hx. (+) Teeth Intact   Pulmonary neg pulmonary ROS, neg shortness of breath,    Pulmonary exam normal breath sounds clear to auscultation       Cardiovascular Exercise Tolerance: Good hypertension, (-) angina(-) Past MI and (-) DOE Normal cardiovascular exam Rhythm:regular Rate:Normal     Neuro/Psych PSYCHIATRIC DISORDERS Anxiety Depression negative neurological ROS     GI/Hepatic negative GI ROS, Neg liver ROS, neg GERD  ,  Endo/Other  negative endocrine ROS  Renal/GU negative Renal ROS  negative genitourinary   Musculoskeletal   Abdominal   Peds  Hematology negative hematology ROS (+)   Anesthesia Other Findings Past Medical History:   History of depression                                        History of anxiety                                           Situational stress                                           History of hyperlipidemia                                    History of fibrocystic disease of breast                     Hypertension                                                 Anxiety                                                      Depression                                                   Cancer ([place])                                                   Comment:B CELL LYMPHOMA  Past Surgical History:   TUBAL LIGATION  COLONOSCOPY                                      12/2005        Comment:polyps, tics     Reproductive/Obstetrics negative OB ROS                              Anesthesia Physical Anesthesia Plan  ASA: III  Anesthesia Plan: General ETT   Post-op Pain Management:    Induction:   Airway Management Planned:   Additional Equipment:   Intra-op Plan:   Post-operative Plan:   Informed Consent: I have reviewed the patients History and Physical, chart, labs and discussed the procedure including the risks, benefits and alternatives for the proposed anesthesia with the patient or authorized representative who has indicated his/her understanding and acceptance.   Dental Advisory Given  Plan Discussed with: Anesthesiologist, CRNA and Surgeon  Anesthesia Plan Comments:         Anesthesia Quick Evaluation

## 2015-08-20 NOTE — H&P (Signed)
  H+P  Reviewed and will be scanned in later. No changes noted. 

## 2015-08-20 NOTE — Transfer of Care (Signed)
Immediate Anesthesia Transfer of Care Note  Patient: Anita Buckley  Procedure(s) Performed: Procedure(s): THYROIDECTOMY/ HEMITHYROIDECTOMY (Left)  Patient Location: PACU  Anesthesia Type:General  Level of Consciousness: patient cooperative and lethargic  Airway & Oxygen Therapy: Patient Spontanous Breathing and Patient connected to face mask oxygen  Post-op Assessment: Report given to RN and Post -op Vital signs reviewed and stable  Post vital signs: Reviewed and stable  Last Vitals:  Filed Vitals:   08/20/15 0649 08/20/15 0857  BP: 136/83 128/71  Pulse: 61   Temp: 36.5 C 36.4 C  Resp: 20     Complications: No apparent anesthesia complications

## 2015-08-20 NOTE — Anesthesia Procedure Notes (Signed)
Procedure Name: Intubation Date/Time: 08/20/2015 7:27 AM Performed by: Jonna Clark Pre-anesthesia Checklist: Patient identified, Patient being monitored, Timeout performed, Emergency Drugs available and Suction available Patient Re-evaluated:Patient Re-evaluated prior to inductionOxygen Delivery Method: Circle system utilized Preoxygenation: Pre-oxygenation with 100% oxygen Intubation Type: IV induction Ventilation: Mask ventilation without difficulty Laryngoscope Size: Miller and 2 Grade View: Grade I Tube type: Oral Tube size: 7.0 mm Number of attempts: 1 Airway Equipment and Method: Stylet Placement Confirmation: ETT inserted through vocal cords under direct vision,  positive ETCO2 and breath sounds checked- equal and bilateral Secured at: 21 cm Tube secured with: Tape (laryngeal monitoring used) Dental Injury: Teeth and Oropharynx as per pre-operative assessment

## 2015-08-20 NOTE — Anesthesia Postprocedure Evaluation (Signed)
Anesthesia Post Note  Patient: Anita Buckley  Procedure(s) Performed: Procedure(s) (LRB): THYROIDECTOMY/ HEMITHYROIDECTOMY (Left)  Patient location during evaluation: PACU Anesthesia Type: General Level of consciousness: awake and alert Pain management: pain level controlled Vital Signs Assessment: post-procedure vital signs reviewed and stable Respiratory status: spontaneous breathing, nonlabored ventilation, respiratory function stable and patient connected to nasal cannula oxygen Cardiovascular status: blood pressure returned to baseline and stable Postop Assessment: no signs of nausea or vomiting Anesthetic complications: no    Last Vitals:  Filed Vitals:   08/20/15 1012 08/20/15 1028  BP:  137/72  Pulse:  66  Temp: 36.4 C 35.6 C  Resp:  16    Last Pain:  Filed Vitals:   08/20/15 1036  PainSc: 4                  Joseph K Piscitello

## 2015-08-21 LAB — SURGICAL PATHOLOGY

## 2015-08-30 ENCOUNTER — Encounter: Payer: Self-pay | Admitting: Family Medicine

## 2015-09-11 ENCOUNTER — Encounter: Payer: Self-pay | Admitting: Family Medicine

## 2015-12-16 ENCOUNTER — Other Ambulatory Visit: Payer: Self-pay | Admitting: Family Medicine

## 2016-02-07 ENCOUNTER — Encounter: Payer: Self-pay | Admitting: Family Medicine

## 2016-02-07 ENCOUNTER — Ambulatory Visit (INDEPENDENT_AMBULATORY_CARE_PROVIDER_SITE_OTHER): Payer: BLUE CROSS/BLUE SHIELD | Admitting: Family Medicine

## 2016-02-07 VITALS — BP 132/80 | HR 64 | Temp 98.2°F | Ht 65.5 in | Wt 147.0 lb

## 2016-02-07 DIAGNOSIS — C8590 Non-Hodgkin lymphoma, unspecified, unspecified site: Secondary | ICD-10-CM

## 2016-02-07 DIAGNOSIS — E041 Nontoxic single thyroid nodule: Secondary | ICD-10-CM | POA: Diagnosis not present

## 2016-02-07 DIAGNOSIS — Z0000 Encounter for general adult medical examination without abnormal findings: Secondary | ICD-10-CM | POA: Diagnosis not present

## 2016-02-07 DIAGNOSIS — E7800 Pure hypercholesterolemia, unspecified: Secondary | ICD-10-CM

## 2016-02-07 DIAGNOSIS — M8580 Other specified disorders of bone density and structure, unspecified site: Secondary | ICD-10-CM | POA: Diagnosis not present

## 2016-02-07 DIAGNOSIS — R739 Hyperglycemia, unspecified: Secondary | ICD-10-CM | POA: Diagnosis not present

## 2016-02-07 DIAGNOSIS — I10 Essential (primary) hypertension: Secondary | ICD-10-CM

## 2016-02-07 DIAGNOSIS — Z1211 Encounter for screening for malignant neoplasm of colon: Secondary | ICD-10-CM

## 2016-02-07 LAB — LIPID PANEL
CHOLESTEROL: 203 mg/dL — AB (ref 0–200)
HDL: 69.5 mg/dL (ref >39.00)
LDL Cholesterol: 118 mg/dL — ABNORMAL HIGH (ref 0–99)
NonHDL: 133.35
Total CHOL/HDL Ratio: 3
Triglycerides: 78 mg/dL (ref 0.0–149.0)
VLDL: 15.6 mg/dL (ref 0.0–40.0)

## 2016-02-07 LAB — CBC WITH DIFFERENTIAL/PLATELET
BASOS PCT: 0.5 % (ref 0.0–3.0)
Basophils Absolute: 0 10*3/uL (ref 0.0–0.1)
EOS PCT: 5.7 % — AB (ref 0.0–5.0)
Eosinophils Absolute: 0.4 10*3/uL (ref 0.0–0.7)
HCT: 38.9 % (ref 36.0–46.0)
Hemoglobin: 13 g/dL (ref 12.0–15.0)
LYMPHS ABS: 2.3 10*3/uL (ref 0.7–4.0)
Lymphocytes Relative: 35.1 % (ref 12.0–46.0)
MCHC: 33.4 g/dL (ref 30.0–36.0)
MCV: 87.2 fl (ref 78.0–100.0)
MONO ABS: 0.6 10*3/uL (ref 0.1–1.0)
Monocytes Relative: 9.2 % (ref 3.0–12.0)
NEUTROS PCT: 49.5 % (ref 43.0–77.0)
Neutro Abs: 3.2 10*3/uL (ref 1.4–7.7)
Platelets: 232 10*3/uL (ref 150.0–400.0)
RBC: 4.46 Mil/uL (ref 3.87–5.11)
RDW: 13.7 % (ref 11.5–15.5)
WBC: 6.4 10*3/uL (ref 4.0–10.5)

## 2016-02-07 LAB — COMPREHENSIVE METABOLIC PANEL
ALK PHOS: 38 U/L — AB (ref 39–117)
ALT: 23 U/L (ref 0–35)
AST: 21 U/L (ref 0–37)
Albumin: 4.5 g/dL (ref 3.5–5.2)
BILIRUBIN TOTAL: 0.6 mg/dL (ref 0.2–1.2)
BUN: 17 mg/dL (ref 6–23)
CHLORIDE: 104 meq/L (ref 96–112)
CO2: 30 mEq/L (ref 19–32)
Calcium: 9.6 mg/dL (ref 8.4–10.5)
Creatinine, Ser: 0.67 mg/dL (ref 0.40–1.20)
GFR: 94.4 mL/min (ref >60.00)
GLUCOSE: 92 mg/dL (ref 70–99)
POTASSIUM: 3.8 meq/L (ref 3.5–5.1)
SODIUM: 138 meq/L (ref 135–145)
Total Protein: 7 g/dL (ref 6.0–8.3)

## 2016-02-07 LAB — TSH: TSH: 3.76 u[IU]/mL (ref 0.35–4.50)

## 2016-02-07 LAB — HEMOGLOBIN A1C: Hgb A1c MFr Bld: 5.6 % (ref 4.6–6.5)

## 2016-02-07 LAB — VITAMIN D 25 HYDROXY (VIT D DEFICIENCY, FRACTURES): VITD: 54.74 ng/mL (ref 30.00–100.00)

## 2016-02-07 MED ORDER — LORATADINE 10 MG PO CAPS: 10.0000 mg | ORAL_CAPSULE | Freq: Every day | ORAL | Status: DC

## 2016-02-07 MED ORDER — EPINEPHRINE 0.3 MG/0.3ML IJ SOAJ: 0.3000 mg | INTRAMUSCULAR | Status: DC | PRN

## 2016-02-07 MED ORDER — AMLODIPINE BESYLATE 5 MG PO TABS: 5.0000 mg | ORAL_TABLET | Freq: Every day | ORAL | Status: DC

## 2016-02-07 MED ORDER — ALENDRONATE SODIUM 70 MG PO TABS: 70.0000 mg | ORAL_TABLET | ORAL | Status: DC

## 2016-02-07 MED ORDER — ALPRAZOLAM 0.5 MG PO TABS: 0.5000 mg | ORAL_TABLET | Freq: Every day | ORAL | Status: DC | PRN

## 2016-02-07 MED ORDER — FAMOTIDINE 10 MG PO TABS: 10.0000 mg | ORAL_TABLET | Freq: Every day | ORAL | Status: DC

## 2016-02-07 NOTE — Addendum Note (Signed)
Addended by: Tammi Sou on: 02/07/2016 12:48 PM   Modules accepted: Orders, Medications

## 2016-02-07 NOTE — Progress Notes (Signed)
Pre visit review using our clinic review tool, if applicable. No additional management support is needed unless otherwise documented below in the visit note. 

## 2016-02-07 NOTE — Patient Instructions (Signed)
Labs today  Don't forget to schedule your colonoscopy with Dr Merlene Morse you need a referral let us know  We will look for your mammogram  Take care of yourself

## 2016-02-07 NOTE — Progress Notes (Signed)
Subjective:    Patient ID: Anita Buckley, female    DOB: 02/01/1951, 65 y.o.   MRN: [id number]  HPI Here for health maintenance exam and to review chronic medical problems    Feeling ok  No medical changes  She takes melatonin to help sleep  Situational stress -family issues   Wt Readings from Last 3 Encounters:  02/07/16 147 lb (66.679 kg)  08/07/15 147 lb 11.3 oz (67 kg)  08/06/15 151 lb 8 oz (68.72 kg)   bmi is 24 Maintains a good weight  Yoga/walking/ Y for exercise  Eats healthy  Less alcohol lately   Pap 5/13-normal Gets PET scan - every few years for hx of lymphoma  She does not want a pap/gyn visit this visit    Colonoscopy 1/07- per pt nl  Got a card that it was due- wants to schedule   ifob 6/16 negative   Declines hep C and HIV screen  Flu shot 10/16  Mammogram 9/15-negative -thinks she had one at [facility] in the fall (will send for that)  Self breast exam -no lumps or changes   Td 5/15  Zoster vaccine 10/08  dexa 6/13 mild osteopenia 11/16 worsened osteopenia  Does not want another dexa yet No falls or fx On ca and D  bp is stable today  No cp or palpitations or headaches or edema  No side effects to medicines  BP Readings from Last 3 Encounters:  02/07/16 132/80  08/20/15 111/72  08/07/15 129/86     Hx of thyroid nodule with a bx Lab Results  Component Value Date   TSH 2.81 01/22/2015   wants a repeat tsh    Hx of hyperglycemia-very well controlled  Lab Results  Component Value Date   HGBA1C 5.5 01/22/2015   Fasting blood sugar is 106 Due for A1C today  Hx of hyperlipidemia Due for lipid check    Lab Results  Component Value Date   ALT 40 08/07/2015   AST 32 08/07/2015   ALKPHOS 60 08/07/2015   BILITOT 1.1 08/07/2015    A lot of stress/causes neck spasm Meditation and yoga help  Uses xanax occ and needs a refill   Due for labs today   Patient Active Problem List   Diagnosis Date Noted  . Colon cancer  screening 01/25/2015  . Estrogen deficiency 01/25/2015  . Lymphoma ([place]) 06/04/2014  . Osteopenia 01/06/2013  . Left thyroid nodule 06/29/2012  . Goiter 06/28/2012  . HTN (hypertension), benign 06/28/2012  . Other screening mammogram 01/05/2012  . Gynecological examination 01/05/2012  . Post-menopause 01/05/2012  . Routine general medical examination at a health care facility 12/31/2011  . TRANSAMINASES, SERUM, ELEVATED 09/20/2008  . PURE HYPERCHOLESTEROLEMIA 08/15/2008  . ANXIETY 12/08/2007  . DEPRESSION 03/30/2007  . FIBROCYSTIC BREAST DISEASE 03/30/2007  . Hyperglycemia 03/30/2007   Past Medical History  Diagnosis Date  . History of depression   . History of anxiety   . Situational stress   . History of hyperlipidemia   . History of fibrocystic disease of breast   . Hypertension   . Anxiety   . Depression   . Cancer ([place])     B CELL LYMPHOMA   Past Surgical History  Procedure Laterality Date  . Tubal ligation    . Colonoscopy  12/2005    polyps, tics  . Thyroidectomy Left 08/20/2015    Procedure: THYROIDECTOMY/ HEMITHYROIDECTOMY;  Surgeon: Beverly Gust, MD;  Location: [facility] ORS;  Service: ENT;  Laterality: Left;  Social History  Substance Use Topics  . Smoking status: Never Smoker   . Smokeless tobacco: Never Used  . Alcohol Use: 0.0 oz/week    0 Standard drinks or equivalent per week     Comment: OCC   Family History  Problem Relation Age of Onset  . Diabetes Father     late in life  . Alcohol abuse Father   . Hyperlipidemia Mother   . Mental illness Mother   . Depression Sister   . Leukemia Brother   . Schizophrenia Other     Grandfather   Allergies  Allergen Reactions  . Bee Venom     Anaphylaxis   . Buspirone Hcl     REACTION: did not like  . Minocycline Nausea And Vomiting  . Penicillins     REACTION: reaction as a child   Current Outpatient Prescriptions on File Prior to Visit  Medication Sig Dispense Refill  . calcium carbonate  (OS-CAL) 1250 MG chewable tablet Chew 1 tablet by mouth 3 (three) times daily.    . Cholecalciferol (VITAMIN D-3) 5000 UNITS TABS Take 1 tablet by mouth daily.     No current facility-administered medications on file prior to visit.    Review of Systems     Objective:   Physical Exam        Assessment & Plan:

## 2016-02-08 ENCOUNTER — Encounter: Payer: Self-pay | Admitting: Family Medicine

## 2016-02-08 NOTE — Addendum Note (Signed)
Addended by: Loura Pardon A on: 02/08/2016 05:46 PM   Modules accepted: Miquel Dunn

## 2016-02-08 NOTE — Progress Notes (Signed)
Subjective:    Patient ID: Anita Buckley, female    DOB: 1951-01-25, 65 y.o.   MRN: [id number]  HPI    Review of Systems Review of Systems  Constitutional: Negative for fever, appetite change, fatigue and unexpected weight change.  Eyes: Negative for pain and visual disturbance.  Respiratory: Negative for cough and shortness of breath.   Cardiovascular: Negative for cp or palpitations    Gastrointestinal: Negative for nausea, diarrhea and constipation.  Genitourinary: Negative for urgency and frequency.  Skin: Negative for pallor or rash   Neurological: Negative for weakness, light-headedness, numbness and headaches.  Hematological: Negative for adenopathy. Does not bruise/bleed easily.  Psychiatric/Behavioral: Negative for dysphoric mood. The patient is not nervous/anxious.         Objective:   Physical Exam  Constitutional: She appears well-developed and well-nourished. No distress.  Well appearing   HENT:  Head: Normocephalic and atraumatic.  Right Ear: External ear normal.  Left Ear: External ear normal.  Mouth/Throat: Oropharynx is clear and moist.  Eyes: Conjunctivae and EOM are normal. Pupils are equal, round, and reactive to light. No scleral icterus.  Neck: Normal range of motion. Neck supple. No JVD present. Carotid bruit is not present. No thyromegaly present.  Cardiovascular: Normal rate, regular rhythm, normal heart sounds and intact distal pulses.  Exam reveals no gallop.   Pulmonary/Chest: Effort normal and breath sounds normal. No respiratory distress. She has no wheezes. She exhibits no tenderness.  Abdominal: Soft. Bowel sounds are normal. She exhibits no distension, no abdominal bruit and no mass. There is no tenderness.  Genitourinary: No breast swelling, tenderness, discharge or bleeding.  Musculoskeletal: Normal range of motion. She exhibits no edema or tenderness.  Lymphadenopathy:    She has no cervical adenopathy.  Neurological: She is alert.  She has normal reflexes. No cranial nerve deficit. She exhibits normal muscle tone. Coordination normal.  Skin: Skin is warm and dry. No rash noted. No erythema. No pallor.  Psychiatric: She has a normal mood and affect.          Assessment & Plan:   Problem List Items Addressed This Visit      Cardiovascular and Mediastinum   HTN (hypertension), benign    bp in fair control at this time  BP Readings from Last 1 Encounters:  02/07/16 132/80   No changes needed Disc lifstyle change with low sodium diet and exercise  Labs reviewed       Relevant Medications   EPINEPHrine (EPIPEN 2-PAK) 0.3 mg/0.3 mL IJ SOAJ injection   amLODipine (NORVASC) 5 MG tablet     Endocrine   Left thyroid nodule    This has been followed and bx  TSH today No clinical changes         Musculoskeletal and Integument   Osteopenia    Pt declines repeat dexa yet Disc need for calcium/ vitamin D/ wt bearing exercise and bone density test every 2 y to monitor Disc safety/ fracture risk in detail   No falls or fractures       Relevant Orders   VITAMIN D 25 Hydroxy (Vit-D Deficiency, Fractures) (Completed)     Other   Routine general medical examination at a health care facility - Primary    Reviewed health habits including diet and exercise and skin cancer prevention Reviewed appropriate screening tests for age  Also reviewed health mt list, fam hx and immunization status , as well as social and family history   See HPI Labs  reviewed Labs today  Don't forget to schedule your colonoscopy with Dr Merlene Morse you need a referral let us know  We will look for your mammogram  Take care of yourself       Relevant Orders   CBC with Differential/Platelet (Completed)   Comprehensive metabolic panel (Completed)   TSH (Completed)   Lipid panel (Completed)   VITAMIN D 25 Hydroxy (Vit-D Deficiency, Fractures) (Completed)   PURE HYPERCHOLESTEROLEMIA    Disc goals for lipids and reasons to control  them Rev labs with pt (last check) Lab today Rev low sat fat diet in detail        Relevant Medications   EPINEPHrine (EPIPEN 2-PAK) 0.3 mg/0.3 mL IJ SOAJ injection   amLODipine (NORVASC) 5 MG tablet   Lymphoma ([place])    Continues routine oncol visit with periodic PET scans       Relevant Medications   ALPRAZolam (XANAX) 0.5 MG tablet   Hyperglycemia    Due for A1C Controlled with diet and exercise       Relevant Orders   Hemoglobin A1c (Completed)   Colon cancer screening    Due for colonoscopy  Pt will schedule that with Dr Tiffany Kocher herself-has the reminder card Will update [place] if she needs a ref

## 2016-02-08 NOTE — Assessment & Plan Note (Signed)
Due for A1C Controlled with diet and exercise

## 2016-02-08 NOTE — Assessment & Plan Note (Signed)
Pt declines repeat dexa yet Disc need for calcium/ vitamin D/ wt bearing exercise and bone density test every 2 y to monitor Disc safety/ fracture risk in detail   No falls or fractures

## 2016-02-08 NOTE — Assessment & Plan Note (Signed)
This has been followed and bx  TSH today No clinical changes

## 2016-02-08 NOTE — Assessment & Plan Note (Signed)
Continues routine oncol visit with periodic PET scans

## 2016-02-08 NOTE — Assessment & Plan Note (Signed)
Due for colonoscopy  Pt will schedule that with Dr Tiffany Kocher herself-has the reminder card Will update [place] if she needs a ref

## 2016-02-08 NOTE — Assessment & Plan Note (Signed)
Reviewed health habits including diet and exercise and skin cancer prevention Reviewed appropriate screening tests for age  Also reviewed health mt list, fam hx and immunization status , as well as social and family history   See HPI Labs reviewed Labs today  Don't forget to schedule your colonoscopy with Dr Merlene Morse you need a referral let us know  We will look for your mammogram  Take care of yourself

## 2016-02-08 NOTE — Assessment & Plan Note (Signed)
Disc goals for lipids and reasons to control them Rev labs with pt (last check) Lab today Rev low sat fat diet in detail

## 2016-02-08 NOTE — Assessment & Plan Note (Signed)
bp in fair control at this time  BP Readings from Last 1 Encounters:  02/07/16 132/80   No changes needed Disc lifstyle change with low sodium diet and exercise  Labs reviewed

## 2016-02-11 ENCOUNTER — Encounter: Payer: Self-pay | Admitting: Family Medicine

## 2016-02-11 ENCOUNTER — Ambulatory Visit: Payer: BLUE CROSS/BLUE SHIELD | Admitting: Oncology

## 2016-02-11 ENCOUNTER — Other Ambulatory Visit: Payer: BLUE CROSS/BLUE SHIELD

## 2016-02-11 ENCOUNTER — Inpatient Hospital Stay: Payer: BLUE CROSS/BLUE SHIELD

## 2016-02-11 ENCOUNTER — Inpatient Hospital Stay: Payer: BLUE CROSS/BLUE SHIELD | Attending: Family Medicine | Admitting: Family Medicine

## 2016-02-11 VITALS — BP 137/95 | HR 76 | Temp 97.0°F | Ht 65.0 in | Wt 144.2 lb

## 2016-02-11 DIAGNOSIS — E785 Hyperlipidemia, unspecified: Secondary | ICD-10-CM | POA: Diagnosis not present

## 2016-02-11 DIAGNOSIS — C8301 Small cell B-cell lymphoma, lymph nodes of head, face, and neck: Secondary | ICD-10-CM | POA: Diagnosis present

## 2016-02-11 DIAGNOSIS — Z79899 Other long term (current) drug therapy: Secondary | ICD-10-CM | POA: Diagnosis not present

## 2016-02-11 DIAGNOSIS — C9110 Chronic lymphocytic leukemia of B-cell type not having achieved remission: Secondary | ICD-10-CM | POA: Insufficient documentation

## 2016-02-11 DIAGNOSIS — F418 Other specified anxiety disorders: Secondary | ICD-10-CM | POA: Insufficient documentation

## 2016-02-11 DIAGNOSIS — I10 Essential (primary) hypertension: Secondary | ICD-10-CM | POA: Diagnosis not present

## 2016-02-11 HISTORY — DX: Chronic lymphocytic leukemia of B-cell type not having achieved remission: C91.10

## 2016-02-11 NOTE — Progress Notes (Signed)
Patient here for follow up would like PET scan to be scheduled this year if possible.

## 2016-02-11 NOTE — Progress Notes (Signed)
[place]  [phone]  [phone]     KATEISHA NOWDEN DOB: 07/17/1951  MR#: [id number]  [id number]  Patient Care Team: Abner Greenspan, MD as PCP - General  CHIEF COMPLAINT:  Chief Complaint  Patient presents with  . lymphoma of the neck nodes  . Follow-up    INTERVAL HISTORY: Patient is here for further follow-up regarding small lymphocytic B-cell lymphoma. Patient remains asymptomatic at this time. She denies any fever, chills, unusual lumps or bumps. Patient did have a left hemithyroidectomy with isthmusectomy performed in January 2017 with Dr. Tami Ribas. She continues to follow with endocrinology and overall reports having no adverse effects following surgery.  REVIEW OF SYSTEMS:   Review of Systems  Constitutional: Negative for fever, chills, weight loss, malaise/fatigue and diaphoresis.  HENT: Negative.   Eyes: Negative.   Respiratory: Negative for cough, hemoptysis, sputum production, shortness of breath and wheezing.   Cardiovascular: Negative for chest pain, palpitations, orthopnea, claudication, leg swelling and PND.  Gastrointestinal: Negative for heartburn, nausea, vomiting, abdominal pain, diarrhea, constipation, blood in stool and melena.  Genitourinary: Negative.   Musculoskeletal: Negative.   Skin: Negative.   Neurological: Negative for dizziness, tingling, focal weakness, seizures and weakness.  Endo/Heme/Allergies: Does not bruise/bleed easily.  Psychiatric/Behavioral: Negative for depression. The patient is not nervous/anxious and does not have insomnia.     As per HPI. Otherwise, a complete review of systems is negatve.  ONCOLOGY HISTORY: Oncology History   66 year old lady who had needle aspiration of the thyroid nodule on the left side of the neck patient also had several enlarged lymph node on the left side of the neck aspiration revealed monoclonal population of lymphocytes CD5 positive to, CD23 positive, CD43,  CD20 positive.  In needle aspiration was positive for malignant cells predominantly small lymphocytes consistent with chronic lymphocytic leukemia or small lymphocytic lymphoma ([facility] [id number] and patient ID D [id number] ) October, 2015.  Thyroid nodule aspiration was negative for malignant cells.     Chronic lymphocytic leukemia (CLL), B-cell ([place])   02/11/2016 Initial Diagnosis Chronic lymphocytic leukemia (CLL), B-cell ([place])    PAST MEDICAL HISTORY: Past Medical History  Diagnosis Date  . History of depression   . History of anxiety   . Situational stress   . History of hyperlipidemia   . History of fibrocystic disease of breast   . Hypertension   . Anxiety   . Depression   . Cancer ([place])     B CELL LYMPHOMA  . Chronic lymphocytic leukemia (CLL), B-cell ([place]) 02/11/2016    PAST SURGICAL HISTORY: Past Surgical History  Procedure Laterality Date  . Tubal ligation    . Colonoscopy  12/2005    polyps, tics  . Thyroidectomy Left 08/20/2015    Procedure: THYROIDECTOMY/ HEMITHYROIDECTOMY;  Surgeon: Beverly Gust, MD;  Location: [facility] ORS;  Service: ENT;  Laterality: Left;    FAMILY HISTORY Family History  Problem Relation Age of Onset  . Diabetes Father     late in life  . Alcohol abuse Father   . Hyperlipidemia Mother   . Mental illness Mother   . Depression Sister   . Leukemia Brother   . Schizophrenia Other     Grandfather    GYNECOLOGIC HISTORY:  No LMP recorded. Patient is postmenopausal.     ADVANCED DIRECTIVES:    HEALTH MAINTENANCE: Social History  Substance Use Topics  . Smoking status: Never Smoker   . Smokeless tobacco: Never Used  . Alcohol Use: 0.0 oz/week  0 Standard drinks or equivalent per week     Comment: [facility]      Allergies  Allergen Reactions  . Bee Venom     Anaphylaxis   . Buspirone Hcl     REACTION: did not like  . Minocycline Nausea And Vomiting  . Penicillins     REACTION: reaction as a child    Current  Outpatient Prescriptions  Medication Sig Dispense Refill  . alendronate (FOSAMAX) 70 MG tablet Take 1 tablet (70 mg total) by mouth once a week. Take with a full glass of water on an empty stomach.    . ALPRAZolam (XANAX) 0.5 MG tablet Take 1 tablet (0.5 mg total) by mouth daily as needed for anxiety. 30 tablet 3  . amLODipine (NORVASC) 5 MG tablet Take 1 tablet (5 mg total) by mouth daily. 90 tablet 3  . calcium carbonate (OS-CAL) 1250 MG chewable tablet Chew 1 tablet by mouth 3 (three) times daily.    . Cholecalciferol (VITAMIN D-3) 5000 UNITS TABS Take 1 tablet by mouth daily.    Marland Kitchen EPINEPHrine (EPIPEN 2-PAK) 0.3 mg/0.3 mL IJ SOAJ injection Inject 0.3 mLs (0.3 mg total) into the muscle as needed. 1 Device 5  . famotidine (PEPCID) 10 MG tablet Take 1 tablet (10 mg total) by mouth daily.    . Loratadine 10 MG CAPS Take 1 capsule (10 mg total) by mouth daily. 30 each   . Melatonin 5 MG CAPS Take 1 capsule by mouth at bedtime as needed.     No current facility-administered medications for this visit.    OBJECTIVE: BP 137/95 mmHg  Pulse 76  Temp(Src) 97 F (36.1 C) (Tympanic)  Ht 5\' 5"  (1.651 m)  Wt 144 lb 3.2 oz (65.409 kg)  BMI 24.00 kg/m2   Body mass index is 24 kg/(m^2).    ECOG FS:0 - Asymptomatic  General: Well-developed, well-nourished, no acute distress. Eyes: Pink conjunctiva, anicteric sclera. HEENT: Normocephalic, moist mucous membranes, clear oropharnyx. Lungs: Clear to auscultation bilaterally. Heart: Regular rate and rhythm. No rubs, murmurs, or gallops. Abdomen: Soft, nontender, nondistended. No organomegaly noted, normoactive bowel sounds. Musculoskeletal: No edema, cyanosis, or clubbing. Neuro: Alert, answering all questions appropriately. Cranial nerves grossly intact. Skin: No rashes or petechiae noted. Psych: Normal affect. Lymphatics: No cervical, clavicular, or axillary LAD.   LAB RESULTS:  No visits with results within 3 Day(s) from this visit. Latest known  visit with results is:  Office Visit on 02/07/2016  Component Date Value Ref Range Status  . WBC 02/07/2016 6.4  4.0 - 10.5 K/uL Final  . RBC 02/07/2016 4.46  3.87 - 5.11 Mil/uL Final  . Hemoglobin 02/07/2016 13.0  12.0 - 15.0 g/dL Final  . HCT 02/07/2016 38.9  36.0 - 46.0 % Final  . MCV 02/07/2016 87.2  78.0 - 100.0 fl Final  . MCHC 02/07/2016 33.4  30.0 - 36.0 g/dL Final  . RDW 02/07/2016 13.7  11.5 - 15.5 % Final  . Platelets 02/07/2016 232.0  150.0 - 400.0 K/uL Final  . Neutrophils Relative % 02/07/2016 49.5  43.0 - 77.0 % Final  . Lymphocytes Relative 02/07/2016 35.1  12.0 - 46.0 % Final  . Monocytes Relative 02/07/2016 9.2  3.0 - 12.0 % Final  . Eosinophils Relative 02/07/2016 5.7* 0.0 - 5.0 % Final  . Basophils Relative 02/07/2016 0.5  0.0 - 3.0 % Final  . Neutro Abs 02/07/2016 3.2  1.4 - 7.7 K/uL Final  . Lymphs Abs 02/07/2016 2.3  0.7 - 4.0  K/uL Final  . Monocytes Absolute 02/07/2016 0.6  0.1 - 1.0 K/uL Final  . Eosinophils Absolute 02/07/2016 0.4  0.0 - 0.7 K/uL Final  . Basophils Absolute 02/07/2016 0.0  0.0 - 0.1 K/uL Final  . Sodium 02/07/2016 138  135 - 145 mEq/L Final  . Potassium 02/07/2016 3.8  3.5 - 5.1 mEq/L Final  . Chloride 02/07/2016 104  96 - 112 mEq/L Final  . CO2 02/07/2016 30  19 - 32 mEq/L Final  . Glucose, Bld 02/07/2016 92  70 - 99 mg/dL Final  . BUN 02/07/2016 17  6 - 23 mg/dL Final  . Creatinine, Ser 02/07/2016 0.67  0.40 - 1.20 mg/dL Final  . Total Bilirubin 02/07/2016 0.6  0.2 - 1.2 mg/dL Final  . Alkaline Phosphatase 02/07/2016 38* 39 - 117 U/L Final  . AST 02/07/2016 21  0 - 37 U/L Final  . ALT 02/07/2016 23  0 - 35 U/L Final  . Total Protein 02/07/2016 7.0  6.0 - 8.3 g/dL Final  . Albumin 02/07/2016 4.5  3.5 - 5.2 g/dL Final  . Calcium 02/07/2016 9.6  8.4 - 10.5 mg/dL Final  . GFR 02/07/2016 94.40  >60.00 mL/min Final  . TSH 02/07/2016 3.76  0.35 - 4.50 uIU/mL Final  . Cholesterol 02/07/2016 203* 0 - 200 mg/dL Final   ATP III Classification        Desirable:  < 200 mg/dL               Borderline High:  200 - 239 mg/dL          High:  > = 240 mg/dL  . Triglycerides 02/07/2016 78.0  0.0 - 149.0 mg/dL Final   Normal:  <150 mg/dLBorderline High:  150 - 199 mg/dL  . HDL 02/07/2016 69.50  >39.00 mg/dL Final  . VLDL 02/07/2016 15.6  0.0 - 40.0 mg/dL Final  . LDL Cholesterol 02/07/2016 118* 0 - 99 mg/dL Final  . Total CHOL/HDL Ratio 02/07/2016 3   Final                  Men          Women1/2 Average Risk     3.4          3.3Average Risk          5.0          4.42X Average Risk          9.6          7.13X Average Risk          15.0          11.0                      . NonHDL 02/07/2016 133.35   Final   NOTE:  Non-HDL goal should be 30 mg/dL higher than patient's LDL goal (i.e. LDL goal of < 70 mg/dL, would have non-HDL goal of < 100 mg/dL)  . VITD 02/07/2016 54.74  30.00 - 100.00 ng/mL Final  . Hgb A1c MFr Bld 02/07/2016 5.6  4.6 - 6.5 % Final   Glycemic Control Guidelines for People with Diabetes:Non Diabetic:  <6%Goal of Therapy: <7%Additional Action Suggested:  >8%     STUDIES: No results found.  ASSESSMENT:  Small lymphocytic lymphoma.  PLAN:  1. SLL, B-cell type. Patient remains asymptomatic and lab remains within normal limits. Patient's most recent PET scan was performed in December 2016, at that time patient had  a mild hyper metabolism within a left level III lymph node. The lymph node was approximately 1.2 cm with an SUV max of 3.8. Lymph node is currently not palpable but would like to continue to monitor with another PET scan in December 2017.  We'll schedule PET scan prior to next follow-up appointment in approximately 6 months.  Patient expressed understanding and was in agreement with this plan. She also understands that She can call clinic at any time with any questions, concerns, or complaints.   Dr. Grayland Ormond was available for consultation and review of plan of care for this patient.  Evlyn Kanner, NP   02/11/2016  3:39 PM

## 2016-02-20 ENCOUNTER — Other Ambulatory Visit: Payer: Self-pay | Admitting: Family Medicine

## 2016-02-20 DIAGNOSIS — Z1231 Encounter for screening mammogram for malignant neoplasm of breast: Secondary | ICD-10-CM

## 2016-05-08 ENCOUNTER — Encounter: Payer: Self-pay | Admitting: *Deleted

## 2016-05-11 ENCOUNTER — Ambulatory Visit: Payer: BLUE CROSS/BLUE SHIELD | Admitting: Anesthesiology

## 2016-05-11 ENCOUNTER — Ambulatory Visit
Admission: RE | Admit: 2016-05-11 | Discharge: 2016-05-11 | Disposition: A | Discharge status: Home / Self Care | Payer: BLUE CROSS/BLUE SHIELD | Source: Ambulatory Visit | Attending: Unknown Physician Specialty | Admitting: Unknown Physician Specialty

## 2016-05-11 ENCOUNTER — Encounter
Admission: RE | Disposition: A | Discharge status: Home / Self Care | Payer: Self-pay | Source: Ambulatory Visit | Attending: Unknown Physician Specialty

## 2016-05-11 DIAGNOSIS — Z818 Family history of other mental and behavioral disorders: Secondary | ICD-10-CM | POA: Insufficient documentation

## 2016-05-11 DIAGNOSIS — Z79899 Other long term (current) drug therapy: Secondary | ICD-10-CM | POA: Insufficient documentation

## 2016-05-11 DIAGNOSIS — Z8371 Family history of colonic polyps: Secondary | ICD-10-CM | POA: Insufficient documentation

## 2016-05-11 DIAGNOSIS — Z880 Allergy status to penicillin: Secondary | ICD-10-CM | POA: Insufficient documentation

## 2016-05-11 DIAGNOSIS — F329 Major depressive disorder, single episode, unspecified: Secondary | ICD-10-CM | POA: Diagnosis not present

## 2016-05-11 DIAGNOSIS — Z833 Family history of diabetes mellitus: Secondary | ICD-10-CM | POA: Diagnosis not present

## 2016-05-11 DIAGNOSIS — C9110 Chronic lymphocytic leukemia of B-cell type not having achieved remission: Secondary | ICD-10-CM | POA: Diagnosis not present

## 2016-05-11 DIAGNOSIS — Z811 Family history of alcohol abuse and dependence: Secondary | ICD-10-CM | POA: Diagnosis not present

## 2016-05-11 DIAGNOSIS — Z888 Allergy status to other drugs, medicaments and biological substances status: Secondary | ICD-10-CM | POA: Insufficient documentation

## 2016-05-11 DIAGNOSIS — Z1211 Encounter for screening for malignant neoplasm of colon: Secondary | ICD-10-CM | POA: Diagnosis present

## 2016-05-11 DIAGNOSIS — Z91030 Bee allergy status: Secondary | ICD-10-CM | POA: Insufficient documentation

## 2016-05-11 DIAGNOSIS — K640 First degree hemorrhoids: Secondary | ICD-10-CM | POA: Diagnosis not present

## 2016-05-11 DIAGNOSIS — Z806 Family history of leukemia: Secondary | ICD-10-CM | POA: Diagnosis not present

## 2016-05-11 DIAGNOSIS — F419 Anxiety disorder, unspecified: Secondary | ICD-10-CM | POA: Insufficient documentation

## 2016-05-11 DIAGNOSIS — I10 Essential (primary) hypertension: Secondary | ICD-10-CM | POA: Diagnosis not present

## 2016-05-11 DIAGNOSIS — Z8249 Family history of ischemic heart disease and other diseases of the circulatory system: Secondary | ICD-10-CM | POA: Diagnosis not present

## 2016-05-11 DIAGNOSIS — E890 Postprocedural hypothyroidism: Secondary | ICD-10-CM | POA: Diagnosis not present

## 2016-05-11 DIAGNOSIS — E785 Hyperlipidemia, unspecified: Secondary | ICD-10-CM | POA: Diagnosis not present

## 2016-05-11 DIAGNOSIS — K5730 Diverticulosis of large intestine without perforation or abscess without bleeding: Secondary | ICD-10-CM | POA: Insufficient documentation

## 2016-05-11 HISTORY — PX: COLONOSCOPY WITH PROPOFOL: SHX5780

## 2016-05-11 SURGERY — COLONOSCOPY WITH PROPOFOL
Anesthesia: General

## 2016-05-11 MED ORDER — SODIUM CHLORIDE 0.9 % IV SOLN
INTRAVENOUS | Status: DC
  Administered 2016-05-11: 14:00:00 via INTRAVENOUS

## 2016-05-11 MED ORDER — MIDAZOLAM HCL 5 MG/5ML IJ SOLN
INTRAMUSCULAR | Status: DC | PRN
  Administered 2016-05-11: 1 mg via INTRAVENOUS

## 2016-05-11 MED ORDER — FENTANYL CITRATE (PF) 100 MCG/2ML IJ SOLN
INTRAMUSCULAR | Status: DC | PRN
  Administered 2016-05-11: 50 ug via INTRAVENOUS

## 2016-05-11 MED ORDER — PROPOFOL 500 MG/50ML IV EMUL
INTRAVENOUS | Status: DC | PRN
  Administered 2016-05-11: 100 ug/kg/min via INTRAVENOUS

## 2016-05-11 MED ORDER — LIDOCAINE HCL (PF) 2 % IJ SOLN
INTRAMUSCULAR | Status: DC | PRN
  Administered 2016-05-11: 50 mg

## 2016-05-11 MED ORDER — PROPOFOL 10 MG/ML IV BOLUS
INTRAVENOUS | Status: DC | PRN
Start: 2016-05-11 — End: 2016-05-11
  Administered 2016-05-11: 30 mg via INTRAVENOUS
  Administered 2016-05-11 (×3): 20 mg via INTRAVENOUS

## 2016-05-11 NOTE — Anesthesia Preprocedure Evaluation (Signed)
Anesthesia Evaluation  Patient identified by MRN, date of birth, ID band Patient awake    Reviewed: Allergy & Precautions, H&P , NPO status , Patient's Chart, lab work & pertinent test results, reviewed documented beta blocker date and time   Airway Mallampati: II   Neck ROM: full    Dental  (+) Poor Dentition   Pulmonary neg pulmonary ROS,    Pulmonary exam normal        Cardiovascular hypertension, negative cardio ROS Normal cardiovascular exam Rhythm:regular Rate:Normal     Neuro/Psych PSYCHIATRIC DISORDERS negative neurological ROS  negative psych ROS   GI/Hepatic negative GI ROS, Neg liver ROS,   Endo/Other  negative endocrine ROS  Renal/GU negative Renal ROS  negative genitourinary   Musculoskeletal   Abdominal   Peds  Hematology negative hematology ROS (+)   Anesthesia Other Findings Past Medical History: No date: Anxiety No date: Cancer [facility])     Comment: B CELL LYMPHOMA 02/11/2016: Chronic lymphocytic leukemia (CLL), B-cell ([place]* No date: Depression No date: History of anxiety No date: History of depression No date: History of fibrocystic disease of breast No date: History of hyperlipidemia No date: Hypertension No date: Situational stress Past Surgical History: 12/2005: COLONOSCOPY     Comment: polyps, tics 08/20/2015: THYROIDECTOMY Left     Comment: Procedure: THYROIDECTOMY/ HEMITHYROIDECTOMY;                Surgeon: Beverly Gust, MD;  Location: [facility]               ORS;  Service: ENT;  Laterality: Left; No date: TUBAL LIGATION BMI    Body Mass Index:  24.13 kg/m     Reproductive/Obstetrics                             Anesthesia Physical Anesthesia Plan  ASA: II  Anesthesia Plan: General   Post-op Pain Management:    Induction:   Airway Management Planned:   Additional Equipment:   Intra-op Plan:   Post-operative Plan:   Informed Consent: I have  reviewed the patients History and Physical, chart, labs and discussed the procedure including the risks, benefits and alternatives for the proposed anesthesia with the patient or authorized representative who has indicated his/her understanding and acceptance.   Dental Advisory Given  Plan Discussed with: CRNA  Anesthesia Plan Comments:         Anesthesia Quick Evaluation

## 2016-05-11 NOTE — Op Note (Signed)
[facility] Patient Name: Anita Buckley Procedure Date: 05/11/2016 3:33 PM MRN: [id number] Account #: [id number] Date of Birth: November 16, 1950 Admit Type: Outpatient Age: 65 Room: [facility] ENDO ROOM 4 Gender: Female Note Status: Finalized Procedure:            Colonoscopy Indications:          Colon cancer screening in patient at increased risk:                        Family history of 1st-degree relative with colon polyps Providers:            Manya Silvas, MD Referring MD:         Wynelle Fanny. [place] (Referring MD) Medicines:            Propofol per Anesthesia Complications:        No immediate complications. Procedure:            Pre-Anesthesia Assessment:                       - After reviewing the risks and benefits, the patient                        was deemed in satisfactory condition to undergo the                        procedure.                       After obtaining informed consent, the colonoscope was                        passed under direct vision. Throughout the procedure,                        the patient's blood pressure, pulse, and oxygen                        saturations were monitored continuously. The                        Colonoscope was introduced through the anus and                        advanced to the the cecum, identified by appendiceal                        orifice and ileocecal valve. The colonoscopy was                        technically difficult and complex due to significant                        looping. The patient tolerated the procedure well. The                        quality of the bowel preparation was excellent.                       The colon was long and redundant and I had to start  over when I was about half way throught due to looping Findings:      Multiple small and large-mouthed diverticula were found in the sigmoid       colon and descending colon.      Internal  hemorrhoids were found during endoscopy. The hemorrhoids were       small and Grade I (internal hemorrhoids that do not prolapse). Impression:           - Diverticulosis in the sigmoid colon and in the                        descending colon.                       - Internal hemorrhoids.                       - No specimens collected. Recommendation:       - Repeat colonoscopy in 5 years for screening purposes. Manya Silvas, MD 05/11/2016 4:11:15 PM This report has been signed electronically. Number of Addenda: 0 Note Initiated On: 05/11/2016 3:33 PM Scope Withdrawal Time: 0 hours 6 minutes 51 seconds  Total Procedure Duration: 0 hours 29 minutes 35 seconds       [facility]

## 2016-05-11 NOTE — H&P (Signed)
Primary Care Physician:  Loura Pardon, MD Primary Gastroenterologist:  Dr. Vira Agar  Pre-Procedure History & Physical: HPI:  Anita Buckley is a 65 y.o. female is here for an colonoscopy.   Past Medical History:  Diagnosis Date  . Anxiety   . Cancer ([place])    B CELL LYMPHOMA  . Chronic lymphocytic leukemia (CLL), B-cell ([place]) 02/11/2016  . Depression   . History of anxiety   . History of depression   . History of fibrocystic disease of breast   . History of hyperlipidemia   . Hypertension   . Situational stress     Past Surgical History:  Procedure Laterality Date  . COLONOSCOPY  12/2005   polyps, tics  . THYROIDECTOMY Left 08/20/2015   Procedure: THYROIDECTOMY/ HEMITHYROIDECTOMY;  Surgeon: Beverly Gust, MD;  Location: [facility] ORS;  Service: ENT;  Laterality: Left;  . TUBAL LIGATION      Prior to Admission medications   Medication Sig Start Date End Date Taking? Authorizing Provider  ALPRAZolam Duanne Moron) 0.5 MG tablet Take 1 tablet (0.5 mg total) by mouth daily as needed for anxiety. 02/07/16  Yes Abner Greenspan, MD  amLODipine (NORVASC) 5 MG tablet Take 1 tablet (5 mg total) by mouth daily. 02/07/16  Yes Abner Greenspan, MD  calcium carbonate (OS-CAL) 1250 MG chewable tablet Chew 1 tablet by mouth 3 (three) times daily.   Yes Historical Provider, MD  Loratadine 10 MG CAPS Take 1 capsule (10 mg total) by mouth daily. 02/07/16  Yes Abner Greenspan, MD  Melatonin 5 MG CAPS Take 1 capsule by mouth at bedtime as needed.   Yes Historical Provider, MD  alendronate (FOSAMAX) 70 MG tablet Take 1 tablet (70 mg total) by mouth once a week. Take with a full glass of water on an empty stomach. 02/07/16   Abner Greenspan, MD  Cholecalciferol (VITAMIN D-3) 5000 UNITS TABS Take 1 tablet by mouth daily.    Historical Provider, MD  EPINEPHrine (EPIPEN 2-PAK) 0.3 mg/0.3 mL IJ SOAJ injection Inject 0.3 mLs (0.3 mg total) into the muscle as needed. Patient not taking: Reported on 05/11/2016 02/07/16   Abner Greenspan, MD  famotidine (PEPCID) 10 MG tablet Take 1 tablet (10 mg total) by mouth daily. 02/07/16   Abner Greenspan, MD    Allergies as of 03/27/2016 - Review Complete 02/11/2016  Allergen Reaction Noted  . Bee venom  05/29/2013  . Buspirone hcl  05/20/2009  . Minocycline Nausea And Vomiting 04/24/2015  . Penicillins  03/30/2007    Family History  Problem Relation Age of Onset  . Diabetes Father     late in life  . Alcohol abuse Father   . Hyperlipidemia Mother   . Mental illness Mother   . Depression Sister   . Leukemia Brother   . Schizophrenia Other     Grandfather    Social History   Social History  . Marital status: Married    Spouse name: N/A  . Number of children: N/A  . Years of education: N/A   Occupational History  . Not on file.   Social History Main Topics  . Smoking status: Never Smoker  . Smokeless tobacco: Never Used  . Alcohol use 0.0 oz/week     Comment: saturday  . Drug use: No  . Sexual activity: Not on file   Other Topics Concern  . Not on file   Social History Narrative  . No narrative on file    Review  of Systems: See HPI, otherwise negative ROS  Physical Exam: BP (!) 130/91   Pulse 75   Temp 98 F (36.7 C) (Oral)   Resp 20   Ht 5\' 5"  (1.651 m)   Wt 65.8 kg (145 lb)   SpO2 99%   BMI 24.13 kg/m  General:   Alert,  pleasant and cooperative in NAD Head:  Normocephalic and atraumatic. Neck:  Supple; no masses or thyromegaly. Lungs:  Clear throughout to auscultation.    Heart:  Regular rate and rhythm. Abdomen:  Soft, nontender and nondistended. Normal bowel sounds, without guarding, and without rebound.   Neurologic:  Alert and  oriented x4;  grossly normal neurologically.  Impression/Plan: Anita Buckley is here for an colonoscopy to be performed for screening and family history of polyps  Risks, benefits, limitations, and alternatives regarding  colonoscopy have been reviewed with the patient.  Questions have been  answered.  All parties agreeable.   Gaylyn Cheers, MD  05/11/2016, 2:49 PM

## 2016-05-11 NOTE — Transfer of Care (Signed)
Immediate Anesthesia Transfer of Care Note  Patient: Anita Buckley  Procedure(s) Performed: Procedure(s): COLONOSCOPY WITH PROPOFOL (N/A)  Patient Location: PACU  Anesthesia Type:General  Level of Consciousness: sedated  Airway & Oxygen Therapy: Patient Spontanous Breathing and Patient connected to nasal cannula oxygen  Post-op Assessment: Report given to RN and Post -op Vital signs reviewed and stable  Post vital signs: Reviewed and stable  Last Vitals:  Vitals:   05/11/16 1414  BP: (!) 130/91  Pulse: 75  Resp: 20  Temp: 36.7 C    Last Pain:  Vitals:   05/11/16 1414  TempSrc: Oral         Complications: No apparent anesthesia complications

## 2016-05-12 ENCOUNTER — Encounter: Payer: Self-pay | Admitting: Unknown Physician Specialty

## 2016-05-12 NOTE — Anesthesia Postprocedure Evaluation (Signed)
Anesthesia Post Note  Patient: Anita Buckley  Procedure(s) Performed: Procedure(s) (LRB): COLONOSCOPY WITH PROPOFOL (N/A)  Patient location during evaluation: Endoscopy Anesthesia Type: General Level of consciousness: awake and alert Pain management: pain level controlled Vital Signs Assessment: post-procedure vital signs reviewed and stable Respiratory status: spontaneous breathing, nonlabored ventilation, respiratory function stable and patient connected to nasal cannula oxygen Cardiovascular status: blood pressure returned to baseline and stable Postop Assessment: no signs of nausea or vomiting Anesthetic complications: no    Last Vitals:  Vitals:   05/11/16 1634 05/11/16 1644  BP: 106/77 118/66  Pulse:    Resp:    Temp:      Last Pain:  Vitals:   05/11/16 1614  TempSrc: Tympanic                 Martha Clan

## 2016-05-26 ENCOUNTER — Ambulatory Visit
Admission: RE | Admit: 2016-05-26 | Discharge: 2016-05-26 | Disposition: A | Discharge status: Home / Self Care | Payer: BLUE CROSS/BLUE SHIELD | Source: Ambulatory Visit | Attending: Family Medicine | Admitting: Family Medicine

## 2016-05-26 DIAGNOSIS — Z1231 Encounter for screening mammogram for malignant neoplasm of breast: Secondary | ICD-10-CM | POA: Diagnosis not present

## 2016-08-05 ENCOUNTER — Ambulatory Visit: Payer: BLUE CROSS/BLUE SHIELD

## 2016-08-06 ENCOUNTER — Telehealth: Payer: Self-pay | Admitting: *Deleted

## 2016-08-06 NOTE — Telephone Encounter (Signed)
Left message for patient r/t her appt on 08-20-16 and that Dr. Mike Gip didn't routinely do PET scans for her dx, but she would review her symptoms and labs in January and then make a decision.

## 2016-08-12 ENCOUNTER — Ambulatory Visit: Payer: BLUE CROSS/BLUE SHIELD | Admitting: Internal Medicine

## 2016-08-12 ENCOUNTER — Other Ambulatory Visit: Payer: BLUE CROSS/BLUE SHIELD

## 2016-08-13 ENCOUNTER — Inpatient Hospital Stay: Payer: BLUE CROSS/BLUE SHIELD | Admitting: Hematology and Oncology

## 2016-08-13 ENCOUNTER — Inpatient Hospital Stay: Payer: BLUE CROSS/BLUE SHIELD

## 2016-08-18 ENCOUNTER — Ambulatory Visit: Payer: BLUE CROSS/BLUE SHIELD

## 2016-08-18 ENCOUNTER — Other Ambulatory Visit: Payer: Self-pay | Admitting: Family Medicine

## 2016-08-20 ENCOUNTER — Ambulatory Visit: Payer: BLUE CROSS/BLUE SHIELD | Admitting: Hematology and Oncology

## 2016-08-20 ENCOUNTER — Other Ambulatory Visit: Payer: BLUE CROSS/BLUE SHIELD

## 2016-09-10 ENCOUNTER — Other Ambulatory Visit: Payer: Self-pay | Admitting: Family Medicine

## 2016-09-24 ENCOUNTER — Inpatient Hospital Stay: Payer: BLUE CROSS/BLUE SHIELD | Attending: Internal Medicine | Admitting: Hematology and Oncology

## 2016-09-24 ENCOUNTER — Inpatient Hospital Stay: Payer: BLUE CROSS/BLUE SHIELD

## 2016-09-24 VITALS — BP 131/87 | HR 59 | Temp 95.7°F | Resp 18 | Wt 148.8 lb

## 2016-09-24 DIAGNOSIS — I10 Essential (primary) hypertension: Secondary | ICD-10-CM | POA: Diagnosis not present

## 2016-09-24 DIAGNOSIS — C9110 Chronic lymphocytic leukemia of B-cell type not having achieved remission: Secondary | ICD-10-CM | POA: Diagnosis not present

## 2016-09-24 DIAGNOSIS — E042 Nontoxic multinodular goiter: Secondary | ICD-10-CM | POA: Diagnosis not present

## 2016-09-24 DIAGNOSIS — E785 Hyperlipidemia, unspecified: Secondary | ICD-10-CM

## 2016-09-24 DIAGNOSIS — K8020 Calculus of gallbladder without cholecystitis without obstruction: Secondary | ICD-10-CM | POA: Diagnosis not present

## 2016-09-24 DIAGNOSIS — Z79899 Other long term (current) drug therapy: Secondary | ICD-10-CM

## 2016-09-24 DIAGNOSIS — F419 Anxiety disorder, unspecified: Secondary | ICD-10-CM

## 2016-09-24 DIAGNOSIS — K760 Fatty (change of) liver, not elsewhere classified: Secondary | ICD-10-CM | POA: Insufficient documentation

## 2016-09-24 DIAGNOSIS — N200 Calculus of kidney: Secondary | ICD-10-CM | POA: Diagnosis not present

## 2016-09-24 DIAGNOSIS — F329 Major depressive disorder, single episode, unspecified: Secondary | ICD-10-CM

## 2016-09-24 LAB — COMPREHENSIVE METABOLIC PANEL
ALK PHOS: 43 U/L (ref 38–126)
ALT: 27 U/L (ref 14–54)
ANION GAP: 6 (ref 5–15)
AST: 28 U/L (ref 15–41)
Albumin: 4.3 g/dL (ref 3.5–5.0)
BUN: 17 mg/dL (ref 6–20)
CALCIUM: 9 mg/dL (ref 8.9–10.3)
CO2: 26 mmol/L (ref 22–32)
CREATININE: 0.77 mg/dL (ref 0.44–1.00)
Chloride: 104 mmol/L (ref 101–111)
Glucose, Bld: 96 mg/dL (ref 65–99)
Potassium: 3.7 mmol/L (ref 3.5–5.1)
Sodium: 136 mmol/L (ref 135–145)
TOTAL PROTEIN: 7.3 g/dL (ref 6.5–8.1)
Total Bilirubin: 0.6 mg/dL (ref 0.3–1.2)

## 2016-09-24 LAB — CBC WITH DIFFERENTIAL/PLATELET
Basophils Absolute: 0 10*3/uL (ref 0–0.1)
Basophils Relative: 1 %
EOS ABS: 0.2 10*3/uL (ref 0–0.7)
EOS PCT: 4 %
HCT: 39.3 % (ref 35.0–47.0)
HEMOGLOBIN: 13.2 g/dL (ref 12.0–16.0)
LYMPHS ABS: 2 10*3/uL (ref 1.0–3.6)
LYMPHS PCT: 34 %
MCH: 29.5 pg (ref 26.0–34.0)
MCHC: 33.7 g/dL (ref 32.0–36.0)
MCV: 87.6 fL (ref 80.0–100.0)
MONOS PCT: 10 %
Monocytes Absolute: 0.6 10*3/uL (ref 0.2–0.9)
Neutro Abs: 3.1 10*3/uL (ref 1.4–6.5)
Neutrophils Relative %: 51 %
PLATELETS: 215 10*3/uL (ref 150–440)
RBC: 4.49 MIL/uL (ref 3.80–5.20)
RDW: 13.2 % (ref 11.5–14.5)
WBC: 6.1 10*3/uL (ref 3.6–11.0)

## 2016-09-24 LAB — LACTATE DEHYDROGENASE: LDH: 128 U/L (ref 98–192)

## 2016-09-24 NOTE — Progress Notes (Signed)
Patient states PET was cancelled by insurance.  Wants to discuss this with MD.

## 2016-09-24 NOTE — Progress Notes (Signed)
[facility]-  Cancer Center  Clinic day:  09/24/2016  Chief Complaint: Anita Buckley is a 66 y.o. female with chronic lymphocytic leukemia who is seen for reassessment.  HPI:  The patient describes having a benign goiter in 05/2014.  She was referred to Dr. Tedd Sias of endocrinology. Ultrasound of the soft tissue head and neck on 05/15/2014 revealed homogeneous thyroid gland with several thyroid nodules. There were several enlarged left-sided neck lymph nodes. In the left lateral neck there was an atypical enlarged 3.34 cm lymph node.   She underwent fine needle aspirate of a left neck node in 05/2014.  Pathology revealed all lymphocytes consistent with chronic lymphocytic leukemia/small lymphocytic lymphoma.  Flow cytometry revealed a clonal population of lymphocytes CD5+, CD23+, CD43 and CD+ positive. Thyroid nodule aspirate was negative for malignant cells.  PET scan on 06/06/2014 revealed an abnormal 1.1 cm mildly hypermetabolic level III left neck node.  There was activity in smaller station IB lymph nodes. There were numerous scattered small cervical lymph nodes bilaterally. There was faintly hypermetabolic bilateral axillary and subpectoral lymph nodes. Only one had borderline increased activity.  She has been followed without intervention.  PET scan on 08/05/2015 revealed stable mild hypermetabolism within a 1.2 cm level III lymph node. There was hepatic steatosis and cholelithiasis. There was a tiny right renal stone.  She underwent left hemithyroidectomy on 08/20/2015 by Dr. Jenne Campus.  Pathology revealed lymphocytic thyroiditis.  The patient was last seen in the medical oncology clinic by Rhett Bannister on 02/11/2016.  At that time, she was doing well.  Symptomatically, she continues to do well.  She denies any B symptoms.  She denies any adenopathy, bruising or bleeding.  She denies any issues with infections.  She is up-to-date on her mammogram.   Past Medical  History:  Diagnosis Date  . Anxiety   . Cancer (HCC)    B CELL LYMPHOMA  . Chronic lymphocytic leukemia (CLL), B-cell (HCC) 02/11/2016  . Depression   . History of anxiety   . History of depression   . History of fibrocystic disease of breast   . History of hyperlipidemia   . Hypertension   . Situational stress     Past Surgical History:  Procedure Laterality Date  . COLONOSCOPY  12/2005   polyps, tics  . COLONOSCOPY WITH PROPOFOL N/A 05/11/2016   Procedure: COLONOSCOPY WITH PROPOFOL;  Surgeon: Scot Jun, MD;  Location: [facility] ENDOSCOPY;  Service: Endoscopy;  Laterality: N/A;  . THYROIDECTOMY Left 08/20/2015   Procedure: THYROIDECTOMY/ HEMITHYROIDECTOMY;  Surgeon: Linus Salmons, MD;  Location: [facility] ORS;  Service: ENT;  Laterality: Left;  . TUBAL LIGATION      Family History  Problem Relation Age of Onset  . Diabetes Father     late in life  . Alcohol abuse Father   . Hyperlipidemia Mother   . Mental illness Mother   . Depression Sister   . Leukemia Brother   . Schizophrenia Other     Grandfather    Social History:  reports that she has never smoked. She has never used smokeless tobacco. She reports that she drinks alcohol. She reports that she does not use drugs.  She lives in [place].  Her husband is a Armed forces operational officer.  The patient is accompanied by her husband, Rosanne Ashing,  today.  Allergies:  Allergies  Allergen Reactions  . Bee Venom     Anaphylaxis   . Buspirone Hcl     REACTION: did not like  . Minocycline  Nausea And Vomiting  . Penicillins     REACTION: reaction as a child    Current Medications: Current Outpatient Prescriptions  Medication Sig Dispense Refill  . alendronate (FOSAMAX) 70 MG tablet TAKE ONE TABLET BY MOUTH ONCE A WEEK WITH A FULL GLASS OF WATER ON AN EMPTY STOMACH 4 tablet 5  . ALPRAZolam (XANAX) 0.5 MG tablet Take 1 tablet (0.5 mg total) by mouth daily as needed for anxiety. 30 tablet 3  . amLODipine (NORVASC) 5 MG tablet Take 1 tablet (5  mg total) by mouth daily. 90 tablet 3  . calcium carbonate (OS-CAL) 1250 MG chewable tablet Chew 1 tablet by mouth 3 (three) times daily.    . Cholecalciferol (VITAMIN D-3) 5000 UNITS TABS Take 1 tablet by mouth daily.    . Melatonin 5 MG CAPS Take 1 capsule by mouth at bedtime as needed.    Marland Kitchen EPINEPHrine (EPIPEN 2-PAK) 0.3 mg/0.3 mL IJ SOAJ injection Inject 0.3 mLs (0.3 mg total) into the muscle as needed. (Patient not taking: Reported on 05/11/2016) 1 Device 5  . famotidine (PEPCID) 10 MG tablet Take 1 tablet (10 mg total) by mouth daily. (Patient not taking: Reported on 09/24/2016)    . Loratadine 10 MG CAPS Take 1 capsule (10 mg total) by mouth daily. (Patient not taking: Reported on 09/24/2016) 30 each    No current facility-administered medications for this visit.     Review of Systems:  GENERAL:  Feels good.  Active.  No fevers, sweats or weight loss. PERFORMANCE STATUS (ECOG):  1 HEENT:  No visual changes, runny nose, sore throat, mouth sores or tenderness. Lungs: No shortness of breath or cough.  No hemoptysis. Cardiac:  No chest pain, palpitations, orthopnea, or PND. GI:  No nausea, vomiting, diarrhea, constipation, melena or hematochezia. GU:  No urgency, frequency, dysuria, or hematuria. Musculoskeletal:  No back pain. Bursitis in hips treated with meloxicam.  No muscle tenderness. Extremities:  No pain or swelling. Skin:  No rashes or skin changes. Neuro:  No headache, numbness or weakness, balance or coordination issues. Endocrine:  No diabetes, thyroid issues, hot flashes or night sweats. Psych:  No mood changes, depression or anxiety. Pain:  No focal pain. Review of systems:  All other systems reviewed and found to be negative.  Physical Exam: Blood pressure 131/87, pulse (!) 59, temperature (!) 95.7 F (35.4 C), temperature source Tympanic, resp. rate 18, weight 148 lb 13 oz (67.5 kg). GENERAL:  Well developed, well nourished, woman sitting comfortably in the exam room in  no acute distress. MENTAL STATUS:  Alert and oriented to person, place and time. HEAD:  Brown hair.  Normocephalic, atraumatic, face symmetric, no Cushingoid features. EYES:  Pupils equal round and reactive to light and accomodation.  No conjunctivitis or scleral icterus. ENT:  Oropharynx clear without lesion.  Tongue normal. Mucous membranes moist.  RESPIRATORY:  Clear to auscultation without rales, wheezes or rhonchi. CARDIOVASCULAR:  Regular rate and rhythm without murmur, rub or gallop. ABDOMEN:  Soft, non-tender, with active bowel sounds, and no hepatosplenomegaly.  No masses. SKIN:  No rashes, ulcers or lesions. EXTREMITIES: No edema, no skin discoloration or tenderness.  No palpable cords. LYMPH NODES:  Bilateral 1.5-2 cm axillary adenopathy.  No palpable cervical, supraclavicular, or inguinal adenopathy  NEUROLOGICAL: Unremarkable. PSYCH:  Appropriate.   Appointment on 09/24/2016  Component Date Value Ref Range Status  . WBC 09/24/2016 6.1  3.6 - 11.0 K/uL Final  . RBC 09/24/2016 4.49  3.80 - 5.20 MIL/uL  Final  . Hemoglobin 09/24/2016 13.2  12.0 - 16.0 g/dL Final  . HCT 09/24/2016 39.3  35.0 - 47.0 % Final  . MCV 09/24/2016 87.6  80.0 - 100.0 fL Final  . MCH 09/24/2016 29.5  26.0 - 34.0 pg Final  . MCHC 09/24/2016 33.7  32.0 - 36.0 g/dL Final  . RDW 09/24/2016 13.2  11.5 - 14.5 % Final  . Platelets 09/24/2016 215  150 - 440 K/uL Final  . Neutrophils Relative % 09/24/2016 51  % Final  . Neutro Abs 09/24/2016 3.1  1.4 - 6.5 K/uL Final  . Lymphocytes Relative 09/24/2016 34  % Final  . Lymphs Abs 09/24/2016 2.0  1.0 - 3.6 K/uL Final  . Monocytes Relative 09/24/2016 10  % Final  . Monocytes Absolute 09/24/2016 0.6  0.2 - 0.9 K/uL Final  . Eosinophils Relative 09/24/2016 4  % Final  . Eosinophils Absolute 09/24/2016 0.2  0 - 0.7 K/uL Final  . Basophils Relative 09/24/2016 1  % Final  . Basophils Absolute 09/24/2016 0.0  0 - 0.1 K/uL Final  . Sodium 09/24/2016 136  135 - 145  mmol/L Final  . Potassium 09/24/2016 3.7  3.5 - 5.1 mmol/L Final  . Chloride 09/24/2016 104  101 - 111 mmol/L Final  . CO2 09/24/2016 26  22 - 32 mmol/L Final  . Glucose, Bld 09/24/2016 96  65 - 99 mg/dL Final  . BUN 09/24/2016 17  6 - 20 mg/dL Final  . Creatinine, Ser 09/24/2016 0.77  0.44 - 1.00 mg/dL Final  . Calcium 09/24/2016 9.0  8.9 - 10.3 mg/dL Final  . Total Protein 09/24/2016 7.3  6.5 - 8.1 g/dL Final  . Albumin 09/24/2016 4.3  3.5 - 5.0 g/dL Final  . AST 09/24/2016 28  15 - 41 U/L Final  . ALT 09/24/2016 27  14 - 54 U/L Final  . Alkaline Phosphatase 09/24/2016 43  38 - 126 U/L Final  . Total Bilirubin 09/24/2016 0.6  0.3 - 1.2 mg/dL Final  . GFR calc non Af Amer 09/24/2016 >60  >60 mL/min Final  . GFR calc Af Amer 09/24/2016 >60  >60 mL/min Final   Comment: (NOTE) The eGFR has been calculated using the CKD EPI equation. This calculation has not been validated in all clinical situations. eGFR's persistently <60 mL/min signify possible Chronic Kidney Disease.   . Anion gap 09/24/2016 6  5 - 15 Final  . LDH 09/24/2016 128  98 - 192 U/L Final    Assessment:  KAILI CASTILLE is a 66 y.o. female with chronic lymphocytic leukemia (CLL).  She was diagnosed in 2015 after presenting with a goiter.  She has been followed without intervention.  Ultrasound of the soft tissue head and neck on 05/15/2014 revealed homogeneous thyroid gland with several thyroid nodules. There were several enlarged left-sided neck lymph nodes. In the left lateral neck there was an atypical enlarged 3.34 cm lymph node.   Fine needle aspirate of a left neck node in 05/2014 revealed all lymphocytes consistent with chronic lymphocytic leukemia/small lymphocytic lymphoma.  Flow cytometry revealed a clonal population of lymphocytes CD5+, CD23+, CD43 and CD+ positive. Thyroid nodule aspirate was negative for malignant cells.  PET scan on 06/06/2014 revealed an abnormal 1.1 cm mildly hypermetabolic level III left  neck node.  There was activity in smaller station IB lymph nodes. There were numerous scattered small cervical lymph nodes bilaterally. There was faintly hypermetabolic bilateral axillary and subpectoral lymph nodes. Only one had borderline increased activity.  PET scan on 08/05/2015 revealed stable mild hypermetabolism within a 1.2 cm level III lymph node. There was hepatic steatosis and cholelithiasis. There was a tiny right renal stone.  She underwent left hemithyroidectomy on 08/20/2015.  Pathology revealed lymphocytic thyroiditis.  She is up-to-date on her mammogram.  Symptomatically, denies any B symptoms.  Exam reveals bilateral small axillary adenopathy.  Plan: 1.  Discuss entire medical history, diagnosis, staging, and management of CLL.  Discuss indications for treatment.  Discuss prior PET scans.  Discuss CLL as a slow growing disease, usually PET negative or low FDG avid.  Discuss examination with palpable axillary adenopathy.  Discuss plan for chest CT. 2.  Labs today:  CBC with diff, CMP, LDH. 3.  Chest CT without contrast re: assess adenopathy. 4.  RTC after CT for MD assessment and discussion regarding direction of therapy.   Lequita Asal, MD  09/24/2016, 11:22 AM

## 2016-09-28 ENCOUNTER — Ambulatory Visit
Admission: RE | Admit: 2016-09-28 | Discharge: 2016-09-28 | Disposition: A | Discharge status: Home / Self Care | Payer: BLUE CROSS/BLUE SHIELD | Source: Ambulatory Visit | Attending: Hematology and Oncology | Admitting: Hematology and Oncology

## 2016-09-28 DIAGNOSIS — R590 Localized enlarged lymph nodes: Secondary | ICD-10-CM | POA: Diagnosis not present

## 2016-09-28 DIAGNOSIS — C9110 Chronic lymphocytic leukemia of B-cell type not having achieved remission: Secondary | ICD-10-CM | POA: Insufficient documentation

## 2016-09-29 ENCOUNTER — Encounter: Payer: Self-pay | Admitting: Hematology and Oncology

## 2016-09-29 ENCOUNTER — Inpatient Hospital Stay (HOSPITAL_BASED_OUTPATIENT_CLINIC_OR_DEPARTMENT_OTHER): Payer: BLUE CROSS/BLUE SHIELD | Admitting: Hematology and Oncology

## 2016-09-29 VITALS — BP 146/98 | HR 58 | Temp 95.0°F | Wt 149.7 lb

## 2016-09-29 DIAGNOSIS — E785 Hyperlipidemia, unspecified: Secondary | ICD-10-CM | POA: Diagnosis not present

## 2016-09-29 DIAGNOSIS — K760 Fatty (change of) liver, not elsewhere classified: Secondary | ICD-10-CM

## 2016-09-29 DIAGNOSIS — I10 Essential (primary) hypertension: Secondary | ICD-10-CM | POA: Diagnosis not present

## 2016-09-29 DIAGNOSIS — F329 Major depressive disorder, single episode, unspecified: Secondary | ICD-10-CM

## 2016-09-29 DIAGNOSIS — K8020 Calculus of gallbladder without cholecystitis without obstruction: Secondary | ICD-10-CM | POA: Diagnosis not present

## 2016-09-29 DIAGNOSIS — R419 Unspecified symptoms and signs involving cognitive functions and awareness: Secondary | ICD-10-CM | POA: Diagnosis not present

## 2016-09-29 DIAGNOSIS — N200 Calculus of kidney: Secondary | ICD-10-CM

## 2016-09-29 DIAGNOSIS — Z79899 Other long term (current) drug therapy: Secondary | ICD-10-CM

## 2016-09-29 DIAGNOSIS — E042 Nontoxic multinodular goiter: Secondary | ICD-10-CM | POA: Diagnosis not present

## 2016-09-29 DIAGNOSIS — C9110 Chronic lymphocytic leukemia of B-cell type not having achieved remission: Secondary | ICD-10-CM

## 2016-09-29 NOTE — Progress Notes (Signed)
[place] Clinic day:  09/29/2016  Chief Complaint: Anita Buckley is a 66 y.o. female with chronic lymphocytic leukemia who is seen for review of interval chest CT.  HPI:  The patient was last seen in the medical oncology clinic on 09/24/2016 for initial assessment by me.  At that time, she was doing well.  She denied any B symptoms.  Exam revealed bilateral small palpable axillary adenopathy.  CBC with diff, CMP, and LDH were normal.  Chest CT on 09/28/2016 revealed stable mild thoracic lymphadenopathy compatible with known CLL.  There was a 12 mm short axis left supraclavicular node, hypermetabolic on prior PET.  The bilateral axillary nodes were non FDG avid on prior PET scan.  Symptomatically, she denies any complaints.  She did not take her blood pressure medication this morning.   Past Medical History:  Diagnosis Date  . Anxiety   . Cancer ([place])    B CELL LYMPHOMA  . Chronic lymphocytic leukemia (CLL), B-cell ([place]) 02/11/2016  . Depression   . History of anxiety   . History of depression   . History of fibrocystic disease of breast   . History of hyperlipidemia   . Hypertension   . Situational stress     Past Surgical History:  Procedure Laterality Date  . COLONOSCOPY  12/2005   polyps, tics  . COLONOSCOPY WITH PROPOFOL N/A 05/11/2016   Procedure: COLONOSCOPY WITH PROPOFOL;  Surgeon: Manya Silvas, MD;  Location[facility] ENDOSCOPY;  Service: Endoscopy;  Laterality: N/A;  . THYROIDECTOMY Left 08/20/2015   Procedure: THYROIDECTOMY/ HEMITHYROIDECTOMY;  Surgeon: Beverly Gust, MD;  Location: [facility] ORS;  Service: ENT;  Laterality: Left;  . TUBAL LIGATION      Family History  Problem Relation Age of Onset  . Diabetes Father     late in life  . Alcohol abuse Father   . Hyperlipidemia Mother   . Mental illness Mother   . Depression Sister   . Leukemia Brother   . Schizophrenia Other     Grandfather    Social History:  reports  that she has never smoked. She has never used smokeless tobacco. She reports that she drinks alcohol. She reports that she does not use drugs.  She lives in [place].  Her husband is a Paediatric nurse.  Allergies:  Allergies  Allergen Reactions  . Bee Venom     Anaphylaxis   . Buspirone Hcl     REACTION: did not like  . Minocycline Nausea And Vomiting  . Penicillins     REACTION: reaction as a child    Current Medications: Current Outpatient Prescriptions  Medication Sig Dispense Refill  . alendronate (FOSAMAX) 70 MG tablet TAKE ONE TABLET BY MOUTH ONCE A WEEK WITH A FULL GLASS OF WATER ON AN EMPTY STOMACH 4 tablet 5  . ALPRAZolam (XANAX) 0.5 MG tablet Take 1 tablet (0.5 mg total) by mouth daily as needed for anxiety. 30 tablet 3  . amLODipine (NORVASC) 5 MG tablet Take 1 tablet (5 mg total) by mouth daily. 90 tablet 3  . calcium carbonate (OS-CAL) 1250 MG chewable tablet Chew 1 tablet by mouth 3 (three) times daily.    . Cholecalciferol (VITAMIN D-3) 5000 UNITS TABS Take 1 tablet by mouth daily.    Marland Kitchen EPINEPHrine (EPIPEN 2-PAK) 0.3 mg/0.3 mL IJ SOAJ injection Inject 0.3 mLs (0.3 mg total) into the muscle as needed. 1 Device 5  . Melatonin 5 MG CAPS Take 1 capsule by mouth  at bedtime as needed.    . famotidine (PEPCID) 10 MG tablet Take 1 tablet (10 mg total) by mouth daily. (Patient not taking: Reported on 09/24/2016)    . Loratadine 10 MG CAPS Take 1 capsule (10 mg total) by mouth daily. (Patient not taking: Reported on 09/24/2016) 30 each    No current facility-administered medications for this visit.     Review of Systems:  GENERAL:  Feels good.  No fevers or sweats.  Weight up 1 pound. PERFORMANCE STATUS (ECOG):  1 HEENT:  No visual changes, runny nose, sore throat, mouth sores or tenderness. Lungs: No shortness of breath or cough.  No hemoptysis. Cardiac:  No chest pain, palpitations, orthopnea, or PND.  Did not take blood pressure medication this morning. GI:  No nausea, vomiting,  diarrhea, constipation, melena or hematochezia. GU:  No urgency, frequency, dysuria, or hematuria. Musculoskeletal:  No back pain. Bursitis in hips treated with meloxicam.  No muscle tenderness. Extremities:  No pain or swelling. Skin:  No rashes or skin changes. Neuro:  No headache, numbness or weakness, balance or coordination issues. Endocrine:  No diabetes, thyroid issues, hot flashes or night sweats. Psych:  No mood changes, depression or anxiety. Pain:  No focal pain. Review of systems:  All other systems reviewed and found to be negative.  Physical Exam: Blood pressure (!) 147/91, pulse 63, temperature (!) 95 F (35 C), temperature source Tympanic, weight 149 lb 11.1 oz (67.9 kg). GENERAL:  Well developed, well nourished, woman sitting comfortably in the exam room in no acute distress. MENTAL STATUS:  Alert and oriented to person, place and time. HEAD:  Brown hair.  Normocephalic, atraumatic, face symmetric, no Cushingoid features. EYES:  No conjunctivitis or scleral icterus. NEUROLOGICAL: Unremarkable. PSYCH:  Appropriate.   No visits with results within 3 Day(s) from this visit.  Latest known visit with results is:  Appointment on 09/24/2016  Component Date Value Ref Range Status  . WBC 09/24/2016 6.1  3.6 - 11.0 K/uL Final  . RBC 09/24/2016 4.49  3.80 - 5.20 MIL/uL Final  . Hemoglobin 09/24/2016 13.2  12.0 - 16.0 g/dL Final  . HCT 09/24/2016 39.3  35.0 - 47.0 % Final  . MCV 09/24/2016 87.6  80.0 - 100.0 fL Final  . MCH 09/24/2016 29.5  26.0 - 34.0 pg Final  . MCHC 09/24/2016 33.7  32.0 - 36.0 g/dL Final  . RDW 09/24/2016 13.2  11.5 - 14.5 % Final  . Platelets 09/24/2016 215  150 - 440 K/uL Final  . Neutrophils Relative % 09/24/2016 51  % Final  . Neutro Abs 09/24/2016 3.1  1.4 - 6.5 K/uL Final  . Lymphocytes Relative 09/24/2016 34  % Final  . Lymphs Abs 09/24/2016 2.0  1.0 - 3.6 K/uL Final  . Monocytes Relative 09/24/2016 10  % Final  . Monocytes Absolute 09/24/2016  0.6  0.2 - 0.9 K/uL Final  . Eosinophils Relative 09/24/2016 4  % Final  . Eosinophils Absolute 09/24/2016 0.2  0 - 0.7 K/uL Final  . Basophils Relative 09/24/2016 1  % Final  . Basophils Absolute 09/24/2016 0.0  0 - 0.1 K/uL Final  . Sodium 09/24/2016 136  135 - 145 mmol/L Final  . Potassium 09/24/2016 3.7  3.5 - 5.1 mmol/L Final  . Chloride 09/24/2016 104  101 - 111 mmol/L Final  . CO2 09/24/2016 26  22 - 32 mmol/L Final  . Glucose, Bld 09/24/2016 96  65 - 99 mg/dL Final  . BUN 09/24/2016 17  6 - 20 mg/dL Final  . Creatinine, Ser 09/24/2016 0.77  0.44 - 1.00 mg/dL Final  . Calcium 09/24/2016 9.0  8.9 - 10.3 mg/dL Final  . Total Protein 09/24/2016 7.3  6.5 - 8.1 g/dL Final  . Albumin 09/24/2016 4.3  3.5 - 5.0 g/dL Final  . AST 09/24/2016 28  15 - 41 U/L Final  . ALT 09/24/2016 27  14 - 54 U/L Final  . Alkaline Phosphatase 09/24/2016 43  38 - 126 U/L Final  . Total Bilirubin 09/24/2016 0.6  0.3 - 1.2 mg/dL Final  . GFR calc non Af Amer 09/24/2016 >60  >60 mL/min Final  . GFR calc Af Amer 09/24/2016 >60  >60 mL/min Final   Comment: (NOTE) The eGFR has been calculated using the CKD EPI equation. This calculation has not been validated in all clinical situations. eGFR's persistently <60 mL/min signify possible Chronic Kidney Disease.   . Anion gap 09/24/2016 6  5 - 15 Final  . LDH 09/24/2016 128  98 - 192 U/L Final    Assessment:  Anita Buckley is a 66 y.o. female with chronic lymphocytic leukemia (CLL).  She was diagnosed in 2015 after presenting with a goiter.  She has been followed without intervention.  Ultrasound of the soft tissue head and neck on 05/15/2014 revealed homogeneous thyroid gland with several thyroid nodules. There were several enlarged left-sided neck lymph nodes. In the left lateral neck there was an atypical enlarged 3.34 cm lymph node.   Fine needle aspirate of a left neck node in 05/2014 revealed all lymphocytes consistent with chronic lymphocytic  leukemia/small lymphocytic lymphoma.  Flow cytometry revealed a clonal population of lymphocytes CD5+, CD23+, CD43 and CD+ positive. Thyroid nodule aspirate was negative for malignant cells.  PET scan on 06/06/2014 revealed an abnormal 1.1 cm mildly hypermetabolic level III left neck node.  There was activity in smaller station IB lymph nodes. There were numerous scattered small cervical lymph nodes bilaterally. There was faintly hypermetabolic bilateral axillary and subpectoral lymph nodes. Only one had borderline increased activity.  PET scan on 08/05/2015 revealed stable mild hypermetabolism within a 1.2 cm level III lymph node. There was hepatic steatosis and cholelithiasis. There was a tiny right renal stone.  Chest CT on 09/28/2016 revealed stable mild thoracic lymphadenopathy compatible with known CLL.  There was a 12 mm short axis left supraclavicular node, hypermetabolic on prior PET.  The bilateral axillary nodes were non FDG avid on prior PET scan.  She underwent left hemithyroidectomy on 08/20/2015.  Pathology revealed lymphocytic thyroiditis.  She is up-to-date on her mammogram.  Symptomatically, denies any B symptoms.  Exam reveals bilateral small axillary adenopathy.  Plan: 1.  Review labs and scans since last visit.  Labs are normal.  Scans reveal no change in adenopathy.  Discuss plan for ongoing observation without intervention. 2.  Discuss contacting the clinic if any interval concerns. 3.  RTC in 6 months for MD assessment and labs (CBC with diff, CMP, LDH, uric acid).   Lequita Asal, MD  09/29/2016, 9:10 AM

## 2016-09-29 NOTE — Progress Notes (Signed)
Patient here today for results.  Patient states she has not taken her BP meds this morning. BP slightly elevated today.

## 2016-10-20 ENCOUNTER — Encounter: Payer: Self-pay | Admitting: Hematology and Oncology

## 2016-11-07 ENCOUNTER — Encounter: Payer: Self-pay | Admitting: Family Medicine

## 2017-01-12 ENCOUNTER — Encounter: Payer: Self-pay | Admitting: Family Medicine

## 2017-01-12 MED ORDER — EPINEPHRINE 0.3 MG/0.3ML IJ SOAJ: 0.3000 mg | INTRAMUSCULAR | 5 refills | Status: DC | PRN

## 2017-02-21 ENCOUNTER — Encounter: Payer: Self-pay | Admitting: Family Medicine

## 2017-02-22 ENCOUNTER — Other Ambulatory Visit: Payer: Self-pay | Admitting: Family Medicine

## 2017-03-01 ENCOUNTER — Ambulatory Visit (INDEPENDENT_AMBULATORY_CARE_PROVIDER_SITE_OTHER): Payer: BLUE CROSS/BLUE SHIELD | Admitting: Family Medicine

## 2017-03-01 ENCOUNTER — Other Ambulatory Visit (HOSPITAL_COMMUNITY)
Admission: RE | Admit: 2017-03-01 | Discharge: 2017-03-01 | Disposition: A | Discharge status: Home / Self Care | Payer: BLUE CROSS/BLUE SHIELD | Source: Ambulatory Visit | Attending: Family Medicine | Admitting: Family Medicine

## 2017-03-01 ENCOUNTER — Encounter: Payer: Self-pay | Admitting: Family Medicine

## 2017-03-01 VITALS — BP 132/72 | HR 71 | Temp 97.7°F | Ht 65.5 in | Wt 144.8 lb

## 2017-03-01 DIAGNOSIS — E041 Nontoxic single thyroid nodule: Secondary | ICD-10-CM

## 2017-03-01 DIAGNOSIS — E049 Nontoxic goiter, unspecified: Secondary | ICD-10-CM | POA: Diagnosis not present

## 2017-03-01 DIAGNOSIS — I10 Essential (primary) hypertension: Secondary | ICD-10-CM | POA: Diagnosis not present

## 2017-03-01 DIAGNOSIS — Z01419 Encounter for gynecological examination (general) (routine) without abnormal findings: Secondary | ICD-10-CM | POA: Diagnosis present

## 2017-03-01 DIAGNOSIS — C9110 Chronic lymphocytic leukemia of B-cell type not having achieved remission: Secondary | ICD-10-CM

## 2017-03-01 DIAGNOSIS — Z0000 Encounter for general adult medical examination without abnormal findings: Secondary | ICD-10-CM

## 2017-03-01 DIAGNOSIS — E7800 Pure hypercholesterolemia, unspecified: Secondary | ICD-10-CM | POA: Diagnosis not present

## 2017-03-01 DIAGNOSIS — M8580 Other specified disorders of bone density and structure, unspecified site: Secondary | ICD-10-CM

## 2017-03-01 DIAGNOSIS — C8590 Non-Hodgkin lymphoma, unspecified, unspecified site: Secondary | ICD-10-CM | POA: Diagnosis not present

## 2017-03-01 DIAGNOSIS — R739 Hyperglycemia, unspecified: Secondary | ICD-10-CM

## 2017-03-01 LAB — HEMOGLOBIN A1C: HEMOGLOBIN A1C: 5.5 % (ref 4.6–6.5)

## 2017-03-01 LAB — CBC WITH DIFFERENTIAL/PLATELET
BASOS PCT: 0.6 % (ref 0.0–3.0)
Basophils Absolute: 0 10*3/uL (ref 0.0–0.1)
EOS PCT: 2.2 % (ref 0.0–5.0)
Eosinophils Absolute: 0.2 10*3/uL (ref 0.0–0.7)
HCT: 40.7 % (ref 36.0–46.0)
HEMOGLOBIN: 13.7 g/dL (ref 12.0–15.0)
LYMPHS PCT: 34.6 % (ref 12.0–46.0)
Lymphs Abs: 2.5 10*3/uL (ref 0.7–4.0)
MCHC: 33.7 g/dL (ref 30.0–36.0)
MCV: 88.7 fl (ref 78.0–100.0)
Monocytes Absolute: 0.7 10*3/uL (ref 0.1–1.0)
Monocytes Relative: 9.9 % (ref 3.0–12.0)
Neutro Abs: 3.9 10*3/uL (ref 1.4–7.7)
Neutrophils Relative %: 52.7 % (ref 43.0–77.0)
Platelets: 240 10*3/uL (ref 150.0–400.0)
RBC: 4.59 Mil/uL (ref 3.87–5.11)
RDW: 13.1 % (ref 11.5–15.5)
WBC: 7.3 10*3/uL (ref 4.0–10.5)

## 2017-03-01 LAB — COMPREHENSIVE METABOLIC PANEL
ALBUMIN: 4.6 g/dL (ref 3.5–5.2)
ALK PHOS: 41 U/L (ref 39–117)
ALT: 19 U/L (ref 0–35)
AST: 20 U/L (ref 0–37)
BUN: 16 mg/dL (ref 6–23)
CALCIUM: 9.2 mg/dL (ref 8.4–10.5)
CHLORIDE: 102 meq/L (ref 96–112)
CO2: 27 mEq/L (ref 19–32)
CREATININE: 0.66 mg/dL (ref 0.40–1.20)
GFR: 95.73 mL/min (ref >60.00)
Glucose, Bld: 101 mg/dL — ABNORMAL HIGH (ref 70–99)
Potassium: 3.7 mEq/L (ref 3.5–5.1)
SODIUM: 137 meq/L (ref 135–145)
TOTAL PROTEIN: 7.3 g/dL (ref 6.0–8.3)
Total Bilirubin: 0.6 mg/dL (ref 0.2–1.2)

## 2017-03-01 LAB — LIPID PANEL
CHOLESTEROL: 207 mg/dL — AB (ref 0–200)
HDL: 66.2 mg/dL (ref >39.00)
LDL CALC: 127 mg/dL — AB (ref 0–99)
NonHDL: 141.23
Total CHOL/HDL Ratio: 3
Triglycerides: 70 mg/dL (ref 0.0–149.0)
VLDL: 14 mg/dL (ref 0.0–40.0)

## 2017-03-01 LAB — TSH: TSH: 4.28 u[IU]/mL (ref 0.35–4.50)

## 2017-03-01 MED ORDER — ALPRAZOLAM 0.5 MG PO TABS: 0.5000 mg | ORAL_TABLET | Freq: Every day | ORAL | 3 refills | Status: DC | PRN

## 2017-03-01 MED ORDER — SCOPOLAMINE 1 MG/3DAYS TD PT72: 1.0000 | MEDICATED_PATCH | TRANSDERMAL | 1 refills | Status: DC

## 2017-03-01 MED ORDER — AMLODIPINE BESYLATE 5 MG PO TABS: 5.0000 mg | ORAL_TABLET | Freq: Every day | ORAL | 3 refills | Status: DC

## 2017-03-01 MED ORDER — ALENDRONATE SODIUM 70 MG PO TABS: ORAL_TABLET | ORAL | 11 refills | Status: DC

## 2017-03-01 NOTE — Progress Notes (Signed)
Subjective:    Patient ID: Anita Buckley, female    DOB: 1951/02/25, 66 y.o.   MRN: [id number]  HPI Here for health maintenance exam and to review chronic medical problems    She has had a rough few weeks - caring for husb with parkinsons  Brother with bladder ca (she has to help care for her mother when he is sick)   Wt Readings from Last 3 Encounters:  03/01/17 144 lb 12 oz (65.7 kg)  09/29/16 149 lb 11.1 oz (67.9 kg)  09/24/16 148 lb 13 oz (67.5 kg)  23.72 kg/m  Down 5 lb- eating well/ mindful of what she is eating  Exercise is on and off - goes to an enhanced fitness class and some walking / yoga at home  When she gets time   dexa 11/16 Worse osteopenia  On fosamax weekly - no problems  Not ready to schedule dexa  No falls or fractures   Pap 5/13 nl  We will do that today  No gyn symptoms    Mammogram 10/17 nl Self breast exam - no lumps   Colonoscopy 9/17 nl with 5 y recall   Tdap 5/15  zostavax 10/08 Interested in the Shingrix later when available   Hx of CLL- monitoring for this and lymphoma have slowed down  Doing well Lab Results  Component Value Date   WBC 6.1 09/24/2016   HGB 13.2 09/24/2016   HCT 39.3 09/24/2016   MCV 87.6 09/24/2016   PLT 215 09/24/2016     Hx of hemithyroidectomy 1/17 No symptoms or growth  Oncologist watches it  Lab Results  Component Value Date   TSH 3.76 02/07/2016     bp is stable today  No cp or palpitations or headaches or edema  No side effects to medicines  BP Readings from Last 3 Encounters:  03/01/17 132/72  09/29/16 (!) 146/98  09/24/16 131/87      Hyperlipidemia Lab Results  Component Value Date   CHOL 203 (H) 02/07/2016   CHOL 199 01/22/2015   CHOL 196 01/04/2014   Lab Results  Component Value Date   HDL 69.50 02/07/2016   HDL 68.40 01/22/2015   HDL 67.50 01/04/2014   Lab Results  Component Value Date   LDLCALC 118 (H) 02/07/2016   LDLCALC 117 (H) 01/22/2015   LDLCALC 114 (H)  01/04/2014   Lab Results  Component Value Date   TRIG 78.0 02/07/2016   TRIG 70.0 01/22/2015   TRIG 71.0 01/04/2014   Lab Results  Component Value Date   CHOLHDL 3 02/07/2016   CHOLHDL 3 01/22/2015   CHOLHDL 3 01/04/2014   Lab Results  Component Value Date   LDLDIRECT 126.4 06/28/2012   LDLDIRECT 143.2 08/15/2008   LDLDIRECT 141.2 05/10/2008   Hx of hyperglycemia Lab Results  Component Value Date   HGBA1C 5.6 02/07/2016  due for that   Will get labs today   Patient Active Problem List   Diagnosis Date Noted  . Encounter for annual routine gynecological examination 03/01/2017  . Chronic lymphocytic leukemia (CLL), B-cell ([place]) 02/11/2016  . Colon cancer screening 01/25/2015  . Estrogen deficiency 01/25/2015  . Lymphoma ([place]) 06/04/2014  . Osteopenia 01/06/2013  . HTN (hypertension), benign 06/28/2012  . Other screening mammogram 01/05/2012  . Gynecological examination 01/05/2012  . Post-menopause 01/05/2012  . Routine general medical examination at a health care facility 12/31/2011  . PURE HYPERCHOLESTEROLEMIA 08/15/2008  . ANXIETY 12/08/2007  . DEPRESSION 03/30/2007  . FIBROCYSTIC  BREAST DISEASE 03/30/2007  . Hyperglycemia 03/30/2007   Past Medical History:  Diagnosis Date  . Anxiety   . Cancer ([place])    B CELL LYMPHOMA  . Chronic lymphocytic leukemia (CLL), B-cell ([place]) 02/11/2016  . Depression   . History of anxiety   . History of depression   . History of fibrocystic disease of breast   . History of hyperlipidemia   . Hypertension   . Situational stress    Past Surgical History:  Procedure Laterality Date  . COLONOSCOPY  12/2005   polyps, tics  . COLONOSCOPY WITH PROPOFOL N/A 05/11/2016   Procedure: COLONOSCOPY WITH PROPOFOL;  Surgeon: Manya Silvas, MD;  Location: [facility] ENDOSCOPY;  Service: Endoscopy;  Laterality: N/A;  . THYROIDECTOMY Left 08/20/2015   Procedure: THYROIDECTOMY/ HEMITHYROIDECTOMY;  Surgeon: Beverly Gust, MD;  Location: [facility] ORS;   Service: ENT;  Laterality: Left;  . TUBAL LIGATION     Social History  Substance Use Topics  . Smoking status: Never Smoker  . Smokeless tobacco: Never Used  . Alcohol use 0.0 oz/week     Comment: occ (wine with dinner)   Family History  Problem Relation Age of Onset  . Diabetes Father        late in life  . Alcohol abuse Father   . Hyperlipidemia Mother   . Mental illness Mother   . Depression Sister   . Leukemia Brother   . Bladder Cancer Brother   . Schizophrenia Other        Grandfather   Allergies  Allergen Reactions  . Bee Venom     Anaphylaxis   . Buspirone Hcl     REACTION: did not like  . Minocycline Nausea And Vomiting  . Penicillins     REACTION: reaction as a child   Current Outpatient Prescriptions on File Prior to Visit  Medication Sig Dispense Refill  . EPINEPHrine (EPIPEN 2-PAK) 0.3 mg/0.3 mL IJ SOAJ injection Inject 0.3 mLs (0.3 mg total) into the muscle as needed. 1 Device 5  . famotidine (PEPCID) 10 MG tablet Take 1 tablet (10 mg total) by mouth daily.    . Loratadine 10 MG CAPS Take 1 capsule (10 mg total) by mouth daily. 30 each    No current facility-administered medications on file prior to visit.     Review of Systems    Review of Systems  Constitutional: Negative for fever, appetite change, fatigue and unexpected weight change.  Eyes: Negative for pain and visual disturbance.  Respiratory: Negative for cough and shortness of breath.   Cardiovascular: Negative for cp or palpitations    Gastrointestinal: Negative for nausea, diarrhea and constipation.  Genitourinary: Negative for urgency and frequency.  Skin: Negative for pallor or rash   MSK pos for hip discomfort on and off  Neurological: Negative for weakness, light-headedness, numbness and headaches.  Hematological: Negative for adenopathy. Does not bruise/bleed easily.  Psychiatric/Behavioral: Negative for dysphoric mood. The patient is not nervous/anxious.      Objective:    Physical Exam  Constitutional: She appears well-developed and well-nourished. No distress.  Well appearing   HENT:  Head: Normocephalic and atraumatic.  Right Ear: External ear normal.  Left Ear: External ear normal.  Mouth/Throat: Oropharynx is clear and moist.  Eyes: Pupils are equal, round, and reactive to light. Conjunctivae and EOM are normal. No scleral icterus.  Neck: Normal range of motion. Neck supple. No JVD present. Carotid bruit is not present. No thyromegaly present.  Cardiovascular: Normal rate, regular rhythm,  normal heart sounds and intact distal pulses.  Exam reveals no gallop.   Pulmonary/Chest: Effort normal and breath sounds normal. No respiratory distress. She has no wheezes. She exhibits no tenderness.  Abdominal: Soft. Bowel sounds are normal. She exhibits no distension, no abdominal bruit and no mass. There is no tenderness.  Genitourinary: No breast swelling, tenderness, discharge or bleeding.  Genitourinary Comments: Breast exam: No mass, nodules, thickening, tenderness, bulging, retraction, inflamation, nipple discharge or skin changes noted.  No axillary or clavicular LA.             Anus appears normal w/o hemorrhoids or masses     External genitalia : nl appearance and hair distribution/no lesions     Urethral meatus : nl size, no lesions or prolapse     Urethra: no masses, tenderness or scarring    Bladder : no masses or tenderness     Vagina: nl general appearance, no discharge or  Lesions, no significant cystocele  or rectocele     Cervix: no lesions/ discharge or friability    Uterus: nl size, contour, position, and mobility (not fixed) , non tender    Adnexa : no masses, tenderness, enlargement or nodularity        Musculoskeletal: Normal range of motion. She exhibits no edema or tenderness.  Lymphadenopathy:    She has no cervical adenopathy.  Neurological: She is alert. She has normal reflexes. No cranial nerve deficit. She  exhibits normal muscle tone. Coordination normal.  Skin: Skin is warm and dry. No rash noted. No erythema. No pallor.  Solar lentigines diffusely   Psychiatric: She has a normal mood and affect.          Assessment & Plan:   Problem List Items Addressed This Visit      Cardiovascular and Mediastinum   HTN (hypertension), benign - Primary    bp in fair control at this time  BP Readings from Last 1 Encounters:  03/01/17 132/72   No changes needed Disc lifstyle change with low sodium diet and exercise  Taking amlodipine  Lab today      Relevant Medications   amLODipine (NORVASC) 5 MG tablet     Endocrine   RESOLVED: Goiter   RESOLVED: Left thyroid nodule    Resolved after hemithyroidectomy        Musculoskeletal and Integument   Osteopenia     Other   Chronic lymphocytic leukemia (CLL), B-cell ([facility])    Ongoing  Followed by heme-onc and stable        Relevant Medications   ALPRAZolam (XANAX) 0.5 MG tablet   scopolamine (TRANSDERM-SCOP, 1.5 MG,) 1 MG/3DAYS   Encounter for annual routine gynecological examination    Routine nl exam wit pap done No c/o      Relevant Orders   Cytology - PAP   Hyperglycemia    A1C today  Watching sugar in diet Good habits       Relevant Orders   Hemoglobin A1c (Completed)   Lymphoma (H[facility])    b cell lymphoma with CLL Continues heme-onc care       Relevant Medications   ALPRAZolam (XANAX) 0.5 MG tablet   scopolamine (TRANSDERM-SCOP, 1.5 MG,) 1 MG/3DAYS   PURE HYPERCHOLESTEROLEMIA    Disc goals for lipids and reasons to control them Rev labs with pt (last check)  Rev low sat fat diet in detail       Relevant Medications   amLODipine (NORVASC) 5 MG tablet   Routine general medical  examination at a health care facility    Reviewed health habits including diet and exercise and skin cancer prevention Reviewed appropriate screening tests for age  Also reviewed health mt list, fam hx and immunization status , as well  as social and family history   See HPI Labs drawn  Px for scop patch for upcoming boat trip       Relevant Orders   CBC with Differential/Platelet (Completed)   Comprehensive metabolic panel (Completed)   Lipid panel (Completed)   TSH (Completed)

## 2017-03-01 NOTE — Assessment & Plan Note (Signed)
Resolved after hemithyroidectomy

## 2017-03-01 NOTE — Assessment & Plan Note (Signed)
Reviewed health habits including diet and exercise and skin cancer prevention Reviewed appropriate screening tests for age  Also reviewed health mt list, fam hx and immunization status , as well as social and family history   See HPI Labs drawn  Px for scop patch for upcoming boat trip

## 2017-03-01 NOTE — Assessment & Plan Note (Signed)
b cell lymphoma with CLL Continues heme-onc care

## 2017-03-01 NOTE — Assessment & Plan Note (Signed)
bp in fair control at this time  BP Readings from Last 1 Encounters:  03/01/17 132/72   No changes needed Disc lifstyle change with low sodium diet and exercise  Taking amlodipine  Lab today

## 2017-03-01 NOTE — Assessment & Plan Note (Signed)
Routine nl exam wit pap done No c/o

## 2017-03-01 NOTE — Assessment & Plan Note (Signed)
Ongoing  Followed by heme-onc and stable

## 2017-03-01 NOTE — Assessment & Plan Note (Signed)
Disc goals for lipids and reasons to control them Rev labs with pt (last check) Rev low sat fat diet in detail  

## 2017-03-01 NOTE — Patient Instructions (Signed)
Take care of yourself  Use the patch for sea sickness for the boat Labs today

## 2017-03-01 NOTE — Assessment & Plan Note (Signed)
A1C today  Watching sugar in diet Good habits

## 2017-03-03 LAB — CYTOLOGY - PAP
DIAGNOSIS: NEGATIVE
HPV: NOT DETECTED

## 2017-03-29 ENCOUNTER — Other Ambulatory Visit: Payer: BLUE CROSS/BLUE SHIELD

## 2017-03-29 ENCOUNTER — Ambulatory Visit: Payer: BLUE CROSS/BLUE SHIELD | Admitting: Hematology and Oncology

## 2017-06-13 NOTE — Progress Notes (Signed)
[facility] day:  06/14/2017  Chief Complaint: Anita Buckley is a 66 y.o. female with chronic lymphocytic leukemia who is seen for 8 month assessment.  HPI:  The patient was last seen in the medical oncology clinic on 09/29/2016.  At that time, she denies any B symptoms.  Exam revealed bilateral small axillary adenopathy.  CBC was normal.  During the interim, patient has been doing "great". Patient denies physical complaints. Patient denies any bruising or bleeding. She has not had any B symptoms or interval infections. Patient received her annual influenza vaccination about 3 weeks ago. Patient eating well, with no demonstrated weight loss.   Past Medical History:  Diagnosis Date  . Anxiety   . Cancer ([place])    B CELL LYMPHOMA  . Chronic lymphocytic leukemia (CLL), B-cell ([place]) 02/11/2016  . Depression   . History of anxiety   . History of depression   . History of fibrocystic disease of breast   . History of hyperlipidemia   . Hypertension   . Situational stress     Past Surgical History:  Procedure Laterality Date  . COLONOSCOPY  12/2005   polyps, tics  . COLONOSCOPY WITH PROPOFOL N/A 05/11/2016   Procedure: COLONOSCOPY WITH PROPOFOL;  Surgeon: Manya Silvas, MD;  [facility] ENDOSCOPY;  Service: Endoscopy;  Laterality: N/A;  . THYROIDECTOMY Left 08/20/2015   Procedure: THYROIDECTOMY/ HEMITHYROIDECTOMY;  Surgeon: Beverly Gust, MD;  Location: [facility] ORS;  Service: ENT;  Laterality: Left;  . TUBAL LIGATION      Family History  Problem Relation Age of Onset  . Diabetes Father        late in life  . Alcohol abuse Father   . Hyperlipidemia Mother   . Mental illness Mother   . Depression Sister   . Leukemia Brother   . Bladder Cancer Brother   . Schizophrenia Other        Grandfather    Social History:  reports that she has never smoked. She has never used smokeless tobacco. She reports that she drinks alcohol. She  reports that she does not use drugs.  She lives in [place].  Her husband is a Paediatric nurse.  She is accompanied by her husband today.  Allergies:  Allergies  Allergen Reactions  . Bee Venom     Anaphylaxis   . Buspirone Hcl     REACTION: did not like  . Minocycline Nausea And Vomiting  . Penicillins     REACTION: reaction as a child    Current Medications: Current Outpatient Prescriptions  Medication Sig Dispense Refill  . alendronate (FOSAMAX) 70 MG tablet TAKE ONE TABLET BY MOUTH ONCE A WEEK WITH A FULL GLASS OF WATER ON AN EMPTY STOMACH 4 tablet 11  . amLODipine (NORVASC) 5 MG tablet Take 1 tablet (5 mg total) by mouth daily. 90 tablet 3  . Calcium Citrate-Vitamin D (CITRACAL + D PO) Take 1-2 capsules by mouth daily.     . famotidine (PEPCID) 10 MG tablet Take 1 tablet (10 mg total) by mouth daily.    Marland Kitchen latanoprost (XALATAN) 0.005 % ophthalmic solution Place 1 drop into both eyes at bedtime.    . Loratadine 10 MG CAPS Take 1 capsule (10 mg total) by mouth daily. 30 each   . ALPRAZolam (XANAX) 0.5 MG tablet Take 1 tablet (0.5 mg total) by mouth daily as needed for anxiety. (Patient not taking: Reported on 06/14/2017) 30 tablet 3  . EPINEPHrine (EPIPEN  2-PAK) 0.3 mg/0.3 mL IJ SOAJ injection Inject 0.3 mLs (0.3 mg total) into the muscle as needed. (Patient not taking: Reported on 06/14/2017) 1 Device 5   No current facility-administered medications for this visit.     Review of Systems:  GENERAL:  Feels good.  No fevers or sweats.  Weight up 5 pounds.  PERFORMANCE STATUS (ECOG):  1 HEENT:  No visual changes, runny nose, sore throat, mouth sores or tenderness. Lungs: No shortness of breath or cough.  No hemoptysis. Cardiac:  No chest pain, palpitations, orthopnea, or PND.  Did not take blood pressure medication this morning. GI:  No nausea, vomiting, diarrhea, constipation, melena or hematochezia. GU:  No urgency, frequency, dysuria, or hematuria. Musculoskeletal:  No back pain.  Bursitis in hips treated with meloxicam.  No muscle tenderness. Extremities:  No pain or swelling. Skin:  No rashes or skin changes. Neuro:  No headache, numbness or weakness, balance or coordination issues. Endocrine:  No diabetes, thyroid issues, hot flashes or night sweats. Psych:  No mood changes, depression or anxiety. Pain:  No focal pain. Review of systems:  All other systems reviewed and found to be negative.  Physical Exam: Blood pressure 125/82, pulse 64, temperature (!) 97.2 F (36.2 C), temperature source Tympanic, resp. rate 18, weight 149 lb (67.6 kg). GENERAL:  Well developed, well nourished, woman sitting comfortably in the exam room in no acute distress. MENTAL STATUS:  Alert and oriented to person, place and time. HEAD:  Brown hair.  Normocephalic, atraumatic, face symmetric, no Cushingoid features. EYES:  Pupils equal round and reactive to light and accomodation.  No conjunctivitis or scleral icterus. ENT:  Oropharynx clear without lesion.  Tongue normal. Mucous membranes moist.  RESPIRATORY:  Clear to auscultation without rales, wheezes or rhonchi. CARDIOVASCULAR:  Regular rate and rhythm without murmur, rub or gallop. ABDOMEN:  Soft, non-tender, with active bowel sounds, and no hepatosplenomegaly.  No masses. SKIN:  No rashes, ulcers or lesions. EXTREMITIES: No edema, no skin discoloration or tenderness.  No palpable cords. LYMPH NODES:  1.5-2 cm left axillary adenopathy.  No palpable cervical, supraclavicular, or inguinal adenopathy  NEUROLOGICAL: Unremarkable. PSYCH:  Appropriate.   Appointment on 06/14/2017  Component Date Value Ref Range Status  . WBC 06/14/2017 7.3  3.6 - 11.0 K/uL Final  . RBC 06/14/2017 4.46  3.80 - 5.20 MIL/uL Final  . Hemoglobin 06/14/2017 13.3  12.0 - 16.0 g/dL Final  . HCT 06/14/2017 39.4  35.0 - 47.0 % Final  . MCV 06/14/2017 88.3  80.0 - 100.0 fL Final  . MCH 06/14/2017 29.8  26.0 - 34.0 pg Final  . MCHC 06/14/2017 33.7  32.0 -  36.0 g/dL Final  . RDW 06/14/2017 13.1  11.5 - 14.5 % Final  . Platelets 06/14/2017 220  150 - 440 K/uL Final  . Neutrophils Relative % 06/14/2017 56  % Final  . Neutro Abs 06/14/2017 4.1  1.4 - 6.5 K/uL Final  . Lymphocytes Relative 06/14/2017 31  % Final  . Lymphs Abs 06/14/2017 2.2  1.0 - 3.6 K/uL Final  . Monocytes Relative 06/14/2017 10  % Final  . Monocytes Absolute 06/14/2017 0.7  0.2 - 0.9 K/uL Final  . Eosinophils Relative 06/14/2017 2  % Final  . Eosinophils Absolute 06/14/2017 0.2  0 - 0.7 K/uL Final  . Basophils Relative 06/14/2017 1  % Final  . Basophils Absolute 06/14/2017 0.1  0 - 0.1 K/uL Final  . Sodium 06/14/2017 138  135 - 145 mmol/L Final  .  Potassium 06/14/2017 4.0  3.5 - 5.1 mmol/L Final  . Chloride 06/14/2017 105  101 - 111 mmol/L Final  . CO2 06/14/2017 25  22 - 32 mmol/L Final  . Glucose, Bld 06/14/2017 97  65 - 99 mg/dL Final  . BUN 06/14/2017 19  6 - 20 mg/dL Final  . Creatinine, Ser 06/14/2017 0.69  0.44 - 1.00 mg/dL Final  . Calcium 06/14/2017 9.2  8.9 - 10.3 mg/dL Final  . Total Protein 06/14/2017 7.1  6.5 - 8.1 g/dL Final  . Albumin 06/14/2017 4.4  3.5 - 5.0 g/dL Final  . AST 06/14/2017 31  15 - 41 U/L Final  . ALT 06/14/2017 36  14 - 54 U/L Final  . Alkaline Phosphatase 06/14/2017 49  38 - 126 U/L Final  . Total Bilirubin 06/14/2017 0.8  0.3 - 1.2 mg/dL Final  . GFR calc non Af Amer 06/14/2017 >60  >60 mL/min Final  . GFR calc Af Amer 06/14/2017 >60  >60 mL/min Final   Comment: (NOTE) The eGFR has been calculated using the CKD EPI equation. This calculation has not been validated in all clinical situations. eGFR's persistently <60 mL/min signify possible Chronic Kidney Disease.   . Anion gap 06/14/2017 8  5 - 15 Final  . LDH 06/14/2017 130  98 - 192 U/L Final    Assessment:  Anita Buckley is a 66 y.o. female with chronic lymphocytic leukemia (CLL).  She was diagnosed in 2015 after presenting with a goiter.  She has been followed without  intervention.  Ultrasound of the soft tissue head and neck on 05/15/2014 revealed homogeneous thyroid gland with several thyroid nodules. There were several enlarged left-sided neck lymph nodes. In the left lateral neck there was an atypical enlarged 3.34 cm lymph node.   Fine needle aspirate of a left neck node in 05/2014 revealed all lymphocytes consistent with chronic lymphocytic leukemia/small lymphocytic lymphoma.  Flow cytometry revealed a clonal population of lymphocytes CD5+, CD23+, CD43 and CD+ positive. Thyroid nodule aspirate was negative for malignant cells.  PET scan on 06/06/2014 revealed an abnormal 1.1 cm mildly hypermetabolic level III left neck node.  There was activity in smaller station IB lymph nodes. There were numerous scattered small cervical lymph nodes bilaterally. There was faintly hypermetabolic bilateral axillary and subpectoral lymph nodes. Only one had borderline increased activity.  PET scan on 08/05/2015 revealed stable mild hypermetabolism within a 1.2 cm level III lymph node. There was hepatic steatosis and cholelithiasis. There was a tiny right renal stone.  Chest CT on 09/28/2016 revealed stable mild thoracic lymphadenopathy compatible with known CLL.  There was a 12 mm short axis left supraclavicular node, hypermetabolic on prior PET.  The bilateral axillary nodes were non FDG avid on prior PET scan.  She underwent left hemithyroidectomy on 08/20/2015.  Pathology revealed lymphocytic thyroiditis.  She is up-to-date on her mammogram.  Symptomatically, she denies any B symptoms.  Exam reveals small left axillary adenopathy. Labs are unremarkable.   Plan: 1.  Labs today:  CBC with diff, CMP, LDH, uric acid. 2.  Due for annual mammogram. Patient to discuss with PCP.  3.  RTC in 6 months for MD assessment and labs (CBC with diff, CMP, LDH, uric acid).   Honor Loh, NP  06/14/2017, 11:19 AM   I saw and evaluated the patient, participating in the key  portions of the service and reviewing pertinent diagnostic studies and records.  I reviewed the nurse practitioner's note and agree with the findings  and the plan.  The assessment and plan were discussed with the patient.  A few questions were asked by the patient and answered.   Nolon Stalls, MD 06/14/2017, 11:19 AM

## 2017-06-14 ENCOUNTER — Other Ambulatory Visit: Payer: Self-pay | Admitting: *Deleted

## 2017-06-14 ENCOUNTER — Encounter: Payer: Self-pay | Admitting: Hematology and Oncology

## 2017-06-14 ENCOUNTER — Inpatient Hospital Stay (HOSPITAL_BASED_OUTPATIENT_CLINIC_OR_DEPARTMENT_OTHER): Payer: BLUE CROSS/BLUE SHIELD | Admitting: Hematology and Oncology

## 2017-06-14 ENCOUNTER — Inpatient Hospital Stay: Payer: BLUE CROSS/BLUE SHIELD | Attending: Hematology and Oncology

## 2017-06-14 ENCOUNTER — Other Ambulatory Visit: Payer: Self-pay | Admitting: Family Medicine

## 2017-06-14 VITALS — BP 125/82 | HR 64 | Temp 97.2°F | Resp 18 | Wt 149.0 lb

## 2017-06-14 DIAGNOSIS — K760 Fatty (change of) liver, not elsewhere classified: Secondary | ICD-10-CM | POA: Diagnosis not present

## 2017-06-14 DIAGNOSIS — R509 Fever, unspecified: Secondary | ICD-10-CM | POA: Diagnosis not present

## 2017-06-14 DIAGNOSIS — C9110 Chronic lymphocytic leukemia of B-cell type not having achieved remission: Secondary | ICD-10-CM | POA: Insufficient documentation

## 2017-06-14 DIAGNOSIS — F329 Major depressive disorder, single episode, unspecified: Secondary | ICD-10-CM | POA: Diagnosis not present

## 2017-06-14 DIAGNOSIS — E042 Nontoxic multinodular goiter: Secondary | ICD-10-CM

## 2017-06-14 DIAGNOSIS — K8020 Calculus of gallbladder without cholecystitis without obstruction: Secondary | ICD-10-CM | POA: Diagnosis not present

## 2017-06-14 DIAGNOSIS — E785 Hyperlipidemia, unspecified: Secondary | ICD-10-CM | POA: Insufficient documentation

## 2017-06-14 DIAGNOSIS — Z8052 Family history of malignant neoplasm of bladder: Secondary | ICD-10-CM

## 2017-06-14 DIAGNOSIS — Z79899 Other long term (current) drug therapy: Secondary | ICD-10-CM | POA: Insufficient documentation

## 2017-06-14 DIAGNOSIS — I10 Essential (primary) hypertension: Secondary | ICD-10-CM

## 2017-06-14 DIAGNOSIS — Z806 Family history of leukemia: Secondary | ICD-10-CM | POA: Insufficient documentation

## 2017-06-14 DIAGNOSIS — Z1231 Encounter for screening mammogram for malignant neoplasm of breast: Secondary | ICD-10-CM

## 2017-06-14 DIAGNOSIS — F419 Anxiety disorder, unspecified: Secondary | ICD-10-CM | POA: Diagnosis not present

## 2017-06-14 LAB — CBC WITH DIFFERENTIAL/PLATELET
Basophils Absolute: 0.1 10*3/uL (ref 0–0.1)
Basophils Relative: 1 %
Eosinophils Absolute: 0.2 10*3/uL (ref 0–0.7)
Eosinophils Relative: 2 %
HCT: 39.4 % (ref 35.0–47.0)
Hemoglobin: 13.3 g/dL (ref 12.0–16.0)
Lymphocytes Relative: 31 %
Lymphs Abs: 2.2 10*3/uL (ref 1.0–3.6)
MCH: 29.8 pg (ref 26.0–34.0)
MCHC: 33.7 g/dL (ref 32.0–36.0)
MCV: 88.3 fL (ref 80.0–100.0)
Monocytes Absolute: 0.7 10*3/uL (ref 0.2–0.9)
Monocytes Relative: 10 %
Neutro Abs: 4.1 10*3/uL (ref 1.4–6.5)
Neutrophils Relative %: 56 %
Platelets: 220 10*3/uL (ref 150–440)
RBC: 4.46 MIL/uL (ref 3.80–5.20)
RDW: 13.1 % (ref 11.5–14.5)
WBC: 7.3 10*3/uL (ref 3.6–11.0)

## 2017-06-14 LAB — COMPREHENSIVE METABOLIC PANEL
ALT: 36 U/L (ref 14–54)
AST: 31 U/L (ref 15–41)
Albumin: 4.4 g/dL (ref 3.5–5.0)
Alkaline Phosphatase: 49 U/L (ref 38–126)
Anion gap: 8 (ref 5–15)
BUN: 19 mg/dL (ref 6–20)
CO2: 25 mmol/L (ref 22–32)
Calcium: 9.2 mg/dL (ref 8.9–10.3)
Chloride: 105 mmol/L (ref 101–111)
Creatinine, Ser: 0.69 mg/dL (ref 0.44–1.00)
GFR calc Af Amer: >60 mL/min (ref >60)
GFR calc non Af Amer: >60 mL/min (ref >60)
Glucose, Bld: 97 mg/dL (ref 65–99)
Potassium: 4 mmol/L (ref 3.5–5.1)
Sodium: 138 mmol/L (ref 135–145)
Total Bilirubin: 0.8 mg/dL (ref 0.3–1.2)
Total Protein: 7.1 g/dL (ref 6.5–8.1)

## 2017-06-14 LAB — URIC ACID: Uric Acid, Serum: 5 mg/dL (ref 2.3–6.6)

## 2017-06-14 LAB — LACTATE DEHYDROGENASE: LDH: 130 U/L (ref 98–192)

## 2017-06-14 NOTE — Progress Notes (Signed)
Here for follow up. Doing well she stated  

## 2017-07-04 DIAGNOSIS — H409 Unspecified glaucoma: Secondary | ICD-10-CM | POA: Insufficient documentation

## 2017-07-23 ENCOUNTER — Ambulatory Visit
Admission: RE | Admit: 2017-07-23 | Discharge: 2017-07-23 | Disposition: A | Discharge status: Home / Self Care | Payer: BLUE CROSS/BLUE SHIELD | Source: Ambulatory Visit | Attending: Family Medicine | Admitting: Family Medicine

## 2017-07-23 DIAGNOSIS — Z1231 Encounter for screening mammogram for malignant neoplasm of breast: Secondary | ICD-10-CM | POA: Insufficient documentation

## 2017-08-23 ENCOUNTER — Other Ambulatory Visit: Payer: Self-pay | Admitting: Family Medicine

## 2017-12-12 NOTE — Progress Notes (Signed)
[facility] day:  12/13/2017  Chief Complaint: Anita Buckley is a 67 y.o. female with chronic lymphocytic leukemia who is seen for 6 month assessment.  HPI:  The patient was last seen in the medical oncology clinic on 06/14/2017.  At that time, patient was doing "great". She denies any complaints. Exam revealed small LEFT axillary adenopathy. Labs were unremarkable. She was due for her mammogram; ordered by PCP.   Mammogram on 07/23/2017 that demonstrated no mammographic evidence of malignancy.   During the interim, she notes hip and back pain which she attributes to lack of exercise and flat feet.  She is doing yoga.  She denies any adenopathy, bruising, bleeding or infections.   Past Medical History:  Diagnosis Date  . Anxiety   . Cancer ([place])    B CELL LYMPHOMA  . Chronic lymphocytic leukemia (CLL), B-cell ([place]) 02/11/2016  . Depression   . History of anxiety   . History of depression   . History of fibrocystic disease of breast   . History of hyperlipidemia   . Hypertension   . Situational stress     Past Surgical History:  Procedure Laterality Date  . COLONOSCOPY  12/2005   polyps, tics  . COLONOSCOPY WITH PROPOFOL N/A 05/11/2016   Procedure: COLONOSCOPY WITH PROPOFOL;  Surgeon: Manya Silvas, MD;  Location: [facility] ENDOSCOPY;  Service: Endoscopy;  Laterality: N/A;  . THYROIDECTOMY Left 08/20/2015   Procedure: THYROIDECTOMY/ HEMITHYROIDECTOMY;  Surgeon: Beverly Gust, MD;  Location: [facility] ORS;  Service: ENT;  Laterality: Left;  . TUBAL LIGATION      Family History  Problem Relation Age of Onset  . Diabetes Father        late in life  . Alcohol abuse Father   . Hyperlipidemia Mother   . Mental illness Mother   . Depression Sister   . Leukemia Brother   . Bladder Cancer Brother   . Schizophrenia Other        Grandfather  . Breast cancer Neg Hx     Social History:  reports that she has never smoked. She has never  used smokeless tobacco. She reports that she drinks alcohol. She reports that she does not use drugs.  She lives in [place].  Her husband is a Paediatric nurse.  She is accompanied by her husband today.  Allergies:  Allergies  Allergen Reactions  . Bee Venom     Anaphylaxis   . Buspirone Hcl     REACTION: did not like  . Minocycline Nausea And Vomiting  . Penicillins     REACTION: reaction as a child    Current Medications: Current Outpatient Medications  Medication Sig Dispense Refill  . alendronate (FOSAMAX) 70 MG tablet TAKE ONE TABLET BY MOUTH ONCE A WEEK WITH A FULL GLASS OF WATER ON AN EMPTY STOMACH 4 tablet 11  . amLODipine (NORVASC) 5 MG tablet Take 1 tablet (5 mg total) by mouth daily. 90 tablet 3  . Calcium Citrate-Vitamin D (CITRACAL + D PO) Take 1-2 capsules by mouth daily.     Marland Kitchen latanoprost (XALATAN) 0.005 % ophthalmic solution Place 1 drop into both eyes at bedtime.    . Loratadine 10 MG CAPS Take 1 capsule (10 mg total) by mouth daily. 30 each   . ALPRAZolam (XANAX) 0.5 MG tablet Take 1 tablet (0.5 mg total) by mouth daily as needed for anxiety. (Patient not taking: Reported on 06/14/2017) 30 tablet 3  . EPINEPHRINE 0.3 mg/0.3  mL IJ SOAJ injection INJECT 0.3 ML INTO MUSCLE AS NEEDED 2 Device 2  . famotidine (PEPCID) 10 MG tablet Take 1 tablet (10 mg total) by mouth daily. (Patient not taking: Reported on 12/13/2017)     No current facility-administered medications for this visit.     Review of Systems:  GENERAL:  Feels "ok".  No fevers, sweats.  Weight down 1 pound. PERFORMANCE STATUS (ECOG):  1 HEENT:  No visual changes, runny nose, sore throat, mouth sores or tenderness. Lungs: No shortness of breath or cough.  No hemoptysis. Cardiac:  No chest pain, palpitations, orthopnea, or PND. GI:  No nausea, vomiting, diarrhea, constipation, melena or hematochezia. GU:  No urgency, frequency, dysuria, or hematuria. Musculoskeletal:  Hip and back pain.  Known bursitis in hips.   No muscle tenderness. Extremities:  No pain or swelling. Skin:  No rashes or skin changes. Neuro:  No headache, numbness or weakness, balance or coordination issues. Endocrine:  No diabetes, thyroid issues, hot flashes or night sweats. Psych:  No mood changes, depression or anxiety. Pain:  No focal pain. Review of systems:  All other systems reviewed and found to be negative.   Physical Exam: Blood pressure (!) 148/92, pulse 65, temperature (!) 97.3 F (36.3 C), temperature source Tympanic, height 5' 5.5" (1.664 m), weight 148 lb (67.1 kg), SpO2 98 %. GENERAL:  Well developed, well nourished, woman sitting comfortably in the exam room in no acute distress. MENTAL STATUS:  Alert and oriented to person, place and time. HEAD:  Short styled dark hair with slight graying.  Normocephalic, atraumatic, face symmetric, no Cushingoid features. EYES:  Glasses.  Brown eyes.  Pupils equal round and reactive to light and accomodation.  No conjunctivitis or scleral icterus. ENT:  Oropharynx clear without lesion.  Tongue normal. Mucous membranes moist.  RESPIRATORY:  Clear to auscultation without rales, wheezes or rhonchi. CARDIOVASCULAR:  Regular rate and rhythm without murmur, rub or gallop. ABDOMEN:  Soft, non-tender, with active bowel sounds, and no hepatosplenomegaly.  No masses. SKIN:  No rashes, ulcers or lesions. EXTREMITIES: No edema, no skin discoloration or tenderness.  No palpable cords. LYMPH NODES: No palpable cervical, supraclavicular, axillary or inguinal adenopathy  NEUROLOGICAL: Unremarkable. PSYCH:  Appropriate.    Appointment on 12/13/2017  Component Date Value Ref Range Status  . LDH 12/13/2017 122  98 - 192 U/L Final   Performed at [facility], [address] [place], [place] [address]  . Sodium 12/13/2017 138  135 - 145 mmol/L Final  . Potassium 12/13/2017 4.2  3.5 - 5.1 mmol/L Final  . Chloride 12/13/2017 104  101 - 111 mmol/L Final  . CO2 12/13/2017 26  22 - 32  mmol/L Final  . Glucose, Bld 12/13/2017 101* 65 - 99 mg/dL Final  . BUN 12/13/2017 15  6 - 20 mg/dL Final  . Creatinine, Ser 12/13/2017 0.71  0.44 - 1.00 mg/dL Final  . Calcium 12/13/2017 9.5  8.9 - 10.3 mg/dL Final  . Total Protein 12/13/2017 7.3  6.5 - 8.1 g/dL Final  . Albumin 12/13/2017 4.4  3.5 - 5.0 g/dL Final  . AST 12/13/2017 22  15 - 41 U/L Final  . ALT 12/13/2017 25  14 - 54 U/L Final  . Alkaline Phosphatase 12/13/2017 45  38 - 126 U/L Final  . Total Bilirubin 12/13/2017 1.0  0.3 - 1.2 mg/dL Final  . GFR calc non Af Amer 12/13/2017 >60  >60 mL/min Final  . GFR calc Af Amer 12/13/2017 >60  >  60 mL/min Final   Comment: (NOTE) The eGFR has been calculated using the CKD EPI equation. This calculation has not been validated in all clinical situations. eGFR's persistently <60 mL/min signify possible Chronic Kidney Disease.   Georgiann Hahn gap 12/13/2017 8  5 - 15 Final   Performed at [facility], [address] [place], [place] [address]  . Uric Acid, Serum 12/13/2017 4.3  2.3 - 6.6 mg/dL Final   Performed at [facility], [address] [place], [place] [address]  . WBC 12/13/2017 6.7  3.6 - 11.0 K/uL Final  . RBC 12/13/2017 4.39  3.80 - 5.20 MIL/uL Final  . Hemoglobin 12/13/2017 13.2  12.0 - 16.0 g/dL Final  . HCT 12/13/2017 38.5  35.0 - 47.0 % Final  . MCV 12/13/2017 87.6  80.0 - 100.0 fL Final  . MCH 12/13/2017 30.1  26.0 - 34.0 pg Final  . MCHC 12/13/2017 34.3  32.0 - 36.0 g/dL Final  . RDW 12/13/2017 13.5  11.5 - 14.5 % Final  . Platelets 12/13/2017 210  150 - 440 K/uL Final  . Neutrophils Relative % 12/13/2017 52  % Final  . Neutro Abs 12/13/2017 3.5  1.4 - 6.5 K/uL Final  . Lymphocytes Relative 12/13/2017 34  % Final  . Lymphs Abs 12/13/2017 2.3  1.0 - 3.6 K/uL Final  . Monocytes Relative 12/13/2017 10  % Final  . Monocytes Absolute 12/13/2017 0.7  0.2 - 0.9 K/uL Final  . Eosinophils Relative 12/13/2017 3  % Final  . Eosinophils Absolute 12/13/2017 0.2   0 - 0.7 K/uL Final  . Basophils Relative 12/13/2017 1  % Final  . Basophils Absolute 12/13/2017 0.0  0 - 0.1 K/uL Final   Performed at [facility], [address] [place], [place] [address]    Assessment:  Anita Buckley is a 67 y.o. female with chronic lymphocytic leukemia (CLL).  She was diagnosed in 2015 after presenting with a goiter.  She has been followed without intervention.  Ultrasound of the soft tissue head and neck on 05/15/2014 revealed homogeneous thyroid gland with several thyroid nodules. There were several enlarged left-sided neck lymph nodes. In the left lateral neck there was an atypical enlarged 3.34 cm lymph node.   Fine needle aspirate of a left neck node in 05/2014 revealed all lymphocytes consistent with chronic lymphocytic leukemia/small lymphocytic lymphoma.  Flow cytometry revealed a clonal population of lymphocytes CD5+, CD23+, CD43 and CD+ positive. Thyroid nodule aspirate was negative for malignant cells.  PET scan on 06/06/2014 revealed an abnormal 1.1 cm mildly hypermetabolic level III left neck node.  There was activity in smaller station IB lymph nodes. There were numerous scattered small cervical lymph nodes bilaterally. There was faintly hypermetabolic bilateral axillary and subpectoral lymph nodes. Only one had borderline increased activity.  PET scan on 08/05/2015 revealed stable mild hypermetabolism within a 1.2 cm level III lymph node. There was hepatic steatosis and cholelithiasis. There was a tiny right renal stone.  Chest CT on 09/28/2016 revealed stable mild thoracic lymphadenopathy compatible with known CLL.  There was a 12 mm short axis left supraclavicular node, hypermetabolic on prior PET.  The bilateral axillary nodes were non FDG avid on prior PET scan.  She underwent left hemithyroidectomy on 08/20/2015.  Pathology revealed lymphocytic thyroiditis.  Mammogram on 07/23/2017 that demonstrated no mammographic evidence of malignancy.    Symptomatically, she denies any B symptoms.  She has some hip and back pain.  Exam reveals no adenopathy  or hepatosplenomegaly.  Labs are unremarkable.   Plan: 1.  Labs today:  CBC with diff, CMP, LDH, uric acid. 2.  Review mammogram- no evidence of malignancy.  3.  RTC in 6 months for MD assessment and labs (CBC with diff, CMP, LDH, uric acid).   Lequita Asal, MD  12/13/2017, 5:35 PM

## 2017-12-13 ENCOUNTER — Other Ambulatory Visit: Payer: Self-pay | Admitting: Family Medicine

## 2017-12-13 ENCOUNTER — Encounter: Payer: Self-pay | Admitting: Hematology and Oncology

## 2017-12-13 ENCOUNTER — Inpatient Hospital Stay: Payer: Medicare Other | Attending: Hematology and Oncology

## 2017-12-13 ENCOUNTER — Inpatient Hospital Stay (HOSPITAL_BASED_OUTPATIENT_CLINIC_OR_DEPARTMENT_OTHER): Payer: Medicare Other | Admitting: Hematology and Oncology

## 2017-12-13 VITALS — BP 148/92 | HR 65 | Temp 97.3°F | Ht 65.5 in | Wt 148.0 lb

## 2017-12-13 DIAGNOSIS — E785 Hyperlipidemia, unspecified: Secondary | ICD-10-CM

## 2017-12-13 DIAGNOSIS — C9110 Chronic lymphocytic leukemia of B-cell type not having achieved remission: Secondary | ICD-10-CM

## 2017-12-13 DIAGNOSIS — F418 Other specified anxiety disorders: Secondary | ICD-10-CM | POA: Insufficient documentation

## 2017-12-13 DIAGNOSIS — E063 Autoimmune thyroiditis: Secondary | ICD-10-CM | POA: Insufficient documentation

## 2017-12-13 DIAGNOSIS — Z79899 Other long term (current) drug therapy: Secondary | ICD-10-CM | POA: Diagnosis not present

## 2017-12-13 DIAGNOSIS — C9190 Lymphoid leukemia, unspecified not having achieved remission: Secondary | ICD-10-CM | POA: Insufficient documentation

## 2017-12-13 DIAGNOSIS — Z806 Family history of leukemia: Secondary | ICD-10-CM | POA: Insufficient documentation

## 2017-12-13 DIAGNOSIS — I10 Essential (primary) hypertension: Secondary | ICD-10-CM | POA: Diagnosis not present

## 2017-12-13 DIAGNOSIS — K760 Fatty (change of) liver, not elsewhere classified: Secondary | ICD-10-CM | POA: Diagnosis not present

## 2017-12-13 DIAGNOSIS — H40003 Preglaucoma, unspecified, bilateral: Secondary | ICD-10-CM | POA: Diagnosis not present

## 2017-12-13 DIAGNOSIS — M549 Dorsalgia, unspecified: Secondary | ICD-10-CM | POA: Diagnosis not present

## 2017-12-13 DIAGNOSIS — R590 Localized enlarged lymph nodes: Secondary | ICD-10-CM

## 2017-12-13 DIAGNOSIS — K8020 Calculus of gallbladder without cholecystitis without obstruction: Secondary | ICD-10-CM | POA: Diagnosis not present

## 2017-12-13 DIAGNOSIS — Z8052 Family history of malignant neoplasm of bladder: Secondary | ICD-10-CM

## 2017-12-13 LAB — COMPREHENSIVE METABOLIC PANEL
ALT: 25 U/L (ref 14–54)
AST: 22 U/L (ref 15–41)
Albumin: 4.4 g/dL (ref 3.5–5.0)
Alkaline Phosphatase: 45 U/L (ref 38–126)
Anion gap: 8 (ref 5–15)
BUN: 15 mg/dL (ref 6–20)
CO2: 26 mmol/L (ref 22–32)
Calcium: 9.5 mg/dL (ref 8.9–10.3)
Chloride: 104 mmol/L (ref 101–111)
Creatinine, Ser: 0.71 mg/dL (ref 0.44–1.00)
GFR calc Af Amer: >60 mL/min (ref >60)
GFR calc non Af Amer: >60 mL/min (ref >60)
Glucose, Bld: 101 mg/dL — ABNORMAL HIGH (ref 65–99)
Potassium: 4.2 mmol/L (ref 3.5–5.1)
Sodium: 138 mmol/L (ref 135–145)
Total Bilirubin: 1 mg/dL (ref 0.3–1.2)
Total Protein: 7.3 g/dL (ref 6.5–8.1)

## 2017-12-13 LAB — CBC WITH DIFFERENTIAL/PLATELET
Basophils Absolute: 0 10*3/uL (ref 0–0.1)
Basophils Relative: 1 %
Eosinophils Absolute: 0.2 10*3/uL (ref 0–0.7)
Eosinophils Relative: 3 %
HCT: 38.5 % (ref 35.0–47.0)
Hemoglobin: 13.2 g/dL (ref 12.0–16.0)
Lymphocytes Relative: 34 %
Lymphs Abs: 2.3 10*3/uL (ref 1.0–3.6)
MCH: 30.1 pg (ref 26.0–34.0)
MCHC: 34.3 g/dL (ref 32.0–36.0)
MCV: 87.6 fL (ref 80.0–100.0)
Monocytes Absolute: 0.7 10*3/uL (ref 0.2–0.9)
Monocytes Relative: 10 %
Neutro Abs: 3.5 10*3/uL (ref 1.4–6.5)
Neutrophils Relative %: 52 %
Platelets: 210 10*3/uL (ref 150–440)
RBC: 4.39 MIL/uL (ref 3.80–5.20)
RDW: 13.5 % (ref 11.5–14.5)
WBC: 6.7 10*3/uL (ref 3.6–11.0)

## 2017-12-13 LAB — LACTATE DEHYDROGENASE: LDH: 122 U/L (ref 98–192)

## 2017-12-13 LAB — URIC ACID: Uric Acid, Serum: 4.3 mg/dL (ref 2.3–6.6)

## 2017-12-13 NOTE — Progress Notes (Signed)
No new changes noted today 

## 2017-12-14 ENCOUNTER — Other Ambulatory Visit: Payer: Self-pay | Admitting: *Deleted

## 2017-12-14 DIAGNOSIS — C9110 Chronic lymphocytic leukemia of B-cell type not having achieved remission: Secondary | ICD-10-CM

## 2018-02-07 ENCOUNTER — Encounter: Payer: BLUE CROSS/BLUE SHIELD | Admitting: Family Medicine

## 2018-02-08 DIAGNOSIS — M778 Other enthesopathies, not elsewhere classified: Secondary | ICD-10-CM | POA: Insufficient documentation

## 2018-02-08 DIAGNOSIS — M779 Enthesopathy, unspecified: Secondary | ICD-10-CM

## 2018-02-09 ENCOUNTER — Encounter: Payer: Self-pay | Admitting: Podiatry

## 2018-02-09 ENCOUNTER — Ambulatory Visit (INDEPENDENT_AMBULATORY_CARE_PROVIDER_SITE_OTHER): Payer: Medicare Other | Admitting: Podiatry

## 2018-02-09 ENCOUNTER — Ambulatory Visit (INDEPENDENT_AMBULATORY_CARE_PROVIDER_SITE_OTHER): Payer: Medicare Other

## 2018-02-09 DIAGNOSIS — M2011 Hallux valgus (acquired), right foot: Secondary | ICD-10-CM

## 2018-02-09 DIAGNOSIS — M2012 Hallux valgus (acquired), left foot: Secondary | ICD-10-CM | POA: Diagnosis not present

## 2018-02-09 NOTE — Progress Notes (Signed)
Subjective:  Patient ID: Anita Buckley, female    DOB: 04-11-1951,  MRN: [id number] HPI Chief Complaint  Patient presents with  . Foot Orthotics    Interested in getting new orthotics--noticed increase in hip pain, thinks orthotics might be worn out  . New Patient (Initial Visit)    67 y.o. female presents with the above complaint.   ROS: Denies fever chills nausea vomiting muscle aches pains calf pain back pain chest pain shortness of breath.  Does relate lower back pain hip pain and knee pain.  Past Medical History:  Diagnosis Date  . Anxiety   . Cancer ([place])    B CELL LYMPHOMA  . Chronic lymphocytic leukemia (CLL), B-cell ([place]) 02/11/2016  . Depression   . History of anxiety   . History of depression   . History of fibrocystic disease of breast   . History of hyperlipidemia   . Hypertension   . Situational stress    Past Surgical History:  Procedure Laterality Date  . COLONOSCOPY  12/2005   polyps, tics  . COLONOSCOPY WITH PROPOFOL N/A 05/11/2016   Procedure: COLONOSCOPY WITH PROPOFOL;  Surgeon: Manya Silvas, MD;  Location: [facility] ENDOSCOPY;  Service: Endoscopy;  Laterality: N/A;  . THYROIDECTOMY Left 08/20/2015   Procedure: THYROIDECTOMY/ HEMITHYROIDECTOMY;  Surgeon: Beverly Gust, MD;  Location: [facility] ORS;  Service: ENT;  Laterality: Left;  . TUBAL LIGATION      Current Outpatient Medications:  .  alendronate (FOSAMAX) 70 MG tablet, TAKE ONE TABLET BY MOUTH ONCE A WEEK WITH A FULL GLASS OF WATER ON AN EMPTY STOMACH, Disp: 4 tablet, Rfl: 11 .  ALPRAZolam (XANAX) 0.5 MG tablet, Take 1 tablet (0.5 mg total) by mouth daily as needed for anxiety. (Patient not taking: Reported on 06/14/2017), Disp: 30 tablet, Rfl: 3 .  amLODipine (NORVASC) 5 MG tablet, Take 1 tablet (5 mg total) by mouth daily., Disp: 90 tablet, Rfl: 3 .  Calcium Citrate-Vitamin D (CITRACAL + D PO), Take 1-2 capsules by mouth daily. , Disp: , Rfl:  .  EPINEPHRINE 0.3 mg/0.3 mL IJ SOAJ injection, INJECT  0.3 ML INTO MUSCLE AS NEEDED, Disp: 2 Device, Rfl: 2 .  famotidine (PEPCID) 10 MG tablet, Take 1 tablet (10 mg total) by mouth daily. (Patient not taking: Reported on 12/13/2017), Disp: , Rfl:  .  latanoprost (XALATAN) 0.005 % ophthalmic solution, Place 1 drop into both eyes at bedtime., Disp: , Rfl:  .  Loratadine 10 MG CAPS, Take 1 capsule (10 mg total) by mouth daily., Disp: 30 each, Rfl:   Allergies  Allergen Reactions  . Bee Venom     Anaphylaxis   . Buspirone Hcl     REACTION: did not like  . Minocycline Nausea And Vomiting  . Penicillins     REACTION: reaction as a child   Review of Systems Objective:  There were no vitals filed for this visit.  General: Well developed, nourished, in no acute distress, alert and oriented x3   Dermatological: Skin is warm, dry and supple bilateral. Nails x 10 are well maintained; remaining integument appears unremarkable at this time. There are no open sores, no preulcerative lesions, no rash or signs of infection present.  Vascular: Dorsalis Pedis artery and Posterior Tibial artery pedal pulses are 2/4 bilateral with immedate capillary fill time. Pedal hair growth present. No varicosities and no lower extremity edema present bilateral.   Neruologic: Grossly intact via light touch bilateral. Vibratory intact via tuning fork bilateral. Protective threshold with Semmes  Wienstein monofilament intact to all pedal sites bilateral. Patellar and Achilles deep tendon reflexes 2+ bilateral. No Babinski or clonus noted bilateral.   Musculoskeletal: No gross boney pedal deformities bilateral. No pain, crepitus, or limitation noted with foot and ankle range of motion bilateral. Muscular strength 5/5 in all groups tested bilateral.  Gait: Unassisted, Nonantalgic.    Radiographs:  Radiographs demonstrate pes planus and significant osteoarthritis first metatarsophalangeal joint right greater than left dorsal spurring joint space narrowing subchondral  sclerosis.  Otherwise no acute findings.    Assessment & Plan:   Assessment: Pes planus hallux limitus history of plantar fasciitis.  Plan: Discussed etiology pathology conservative surgical therapies she is casted for new orthotics.     Max T. Athalia, [place]

## 2018-02-15 ENCOUNTER — Encounter: Payer: Self-pay | Admitting: Hematology and Oncology

## 2018-03-01 ENCOUNTER — Encounter: Payer: Self-pay | Admitting: Family Medicine

## 2018-03-01 ENCOUNTER — Ambulatory Visit (INDEPENDENT_AMBULATORY_CARE_PROVIDER_SITE_OTHER): Payer: Medicare Other | Admitting: Family Medicine

## 2018-03-01 ENCOUNTER — Telehealth: Payer: Self-pay | Admitting: *Deleted

## 2018-03-01 VITALS — BP 122/80 | HR 64 | Temp 98.1°F | Ht 65.5 in | Wt 149.0 lb

## 2018-03-01 DIAGNOSIS — Z0000 Encounter for general adult medical examination without abnormal findings: Secondary | ICD-10-CM | POA: Diagnosis not present

## 2018-03-01 DIAGNOSIS — Z23 Encounter for immunization: Secondary | ICD-10-CM

## 2018-03-01 DIAGNOSIS — E2839 Other primary ovarian failure: Secondary | ICD-10-CM | POA: Diagnosis not present

## 2018-03-01 DIAGNOSIS — C8590 Non-Hodgkin lymphoma, unspecified, unspecified site: Secondary | ICD-10-CM

## 2018-03-01 DIAGNOSIS — E7800 Pure hypercholesterolemia, unspecified: Secondary | ICD-10-CM | POA: Diagnosis not present

## 2018-03-01 DIAGNOSIS — I10 Essential (primary) hypertension: Secondary | ICD-10-CM | POA: Diagnosis not present

## 2018-03-01 DIAGNOSIS — M8580 Other specified disorders of bone density and structure, unspecified site: Secondary | ICD-10-CM

## 2018-03-01 DIAGNOSIS — R739 Hyperglycemia, unspecified: Secondary | ICD-10-CM | POA: Diagnosis not present

## 2018-03-01 MED ORDER — AMLODIPINE BESYLATE 5 MG PO TABS: 5.0000 mg | ORAL_TABLET | Freq: Every day | ORAL | 3 refills | Status: DC

## 2018-03-01 MED ORDER — ALENDRONATE SODIUM 70 MG PO TABS: ORAL_TABLET | ORAL | 3 refills | Status: DC

## 2018-03-01 NOTE — Patient Instructions (Addendum)
Get [place] a copy of your advance directive when you get a chance   Call the oncology office and let them know about your glands   Blood pressure is good  Labs today  prevnar vaccine today   We will refer you for bone density test at check out

## 2018-03-01 NOTE — Telephone Encounter (Signed)
Patient reports that her saliva glands are swollen. She saw PCP today and he advised her to contact Dr. Mike Gip.

## 2018-03-01 NOTE — Progress Notes (Signed)
Subjective:    Patient ID: Anita Buckley, female    DOB: 1951-08-14, 67 y.o.   MRN: [id number]  HPI  I have personally reviewed the Medicare Annual Wellness questionnaire and have noted 1. The patient's medical and social history 2. Their use of alcohol, tobacco or illicit drugs 3. Their current medications and supplements 4. The patient's functional ability including ADL's, fall risks, home safety risks and hearing or visual             impairment. 5. Diet and physical activities 6. Evidence for depression or mood disorders  The patients weight, height, BMI have been recorded in the chart and visual acuity is per eye clinic.  I have made referrals, counseling and provided education to the patient based review of the above and I have provided the pt with a written personalized care plan for preventive services. Reviewed and updated provider list, see scanned forms.  See scanned forms.  Routine anticipatory guidance given to patient.  See health maintenance. Colon cancer screening colonoscopy 9/17 with 5 y recall  Breast cancer screening mammogram 12/18 Self breast exam- no lumps (no gyn symptoms Pap 7/18 neg with neg hpv  Flu vaccine 8/18-gets every fall  Tetanus vaccine   Tdap 5/15 Pneumovax-will get prevnar today  Zoster vaccine- zostavax 10/08 dexa 11/16 -osteopenia  On fosamax  Ready to order that  No falls or fractures Taking ca and D  Exercise - not a lot / needs to get back to yoga /does a lot of gardening  Advance directive-has both living will and power of attorney  Cognitive function addressed- see scanned forms- and if abnormal then additional documentation follows.  Memory is fine / just gets overwhelmed with care of husband   PMH and SH reviewed  Meds, vitals, and allergies reviewed.   ROS: See HPI.  Otherwise negative.     Hearing Screening   125Hz  250Hz  500Hz  1000Hz  2000Hz  3000Hz  4000Hz  6000Hz  8000Hz   Right ear:   40 40 40  40    Left ear:   40 40  40  40    Vision Screening Comments: Pt has an eye exam every 6 months with Dr. Lendon Ka Readings from Last 3 Encounters:  03/01/18 149 lb (67.6 kg)  12/13/17 148 lb (67.1 kg)  06/14/17 149 lb (67.6 kg)  weight looks good  24.42 kg/m    Hx of CLL Lab Results  Component Value Date   WBC 6.7 12/13/2017   HGB 13.2 12/13/2017   HCT 38.5 12/13/2017   MCV 87.6 12/13/2017   PLT 210 12/13/2017   Hemithyroidectomy 1/17  Lab Results  Component Value Date   TSH 4.28 03/01/2017   due for labs No symptoms    bp is stable today - a little anxious today /up from nl  No cp or palpitations or headaches or edema  No side effects to medicines  BP Readings from Last 3 Encounters:  03/01/18 (!) 142/90  12/13/17 (!) 148/92  06/14/17 125/82     Hyperlipidemia  Lab Results  Component Value Date   CHOL 207 (H) 03/01/2017   HDL 66.20 03/01/2017   LDLCALC 127 (H) 03/01/2017   LDLDIRECT 126.4 06/28/2012   TRIG 70.0 03/01/2017   CHOLHDL 3 03/01/2017  no big change in diet  Hyperglycemia Lab Results  Component Value Date   HGBA1C 5.5 03/01/2017   Due for labs   Thinks saliva glands are swollen No lymph notes  Due for lymphoma -- October /  for f/u  Dry mouth at night   Patient Active Problem List   Diagnosis Date Noted  . Welcome to Medicare preventive visit 03/01/2018  . Tendinitis of hand 02/08/2018  . Encounter for annual routine gynecological examination 03/01/2017  . Chronic lymphocytic leukemia (CLL), B-cell ([place]) 02/11/2016  . Colon cancer screening 01/25/2015  . Estrogen deficiency 01/25/2015  . Lymphoma ([place]) 06/04/2014  . Osteopenia 01/06/2013  . HTN (hypertension), benign 06/28/2012  . Other screening mammogram 01/05/2012  . Gynecological examination 01/05/2012  . Post-menopause 01/05/2012  . Routine general medical examination at a health care facility 12/31/2011  . PURE HYPERCHOLESTEROLEMIA 08/15/2008  . ANXIETY 12/08/2007  . DEPRESSION 03/30/2007  .  FIBROCYSTIC BREAST DISEASE 03/30/2007  . Hyperglycemia 03/30/2007   Past Medical History:  Diagnosis Date  . Anxiety   . Cancer ([place])    B CELL LYMPHOMA  . Chronic lymphocytic leukemia (CLL), B-cell ([place]) 02/11/2016  . Depression   . History of anxiety   . History of depression   . History of fibrocystic disease of breast   . History of hyperlipidemia   . Hypertension   . Situational stress    Past Surgical History:  Procedure Laterality Date  . COLONOSCOPY  12/2005   polyps, tics  . COLONOSCOPY WITH PROPOFOL N/A 05/11/2016   Procedure: COLONOSCOPY WITH PROPOFOL;  Surgeon: Manya Silvas, MD;  Location: [facility] ENDOSCOPY;  Service: Endoscopy;  Laterality: N/A;  . THYROIDECTOMY Left 08/20/2015   Procedure: THYROIDECTOMY/ HEMITHYROIDECTOMY;  Surgeon: Beverly Gust, MD;  Location: [facility] ORS;  Service: ENT;  Laterality: Left;  . TUBAL LIGATION     Social History   Tobacco Use  . Smoking status: Never Smoker  . Smokeless tobacco: Never Used  Substance Use Topics  . Alcohol use: Yes    Alcohol/week: 0.0 oz    Comment: occ (wine with dinner)  . Drug use: No   Family History  Problem Relation Age of Onset  . Diabetes Father        late in life  . Alcohol abuse Father   . Hyperlipidemia Mother   . Mental illness Mother   . Depression Sister   . Leukemia Brother   . Bladder Cancer Brother   . Schizophrenia Other        Grandfather  . Breast cancer Neg Hx    Allergies  Allergen Reactions  . Bee Venom     Anaphylaxis   . Buspirone Hcl     REACTION: did not like  . Minocycline Nausea And Vomiting  . Penicillins     REACTION: reaction as a child   Current Outpatient Medications on File Prior to Visit  Medication Sig Dispense Refill  . ALPRAZolam (XANAX) 0.5 MG tablet Take 1 tablet (0.5 mg total) by mouth daily as needed for anxiety. 30 tablet 3  . Calcium Citrate-Vitamin D (CITRACAL + D PO) Take 1-2 capsules by mouth daily.     Marland Kitchen EPINEPHRINE 0.3 mg/0.3 mL IJ SOAJ  injection INJECT 0.3 ML INTO MUSCLE AS NEEDED 2 Device 2  . famotidine (PEPCID) 10 MG tablet Take 1 tablet (10 mg total) by mouth daily.    Marland Kitchen latanoprost (XALATAN) 0.005 % ophthalmic solution Place 1 drop into both eyes at bedtime.    . Loratadine 10 MG CAPS Take 1 capsule (10 mg total) by mouth daily. 30 each    No current facility-administered medications on file prior to visit.     Review of Systems  Constitutional: Negative for activity change,  appetite change, fatigue, fever and unexpected weight change.  HENT: Negative for congestion, ear pain, rhinorrhea, sinus pressure, sore throat and trouble swallowing.   Eyes: Negative for pain, redness and visual disturbance.  Respiratory: Negative for cough, shortness of breath and wheezing.   Cardiovascular: Negative for chest pain and palpitations.  Gastrointestinal: Negative for abdominal pain, blood in stool, constipation and diarrhea.  Endocrine: Negative for polydipsia and polyuria.  Genitourinary: Negative for dysuria, frequency and urgency.  Musculoskeletal: Negative for arthralgias, back pain and myalgias.  Skin: Negative for pallor and rash.  Allergic/Immunologic: Negative for environmental allergies.  Neurological: Negative for dizziness, syncope and headaches.  Hematological: Negative for adenopathy. Does not bruise/bleed easily.  Psychiatric/Behavioral: Negative for decreased concentration and dysphoric mood. The patient is not nervous/anxious.        Stressed with caregiving        Objective:   Physical Exam  Constitutional: She appears well-developed and well-nourished. No distress.  Well appearing   HENT:  Head: Normocephalic and atraumatic.  Right Ear: External ear normal.  Left Ear: External ear normal.  Mouth/Throat: Oropharynx is clear and moist.  Eyes: Pupils are equal, round, and reactive to light. Conjunctivae and EOM are normal. No scleral icterus.  Neck: Normal range of motion. Neck supple. No JVD present.  Carotid bruit is not present. No thyromegaly present.  Cardiovascular: Normal rate, regular rhythm, normal heart sounds and intact distal pulses. Exam reveals no gallop.  Pulmonary/Chest: Effort normal and breath sounds normal. No respiratory distress. She has no wheezes. She exhibits no tenderness. No breast tenderness, discharge or bleeding.  Abdominal: Soft. Bowel sounds are normal. She exhibits no distension, no abdominal bruit and no mass. There is no tenderness.  Genitourinary: No breast tenderness, discharge or bleeding.  Genitourinary Comments: Breast exam: No mass, nodules, thickening, tenderness, bulging, retraction, inflamation, nipple discharge or skin changes noted.  No axillary or clavicular LA.      Musculoskeletal: Normal range of motion. She exhibits no edema or tenderness.  Lymphadenopathy:    She has no cervical adenopathy.  Neurological: She is alert. She has normal reflexes. She displays normal reflexes. No cranial nerve deficit. She exhibits normal muscle tone. Coordination normal.  Skin: Skin is warm and dry. No rash noted. No erythema. No pallor.  Solar lentigines diffusely   Psychiatric: She has a normal mood and affect.  Pleasant           Assessment & Plan:   Problem List Items Addressed This Visit      Cardiovascular and Mediastinum   HTN (hypertension), benign    bp in fair control at this time  BP Readings from Last 1 Encounters:  03/01/18 122/80   No changes needed Most recent labs reviewed  Disc lifstyle change with low sodium diet and exercise        Relevant Medications   amLODipine (NORVASC) 5 MG tablet   Other Relevant Orders   CBC with Differential/Platelet   Comprehensive metabolic panel   Lipid panel   TSH     Musculoskeletal and Integument   Osteopenia    dexa ordered Continues fosamax Exercise/ca/D No falls or fx         Other   Estrogen deficiency    Ref for dexa      Relevant Orders   DG Bone Density    Hyperglycemia    Lab Results  Component Value Date   HGBA1C 5.5 03/01/2017  ordered today disc imp of low glycemic diet and wt  loss to prevent DM2       Relevant Orders   Hemoglobin A1c   Lymphoma ([place])    Pt feels like sub mandibular glands are more swollen Cbc today  She will reach out to her oncologist  No pain or other symptoms      PURE HYPERCHOLESTEROLEMIA    Disc goals for lipids and reasons to control them Rev last labs with pt Rev low sat fat diet in detail  Lipid panel today - last LDL under 130      Relevant Medications   amLODipine (NORVASC) 5 MG tablet   Other Relevant Orders   Lipid panel   Welcome to Medicare preventive visit - Primary    Reviewed health habits including diet and exercise and skin cancer prevention Reviewed appropriate screening tests for age  Also reviewed health mt list, fam hx and immunization status , as well as social and family history   See HPI Labs ordered  dexa ordered  prevnar today Will get [place] copy of adv directive        Other Visit Diagnoses    Need for vaccination with 13-polyvalent pneumococcal conjugate vaccine       Relevant Orders   Pneumococcal conjugate vaccine 13-valent (Completed)

## 2018-03-02 ENCOUNTER — Other Ambulatory Visit: Payer: Medicare Other | Admitting: Orthotics

## 2018-03-02 LAB — COMPREHENSIVE METABOLIC PANEL
ALBUMIN: 4.5 g/dL (ref 3.5–5.2)
ALK PHOS: 41 U/L (ref 39–117)
ALT: 20 U/L (ref 0–35)
AST: 21 U/L (ref 0–37)
BILIRUBIN TOTAL: 0.4 mg/dL (ref 0.2–1.2)
BUN: 18 mg/dL (ref 6–23)
CALCIUM: 9.2 mg/dL (ref 8.4–10.5)
CO2: 28 mEq/L (ref 19–32)
Chloride: 105 mEq/L (ref 96–112)
Creatinine, Ser: 0.75 mg/dL (ref 0.40–1.20)
GFR: 82.34 mL/min (ref >60.00)
GLUCOSE: 82 mg/dL (ref 70–99)
POTASSIUM: 4.1 meq/L (ref 3.5–5.1)
Sodium: 140 mEq/L (ref 135–145)
Total Protein: 7.4 g/dL (ref 6.0–8.3)

## 2018-03-02 LAB — CBC WITH DIFFERENTIAL/PLATELET
BASOS ABS: 0.1 10*3/uL (ref 0.0–0.1)
Basophils Relative: 0.7 % (ref 0.0–3.0)
EOS PCT: 2.9 % (ref 0.0–5.0)
Eosinophils Absolute: 0.3 10*3/uL (ref 0.0–0.7)
HEMATOCRIT: 39.8 % (ref 36.0–46.0)
HEMOGLOBIN: 13.5 g/dL (ref 12.0–15.0)
LYMPHS PCT: 41.5 % (ref 12.0–46.0)
Lymphs Abs: 3.6 10*3/uL (ref 0.7–4.0)
MCHC: 33.9 g/dL (ref 30.0–36.0)
MCV: 88.8 fl (ref 78.0–100.0)
Monocytes Absolute: 0.8 10*3/uL (ref 0.1–1.0)
Monocytes Relative: 8.8 % (ref 3.0–12.0)
Neutro Abs: 4 10*3/uL (ref 1.4–7.7)
Neutrophils Relative %: 46.1 % (ref 43.0–77.0)
Platelets: 228 10*3/uL (ref 150.0–400.0)
RBC: 4.48 Mil/uL (ref 3.87–5.11)
RDW: 13.2 % (ref 11.5–15.5)
WBC: 8.7 10*3/uL (ref 4.0–10.5)

## 2018-03-02 LAB — LIPID PANEL
CHOLESTEROL: 214 mg/dL — AB (ref 0–200)
HDL: 64.3 mg/dL (ref >39.00)
LDL Cholesterol: 127 mg/dL — ABNORMAL HIGH (ref 0–99)
NONHDL: 149.77
TRIGLYCERIDES: 115 mg/dL (ref 0.0–149.0)
Total CHOL/HDL Ratio: 3
VLDL: 23 mg/dL (ref 0.0–40.0)

## 2018-03-02 LAB — HEMOGLOBIN A1C: Hgb A1c MFr Bld: 5.8 % (ref 4.6–6.5)

## 2018-03-02 LAB — TSH: TSH: 4.22 u[IU]/mL (ref 0.35–4.50)

## 2018-03-02 NOTE — Assessment & Plan Note (Signed)
dexa ordered Continues fosamax Exercise/ca/D No falls or fx

## 2018-03-02 NOTE — Assessment & Plan Note (Signed)
Disc goals for lipids and reasons to control them Rev last labs with pt Rev low sat fat diet in detail  Lipid panel today - last LDL under 130

## 2018-03-02 NOTE — Assessment & Plan Note (Signed)
Lab Results  Component Value Date   HGBA1C 5.5 03/01/2017  ordered today disc imp of low glycemic diet and wt loss to prevent DM2

## 2018-03-02 NOTE — Assessment & Plan Note (Signed)
Ref for dexa 

## 2018-03-02 NOTE — Assessment & Plan Note (Signed)
bp in fair control at this time  BP Readings from Last 1 Encounters:  03/01/18 122/80   No changes needed Most recent labs reviewed  Disc lifstyle change with low sodium diet and exercise

## 2018-03-02 NOTE — Assessment & Plan Note (Signed)
Reviewed health habits including diet and exercise and skin cancer prevention Reviewed appropriate screening tests for age  Also reviewed health mt list, fam hx and immunization status , as well as social and family history   See HPI Labs ordered  dexa ordered  prevnar today Will get [place] copy of adv directive

## 2018-03-02 NOTE — Assessment & Plan Note (Signed)
Pt feels like sub mandibular glands are more swollen Cbc today  She will reach out to her oncologist  No pain or other symptoms

## 2018-03-04 ENCOUNTER — Telehealth: Payer: Self-pay | Admitting: *Deleted

## 2018-03-04 NOTE — Telephone Encounter (Signed)
Patient is requesting a letter stating that she is being followed by Dr. Mike Gip for CLL. She would like the letter e-mailed to [email or web address]

## 2018-03-04 NOTE — Telephone Encounter (Signed)
Patient does not need letter instead she needs FMLA form. I have placed form in [employer] and notified patient.

## 2018-03-16 ENCOUNTER — Telehealth: Payer: Self-pay | Admitting: *Deleted

## 2018-03-16 ENCOUNTER — Ambulatory Visit (INDEPENDENT_AMBULATORY_CARE_PROVIDER_SITE_OTHER): Payer: Medicare Other | Admitting: Orthotics

## 2018-03-16 DIAGNOSIS — M2011 Hallux valgus (acquired), right foot: Secondary | ICD-10-CM

## 2018-03-16 DIAGNOSIS — M2012 Hallux valgus (acquired), left foot: Principal | ICD-10-CM

## 2018-03-16 NOTE — Telephone Encounter (Signed)
Called patient to inform her that Dr. Mike Gip is not comfortable signing the FMLA paperwork for her son.  I advised her that perhaps her PCP would be willing to do so.  She states she understands perfectly.  Patient does have CLL, however, is not actively getting treatment at this time.  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  Since the time it was sent, HR has resolved what they needed.

## 2018-03-16 NOTE — Progress Notes (Signed)
Patient came in today to pick up custom made foot orthotics.  The goals were accomplished and the patient reported no dissatisfaction with said orthotics.  Patient was advised of breakin period and how to report any issues. 

## 2018-03-27 NOTE — Progress Notes (Signed)
Anita Buckley day:  03/28/18  Chief Complaint: Anita Buckley is a 67 y.o. female with chronic lymphocytic leukemia who is seen for 4 month assessment.  HPI:  The patient was last seen in the medical oncology [facility] on 12/13/2017.  At that time, patient was having pain in her hips and back that she attributed to lack of exercise and flat feet. She was active and participating in yoga. No acute concerns. No B symptoms. Exam revealed no adenopathy or hepatosplenomegaly. Labs were unremarkable.   She was seen in consult on 02/09/2018 by Dr. Tyson Dense (podiatry). Notes reviewed. Plain films of her feet reveled pes planus with significant OA (R>L). Casted for new orthotics to help with complaints of bilateral foot pain.   Bone density study performed prior to [facility] visit today. Results reveal a T-score of -1.4 in the RIGHT femoral neck. Not a candidate for FRAX due to oral bisphosphonate.   In the interim, patient has been experiencing swelling to her saliva glands. Glands are painless. LEFT side will often swell to the size of a golf ball that reduces in size with manual manipulation. She denies associated otalgia. No fevers or recent sinus infections.   Patient denies that she has experienced any B symptoms. She denies any interval infections.  Patient advises that she maintains an adequate appetite. She is eating well. Weight today is 148 lb 9 oz (67.4 kg), which compared to her last visit to the [facility], represents a stable weight.   Patient denies pain in the [facility] today.   Past Medical History:  Diagnosis Date  . Anxiety   . Cancer ([place])    B CELL LYMPHOMA  . Chronic lymphocytic leukemia (CLL), B-cell ([place]) 02/11/2016  . Depression   . History of anxiety   . History of depression   . History of fibrocystic disease of breast   . History of hyperlipidemia   . Hypertension   . Situational stress     Past Surgical History:  Procedure  Laterality Date  . COLONOSCOPY  12/2005   polyps, tics  . COLONOSCOPY WITH PROPOFOL N/A 05/11/2016   Procedure: COLONOSCOPY WITH PROPOFOL;  Surgeon: Manya Silvas, MD;  Location: [facility] ENDOSCOPY;  Service: Endoscopy;  Laterality: N/A;  . THYROIDECTOMY Left 08/20/2015   Procedure: THYROIDECTOMY/ HEMITHYROIDECTOMY;  Surgeon: Beverly Gust, MD;  Location: [facility] ORS;  Service: ENT;  Laterality: Left;  . TUBAL LIGATION      Family History  Problem Relation Age of Onset  . Diabetes Father        late in life  . Alcohol abuse Father   . Hyperlipidemia Mother   . Mental illness Mother   . Depression Sister   . Leukemia Brother   . Bladder Cancer Brother   . Schizophrenia Other        Grandfather  . Breast cancer Neg Hx     Social History:  reports that she has never smoked. She has never used smokeless tobacco. She reports that she drinks alcohol. She reports that she does not use drugs.  She lives in [place].  Her husband is a Paediatric nurse.  She is alone today.  Allergies:  Allergies  Allergen Reactions  . Bee Venom     Anaphylaxis   . Buspirone Hcl     REACTION: did not like  . Minocycline Nausea And Vomiting  . Penicillins     REACTION: reaction as a child    Current Medications: Current  Outpatient Medications  Medication Sig Dispense Refill  . alendronate (FOSAMAX) 70 MG tablet TAKE ONE TABLET BY MOUTH ONCE A WEEK WITH A FULL GLASS OF WATER ON AN EMPTY STOMACH 12 tablet 3  . ALPRAZolam (XANAX) 0.5 MG tablet Take 1 tablet (0.5 mg total) by mouth daily as needed for anxiety. 30 tablet 3  . amLODipine (NORVASC) 5 MG tablet Take 1 tablet (5 mg total) by mouth daily. 90 tablet 3  . Calcium Citrate-Vitamin D (CITRACAL + D PO) Take 1-2 capsules by mouth daily.     Marland Kitchen EPINEPHRINE 0.3 mg/0.3 mL IJ SOAJ injection INJECT 0.3 ML INTO MUSCLE AS NEEDED 2 Device 2  . famotidine (PEPCID) 10 MG tablet Take 1 tablet (10 mg total) by mouth daily.    Marland Kitchen latanoprost (XALATAN) 0.005 %  ophthalmic solution Place 1 drop into both eyes at bedtime.    . Loratadine 10 MG CAPS Take 1 capsule (10 mg total) by mouth daily. 30 each    No current facility-administered medications for this visit.     Review of Systems  Constitutional: Negative for diaphoresis, fever, malaise/fatigue and weight loss (weight stable).       "I am doing well except for my swollen salivary glands. Nothing else is different".   HENT: Negative.  Negative for congestion, nosebleeds and sore throat.        "Salivary gland" swelling  Eyes: Negative.   Respiratory: Negative for cough, hemoptysis, sputum production and shortness of breath.   Cardiovascular: Negative for chest pain, palpitations, orthopnea, leg swelling and PND.  Gastrointestinal: Negative for abdominal pain, blood in stool, constipation, diarrhea, melena, nausea and vomiting.  Genitourinary: Negative for dysuria, frequency, hematuria and urgency.  Musculoskeletal: Positive for back pain and joint pain. Negative for falls and myalgias.  Skin: Negative for itching and rash.  Neurological: Negative for dizziness, tremors, weakness and headaches.  Endo/Heme/Allergies: Does not bruise/bleed easily.  Psychiatric/Behavioral: Negative for depression, memory loss and suicidal ideas. The patient is not nervous/anxious and does not have insomnia.   All other systems reviewed and are negative.  Performance status (ECOG): 1 - Symptomatic but completely ambulatory  Vital Signs BP (!) 147/99 (BP Location: Left Arm, Patient Position: Sitting)   Pulse 76   Temp (!) 96.2 F (35.7 C) (Tympanic)   Resp 18   Wt 148 lb 9 oz (67.4 kg)   BMI 24.35 kg/m   Physical Exam  Constitutional: She is oriented to person, place, and time and well-developed, well-nourished, and in no distress. No distress.  HENT:  Head: Normocephalic and atraumatic.  Dark hair with graying  Eyes: Pupils are equal, round, and reactive to light. Conjunctivae and EOM are normal. No  scleral icterus.  Glasses.  Brown eyes.  Neck: Normal range of motion. Neck supple. No JVD present.  Cardiovascular: Normal rate, regular rhythm and normal heart sounds. Exam reveals no gallop and no friction rub.  No murmur heard. Pulmonary/Chest: Effort normal and breath sounds normal. No respiratory distress. She has no wheezes. She has no rales.  Abdominal: Soft. Bowel sounds are normal. She exhibits no distension and no mass. There is no tenderness. There is no rebound and no guarding.  Musculoskeletal: Normal range of motion. She exhibits no edema or tenderness.  Lymphadenopathy:    She has no cervical adenopathy.    She has no axillary adenopathy.       Right: No inguinal and no supraclavicular adenopathy present.       Left: No inguinal and  no supraclavicular adenopathy present.  Neurological: She is alert and oriented to person, place, and time. Gait normal.  Skin: Skin is warm and dry. No rash noted. She is not diaphoretic. No erythema.  Psychiatric: Mood, affect and judgment normal.  Nursing note and vitals reviewed.   No visits with results within 3 Day(s) from this visit.  Latest known visit with results is:  Office Visit on 03/01/2018  Component Date Value Ref Range Status  . WBC 03/01/2018 8.7  4.0 - 10.5 K/uL Final  . RBC 03/01/2018 4.48  3.87 - 5.11 Mil/uL Final  . Hemoglobin 03/01/2018 13.5  12.0 - 15.0 g/dL Final  . HCT 03/01/2018 39.8  36.0 - 46.0 % Final  . MCV 03/01/2018 88.8  78.0 - 100.0 fl Final  . MCHC 03/01/2018 33.9  30.0 - 36.0 g/dL Final  . RDW 03/01/2018 13.2  11.5 - 15.5 % Final  . Platelets 03/01/2018 228.0  150.0 - 400.0 K/uL Final  . Neutrophils Relative % 03/01/2018 46.1  43.0 - 77.0 % Final  . Lymphocytes Relative 03/01/2018 41.5  12.0 - 46.0 % Final  . Monocytes Relative 03/01/2018 8.8  3.0 - 12.0 % Final  . Eosinophils Relative 03/01/2018 2.9  0.0 - 5.0 % Final  . Basophils Relative 03/01/2018 0.7  0.0 - 3.0 % Final  . Neutro Abs 03/01/2018  4.0  1.4 - 7.7 K/uL Final  . Lymphs Abs 03/01/2018 3.6  0.7 - 4.0 K/uL Final  . Monocytes Absolute 03/01/2018 0.8  0.1 - 1.0 K/uL Final  . Eosinophils Absolute 03/01/2018 0.3  0.0 - 0.7 K/uL Final  . Basophils Absolute 03/01/2018 0.1  0.0 - 0.1 K/uL Final  . Sodium 03/01/2018 140  135 - 145 mEq/L Final  . Potassium 03/01/2018 4.1  3.5 - 5.1 mEq/L Final  . Chloride 03/01/2018 105  96 - 112 mEq/L Final  . CO2 03/01/2018 28  19 - 32 mEq/L Final  . Glucose, Bld 03/01/2018 82  70 - 99 mg/dL Final  . BUN 03/01/2018 18  6 - 23 mg/dL Final  . Creatinine, Ser 03/01/2018 0.75  0.40 - 1.20 mg/dL Final  . Total Bilirubin 03/01/2018 0.4  0.2 - 1.2 mg/dL Final  . Alkaline Phosphatase 03/01/2018 41  39 - 117 U/L Final  . AST 03/01/2018 21  0 - 37 U/L Final  . ALT 03/01/2018 20  0 - 35 U/L Final  . Total Protein 03/01/2018 7.4  6.0 - 8.3 g/dL Final  . Albumin 03/01/2018 4.5  3.5 - 5.2 g/dL Final  . Calcium 03/01/2018 9.2  8.4 - 10.5 mg/dL Final  . GFR 03/01/2018 82.34  >60.00 mL/min Final  . Cholesterol 03/01/2018 214* 0 - 200 mg/dL Final   ATP III Classification       Desirable:  < 200 mg/dL               Borderline High:  200 - 239 mg/dL          High:  > = 240 mg/dL  . Triglycerides 03/01/2018 115.0  0.0 - 149.0 mg/dL Final   Normal:  <150 mg/dLBorderline High:  150 - 199 mg/dL  . HDL 03/01/2018 64.30  >39.00 mg/dL Final  . VLDL 03/01/2018 23.0  0.0 - 40.0 mg/dL Final  . LDL Cholesterol 03/01/2018 127* 0 - 99 mg/dL Final  . Total CHOL/HDL Ratio 03/01/2018 3   Final                  Men  Women1/2 Average Risk     3.4          3.3Average Risk          5.0          4.42X Average Risk          9.6          7.13X Average Risk          15.0          11.0                      . NonHDL 03/01/2018 149.77   Final   NOTE:  Non-HDL goal should be 30 mg/dL higher than patient's LDL goal (i.e. LDL goal of < 70 mg/dL, would have non-HDL goal of < 100 mg/dL)  . TSH 03/01/2018 4.22  0.35 - 4.50 uIU/mL Final   . Hgb A1c MFr Bld 03/01/2018 5.8  4.6 - 6.5 % Final   Glycemic Control Guidelines for People with Diabetes:Non Diabetic:  <6%Goal of Therapy: <7%Additional Action Suggested:  >8%     Assessment:  Anita Buckley is a 67 y.o. female with chronic lymphocytic leukemia (CLL).  She was diagnosed in 2015 after presenting with a goiter.  She has been followed without intervention.  Ultrasound of the soft tissue head and neck on 05/15/2014 revealed homogeneous thyroid gland with several thyroid nodules. There were several enlarged left-sided neck lymph nodes. In the left lateral neck there was an atypical enlarged 3.34 cm lymph node.   Fine needle aspirate of a left neck node in 05/2014 revealed all lymphocytes consistent with chronic lymphocytic leukemia/small lymphocytic lymphoma.  Flow cytometry revealed a clonal population of lymphocytes CD5+, CD23+, CD43 and CD+ positive. Thyroid nodule aspirate was negative for malignant cells.  PET scan on 06/06/2014 revealed an abnormal 1.1 cm mildly hypermetabolic level III left neck node.  There was activity in smaller station IB lymph nodes. There were numerous scattered small cervical lymph nodes bilaterally. There was faintly hypermetabolic bilateral axillary and subpectoral lymph nodes. Only one had borderline increased activity.  PET scan on 08/05/2015 revealed stable mild hypermetabolism within a 1.2 cm level III lymph node. There was hepatic steatosis and cholelithiasis. There was a tiny right renal stone.  Chest CT on 09/28/2016 revealed stable mild thoracic lymphadenopathy compatible with known CLL.  There was a 12 mm short axis left supraclavicular node, hypermetabolic on prior PET.  The bilateral axillary nodes were non FDG avid on prior PET scan.  She underwent left hemithyroidectomy on 08/20/2015.  Pathology revealed lymphocytic thyroiditis.    Mammogram on 07/23/2017 demonstrated no mammographic evidence of malignancy.   Bone density on  03/28/2018 revealed osteopenia with a T-score of -1.4 in the RIGHT femoral neck.   Symptomatically, patient is doing well overall. She notes swelling (painless) to her "salivary glands". Patient has been seen by PCP and advised to follow up here.  Exam reveals no adenopathy or hepatosplenomegaly.  Labs from 03/01/2018 were normal.   Plan: 1. Chronic lymphocytic leukemia:  Clinically stable.  CBC is normal. 2. Osteopenia:  Bone density reviewed.  Discuss calcium and vitamin D.  3. Salivary gland swelling:  Discuss f/u with ENT if salivary gland swelling does not resolve. 4. RTC in 6 months for MD assessment and labs (CBC with diff, CMP, LDH, uric acid).   Honor Loh, NP  03/28/18, 3:06 PM   I saw and evaluated the patient, participating in the key portions of  the service and reviewing pertinent diagnostic studies and records.  I reviewed the nurse practitioner's note and agree with the findings and the plan.  The assessment and plan were discussed with the patient.  A few questions were asked by the patient and answered.   Nolon Stalls, MD 03/28/2018,3:06 PM

## 2018-03-28 ENCOUNTER — Ambulatory Visit
Admission: RE | Admit: 2018-03-28 | Discharge: 2018-03-28 | Disposition: A | Discharge status: Home / Self Care | Payer: Medicare Other | Source: Ambulatory Visit | Attending: Family Medicine | Admitting: Family Medicine

## 2018-03-28 ENCOUNTER — Inpatient Hospital Stay: Payer: Medicare Other | Attending: Hematology and Oncology | Admitting: Hematology and Oncology

## 2018-03-28 ENCOUNTER — Other Ambulatory Visit: Payer: Self-pay | Admitting: *Deleted

## 2018-03-28 VITALS — BP 147/99 | HR 76 | Temp 96.2°F | Resp 18 | Wt 148.6 lb

## 2018-03-28 DIAGNOSIS — E2839 Other primary ovarian failure: Secondary | ICD-10-CM | POA: Diagnosis not present

## 2018-03-28 DIAGNOSIS — M8580 Other specified disorders of bone density and structure, unspecified site: Secondary | ICD-10-CM | POA: Diagnosis not present

## 2018-03-28 DIAGNOSIS — Z780 Asymptomatic menopausal state: Secondary | ICD-10-CM | POA: Diagnosis not present

## 2018-03-28 DIAGNOSIS — R220 Localized swelling, mass and lump, head: Secondary | ICD-10-CM | POA: Insufficient documentation

## 2018-03-28 DIAGNOSIS — C9110 Chronic lymphocytic leukemia of B-cell type not having achieved remission: Secondary | ICD-10-CM

## 2018-03-28 DIAGNOSIS — M85851 Other specified disorders of bone density and structure, right thigh: Secondary | ICD-10-CM | POA: Diagnosis not present

## 2018-03-28 NOTE — Progress Notes (Signed)
Patient here today for follow up visit.  Had bloodwork with PCP last week.  States her saliva glands are swollen. More on the left side.

## 2018-04-05 ENCOUNTER — Encounter: Payer: Self-pay | Admitting: *Deleted

## 2018-05-09 DIAGNOSIS — Z23 Encounter for immunization: Secondary | ICD-10-CM | POA: Diagnosis not present

## 2018-06-03 ENCOUNTER — Telehealth: Payer: Self-pay | Admitting: *Deleted

## 2018-06-03 NOTE — Telephone Encounter (Signed)
Pt states she received new orthotics 02/2018, and had problem with her hip and back pain, she tried them again and she is still having pain, what is she to do.

## 2018-06-08 NOTE — Telephone Encounter (Signed)
Returned patient call, left voice mail that if she is still having trouble with her orthotics to call back and schedule appt with Liliane Channel to get them adjusted.

## 2018-06-13 ENCOUNTER — Other Ambulatory Visit: Payer: Medicare Other

## 2018-06-13 ENCOUNTER — Ambulatory Visit: Payer: Medicare Other | Admitting: Hematology and Oncology

## 2018-06-13 DIAGNOSIS — H40003 Preglaucoma, unspecified, bilateral: Secondary | ICD-10-CM | POA: Diagnosis not present

## 2018-06-19 ENCOUNTER — Encounter: Payer: Self-pay | Admitting: Hematology and Oncology

## 2018-06-20 DIAGNOSIS — H40003 Preglaucoma, unspecified, bilateral: Secondary | ICD-10-CM | POA: Diagnosis not present

## 2018-07-12 ENCOUNTER — Encounter: Payer: Self-pay | Admitting: Family Medicine

## 2018-07-13 MED ORDER — ALPRAZOLAM 0.5 MG PO TABS: 0.5000 mg | ORAL_TABLET | Freq: Every day | ORAL | 3 refills | Status: DC | PRN

## 2018-07-13 NOTE — Telephone Encounter (Signed)
CPE scheduled on 03/08/19, last filled on 03/01/17 #30 tabs with 3 refills, last UDS 08/06/15

## 2018-07-20 ENCOUNTER — Other Ambulatory Visit: Payer: Medicare Other | Admitting: Orthotics

## 2018-08-23 ENCOUNTER — Other Ambulatory Visit: Payer: Self-pay | Admitting: Family Medicine

## 2018-08-23 DIAGNOSIS — Z1231 Encounter for screening mammogram for malignant neoplasm of breast: Secondary | ICD-10-CM

## 2018-09-05 ENCOUNTER — Ambulatory Visit: Payer: Medicare Other | Admitting: Hematology and Oncology

## 2018-09-05 ENCOUNTER — Other Ambulatory Visit: Payer: Medicare Other

## 2018-09-05 ENCOUNTER — Inpatient Hospital Stay: Payer: Medicare Other | Attending: Oncology

## 2018-09-05 ENCOUNTER — Encounter: Payer: Self-pay | Admitting: Oncology

## 2018-09-05 ENCOUNTER — Inpatient Hospital Stay (HOSPITAL_BASED_OUTPATIENT_CLINIC_OR_DEPARTMENT_OTHER): Payer: Medicare Other | Admitting: Oncology

## 2018-09-05 VITALS — BP 143/93 | HR 64 | Temp 98.0°F | Resp 18 | Wt 152.0 lb

## 2018-09-05 DIAGNOSIS — R590 Localized enlarged lymph nodes: Secondary | ICD-10-CM | POA: Diagnosis not present

## 2018-09-05 DIAGNOSIS — R5383 Other fatigue: Secondary | ICD-10-CM

## 2018-09-05 DIAGNOSIS — Z8052 Family history of malignant neoplasm of bladder: Secondary | ICD-10-CM

## 2018-09-05 DIAGNOSIS — I10 Essential (primary) hypertension: Secondary | ICD-10-CM | POA: Insufficient documentation

## 2018-09-05 DIAGNOSIS — Z79899 Other long term (current) drug therapy: Secondary | ICD-10-CM | POA: Insufficient documentation

## 2018-09-05 DIAGNOSIS — E785 Hyperlipidemia, unspecified: Secondary | ICD-10-CM

## 2018-09-05 DIAGNOSIS — R531 Weakness: Secondary | ICD-10-CM | POA: Diagnosis not present

## 2018-09-05 DIAGNOSIS — C8300 Small cell B-cell lymphoma, unspecified site: Secondary | ICD-10-CM | POA: Diagnosis not present

## 2018-09-05 DIAGNOSIS — E042 Nontoxic multinodular goiter: Secondary | ICD-10-CM

## 2018-09-05 DIAGNOSIS — F329 Major depressive disorder, single episode, unspecified: Secondary | ICD-10-CM | POA: Diagnosis not present

## 2018-09-05 DIAGNOSIS — C9110 Chronic lymphocytic leukemia of B-cell type not having achieved remission: Secondary | ICD-10-CM

## 2018-09-05 LAB — COMPREHENSIVE METABOLIC PANEL
ALT: 33 U/L (ref 0–44)
AST: 29 U/L (ref 15–41)
Albumin: 4.5 g/dL (ref 3.5–5.0)
Alkaline Phosphatase: 48 U/L (ref 38–126)
Anion gap: 7 (ref 5–15)
BUN: 24 mg/dL — ABNORMAL HIGH (ref 8–23)
CO2: 28 mmol/L (ref 22–32)
Calcium: 9 mg/dL (ref 8.9–10.3)
Chloride: 104 mmol/L (ref 98–111)
Creatinine, Ser: 0.68 mg/dL (ref 0.44–1.00)
GFR calc Af Amer: >60 mL/min (ref >60)
GFR calc non Af Amer: >60 mL/min (ref >60)
Glucose, Bld: 102 mg/dL — ABNORMAL HIGH (ref 70–99)
Potassium: 4 mmol/L (ref 3.5–5.1)
Sodium: 139 mmol/L (ref 135–145)
Total Bilirubin: 0.7 mg/dL (ref 0.3–1.2)
Total Protein: 7.5 g/dL (ref 6.5–8.1)

## 2018-09-05 LAB — CBC WITH DIFFERENTIAL/PLATELET
Abs Immature Granulocytes: 0.01 10*3/uL (ref 0.00–0.07)
Basophils Absolute: 0.1 10*3/uL (ref 0.0–0.1)
Basophils Relative: 1 %
Eosinophils Absolute: 0.3 10*3/uL (ref 0.0–0.5)
Eosinophils Relative: 3 %
HCT: 42.1 % (ref 36.0–46.0)
Hemoglobin: 13.3 g/dL (ref 12.0–15.0)
Immature Granulocytes: 0 %
Lymphocytes Relative: 40 %
Lymphs Abs: 3.4 10*3/uL (ref 0.7–4.0)
MCH: 28.5 pg (ref 26.0–34.0)
MCHC: 31.6 g/dL (ref 30.0–36.0)
MCV: 90.1 fL (ref 80.0–100.0)
Monocytes Absolute: 0.8 10*3/uL (ref 0.1–1.0)
Monocytes Relative: 9 %
Neutro Abs: 4.1 10*3/uL (ref 1.7–7.7)
Neutrophils Relative %: 47 %
Platelets: 258 10*3/uL (ref 150–400)
RBC: 4.67 MIL/uL (ref 3.87–5.11)
RDW: 12.2 % (ref 11.5–15.5)
WBC: 8.6 10*3/uL (ref 4.0–10.5)
nRBC: 0 % (ref 0.0–0.2)

## 2018-09-05 LAB — URIC ACID: Uric Acid, Serum: 4.6 mg/dL (ref 2.5–7.1)

## 2018-09-05 LAB — LACTATE DEHYDROGENASE: LDH: 127 U/L (ref 98–192)

## 2018-09-05 NOTE — Progress Notes (Signed)
Chronic Bilateral hip and low back pain. Years of wear and tear. Appetite good. Energy is fair. Has overheating at night , her norm. Denies fevers1 active lymph node in L neck. Switching from Dr Mike Gip to Dr Janese Banks.

## 2018-09-06 NOTE — Progress Notes (Signed)
Hematology/Oncology Consult note [facility]  Telephone:([phone] [phone]  Patient Care Team: Abner Greenspan, MD as PCP - General   Name of the patient: Anita Buckley  [id number]  [birth year]   Date of visit: 09/06/18  Diagnosis- SLL involving cervical lymph node currently under observation  Chief complaint/ Reason for visit-routine follow-up of SLL  Heme/Onc history: Patient is a 68 year old female who was diagnosed with SLL back in 2015.  She was having some problems with her thyroid gland back then an ultrasound of the soft tissue head and neck in September 2015 revealed homogeneous thyroid gland with several thyroid nodules.  Several enlarged left-sided neck lymph nodes.  FNA of the left neck no was consistent with clonal population of lymphocytes CD5 positive, CD23 positive and CD43 8+.  It was negative for malignant cells.  She also had a PET scan subsequently and her last PET scan was in 2016 which showed stable mild metabolism within a 1.2 cm level 3 lymph node but no evidence of hypermetabolism or significant adenopathy elsewhere.  CT chest in February 2018 revealed mild thoracic lymphadenopathy compatible with CLL.  She has not had any peripheral blood lymphocytosis.  Interval history-currently she is doing well with her health and reports her appetite is good she denies any unintentional weight loss.  She denies overwhelming fatigue but does have mild chronic fatigue or denies drenching night sweats.  She does admit to having stressors in her life but denies other complaints  ECOG PS- 1 Pain scale- 0   Review of systems- Review of Systems  Constitutional: Positive for malaise/fatigue. Negative for chills, fever and weight loss.  HENT: Negative for congestion, ear discharge and nosebleeds.   Eyes: Negative for blurred vision.  Respiratory: Negative for cough, hemoptysis, sputum production, shortness of breath and wheezing.     Cardiovascular: Negative for chest pain, palpitations, orthopnea and claudication.  Gastrointestinal: Negative for abdominal pain, blood in stool, constipation, diarrhea, heartburn, melena, nausea and vomiting.  Genitourinary: Negative for dysuria, flank pain, frequency, hematuria and urgency.  Musculoskeletal: Negative for back pain, joint pain and myalgias.  Skin: Negative for rash.  Neurological: Negative for dizziness, tingling, focal weakness, seizures, weakness and headaches.  Endo/Heme/Allergies: Does not bruise/bleed easily.  Psychiatric/Behavioral: Negative for depression and suicidal ideas. The patient does not have insomnia.      Allergies  Allergen Reactions  . Bee Venom     Anaphylaxis   . Buspirone Hcl     REACTION: did not like  . Minocycline Nausea And Vomiting  . Penicillins     REACTION: reaction as a child     Past Medical History:  Diagnosis Date  . Anxiety   . Cancer ([place])    B CELL LYMPHOMA  . Chronic lymphocytic leukemia (CLL), B-cell ([place]) 02/11/2016  . Depression   . History of anxiety   . History of depression   . History of fibrocystic disease of breast   . History of hyperlipidemia   . Hypertension   . Situational stress      Past Surgical History:  Procedure Laterality Date  . COLONOSCOPY  12/2005   polyps, tics  . COLONOSCOPY WITH PROPOFOL N/A 05/11/2016   Procedure: COLONOSCOPY WITH PROPOFOL;  Surgeon: Manya Silvas, MD;  Location: [facility] ENDOSCOPY;  Service: Endoscopy;  Laterality: N/A;  . THYROIDECTOMY Left 08/20/2015   Procedure: THYROIDECTOMY/ HEMITHYROIDECTOMY;  Surgeon: Beverly Gust, MD;  Location: [facility] ORS;  Service: ENT;  Laterality: Left;  .  TUBAL LIGATION      Social History   Socioeconomic History  . Marital status: Married    Spouse name: Not on file  . Number of children: Not on file  . Years of education: Not on file  . Highest education level: Not on file  Occupational History  . Not on file  Social Needs  .  Financial resource strain: Not on file  . Food insecurity:    Worry: Not on file    Inability: Not on file  . Transportation needs:    Medical: Not on file    Non-medical: Not on file  Tobacco Use  . Smoking status: Never Smoker  . Smokeless tobacco: Never Used  Substance and Sexual Activity  . Alcohol use: Yes    Alcohol/week: 0.0 standard drinks    Comment: occ (wine with dinner)  . Drug use: No  . Sexual activity: Not on file  Lifestyle  . Physical activity:    Days per week: Not on file    Minutes per session: Not on file  . Stress: Not on file  Relationships  . Social connections:    Talks on phone: Not on file    Gets together: Not on file    Attends religious service: Not on file    Active member of club or organization: Not on file    Attends meetings of clubs or organizations: Not on file    Relationship status: Not on file  . Intimate partner violence:    Fear of current or ex partner: Not on file    Emotionally abused: Not on file    Physically abused: Not on file    Forced sexual activity: Not on file  Other Topics Concern  . Not on file  Social History Narrative  . Not on file    Family History  Problem Relation Age of Onset  . Diabetes Father        late in life  . Alcohol abuse Father   . Hyperlipidemia Mother   . Mental illness Mother   . Depression Sister   . Leukemia Brother   . Bladder Cancer Brother   . Schizophrenia Other        Grandfather  . Breast cancer Neg Hx      Current Outpatient Medications:  .  alendronate (FOSAMAX) 70 MG tablet, TAKE ONE TABLET BY MOUTH ONCE A WEEK WITH A FULL GLASS OF WATER ON AN EMPTY STOMACH, Disp: 12 tablet, Rfl: 3 .  ALPRAZolam (XANAX) 0.5 MG tablet, Take 1 tablet (0.5 mg total) by mouth daily as needed for anxiety., Disp: 30 tablet, Rfl: 3 .  amLODipine (NORVASC) 5 MG tablet, Take 1 tablet (5 mg total) by mouth daily., Disp: 90 tablet, Rfl: 3 .  Calcium Citrate-Vitamin D (CITRACAL + D PO), Take 1-2  capsules by mouth daily. , Disp: , Rfl:  .  latanoprost (XALATAN) 0.005 % ophthalmic solution, Place 1 drop into both eyes at bedtime., Disp: , Rfl:  .  EPINEPHRINE 0.3 mg/0.3 mL IJ SOAJ injection, INJECT 0.3 ML INTO MUSCLE AS NEEDED (Patient not taking: Reported on 09/05/2018), Disp: 2 Device, Rfl: 2 .  famotidine (PEPCID) 10 MG tablet, Take 1 tablet (10 mg total) by mouth daily. (Patient not taking: Reported on 09/05/2018), Disp: , Rfl:  .  Loratadine 10 MG CAPS, Take 1 capsule (10 mg total) by mouth daily. (Patient not taking: Reported on 09/05/2018), Disp: 30 each, Rfl:   Physical exam:  Vitals:  09/05/18 1018  BP: (!) 143/93  Pulse: 64  Resp: 18  Temp: 98 F (36.7 C)  TempSrc: Oral  Weight: 152 lb (68.9 kg)   Physical Exam HENT:     Head: Normocephalic and atraumatic.  Eyes:     Pupils: Pupils are equal, round, and reactive to light.  Neck:     Musculoskeletal: Normal range of motion.  Cardiovascular:     Rate and Rhythm: Normal rate and regular rhythm.     Heart sounds: Normal heart sounds.  Pulmonary:     Effort: Pulmonary effort is normal.     Breath sounds: Normal breath sounds.  Abdominal:     General: Bowel sounds are normal. There is no distension.     Palpations: Abdomen is soft.     Tenderness: There is no abdominal tenderness.     Comments: No palpable splenomegaly  Lymphadenopathy:     Comments: No palpable cervical, supraclavicular, axillary or inguinal adenopathy   Skin:    General: Skin is warm and dry.  Neurological:     Mental Status: She is alert and oriented to person, place, and time.      CMP Latest Ref Rng & Units 09/05/2018  Glucose 70 - 99 mg/dL 102(H)  BUN 8 - 23 mg/dL 24(H)  Creatinine 0.44 - 1.00 mg/dL 0.68  Sodium 135 - 145 mmol/L 139  Potassium 3.5 - 5.1 mmol/L 4.0  Chloride 98 - 111 mmol/L 104  CO2 22 - 32 mmol/L 28  Calcium 8.9 - 10.3 mg/dL 9.0  Total Protein 6.5 - 8.1 g/dL 7.5  Total Bilirubin 0.3 - 1.2 mg/dL 0.7  Alkaline Phos  38 - 126 U/L 48  AST 15 - 41 U/L 29  ALT 0 - 44 U/L 33   CBC Latest Ref Rng & Units 09/05/2018  WBC 4.0 - 10.5 K/uL 8.6  Hemoglobin 12.0 - 15.0 g/dL 13.3  Hematocrit 36.0 - 46.0 % 42.1  Platelets 150 - 400 K/uL 258      Assessment and plan- Patient is a 68 y.o. female with SLL diagnosed on FNA of the left cervical lymph node that was found incidentally for evaluation of thyroid nodule  Patient has not had any peripheral blood lymphocytosis and her CBC is essentially normal.  She was only found to have clonal lymphocytosis involving her left cervical lymph node and the basis of her last PET scan in 2016 the only area of hypermetabolism was Level 3's left cervical lymph node but no hypermetabolic them elsewhere.  She therefore has SLL.  Given her stability of counts and lack of B symptoms over the last 5 years she does not require any hematology follow-up at this time.  She can continue to follow-up with her primary care doctor and get a yearly CBC with differential along with attention to any specific B symptoms and routine lymph node exam once a year.  If she were to develop any worsening B symptoms, cytopenias or worsening lymphocytosis she can be referred to [place] in the future   Visit Diagnosis 1. Lymphoma, small lymphocytic ([place])      Dr. Randa Evens, MD, MPH [facility] [phone] 09/06/2018 8:39 AM

## 2018-09-09 ENCOUNTER — Ambulatory Visit
Admission: RE | Admit: 2018-09-09 | Discharge: 2018-09-09 | Disposition: A | Discharge status: Home / Self Care | Payer: Medicare Other | Source: Ambulatory Visit | Attending: Family Medicine | Admitting: Family Medicine

## 2018-09-09 DIAGNOSIS — Z1231 Encounter for screening mammogram for malignant neoplasm of breast: Secondary | ICD-10-CM | POA: Diagnosis not present

## 2018-10-03 ENCOUNTER — Other Ambulatory Visit: Payer: Medicare Other

## 2018-10-03 ENCOUNTER — Ambulatory Visit: Payer: Medicare Other | Admitting: Hematology and Oncology

## 2018-10-04 ENCOUNTER — Encounter: Payer: Self-pay | Admitting: Family Medicine

## 2018-10-04 MED ORDER — MELOXICAM 7.5 MG PO TABS: 7.5000 mg | ORAL_TABLET | Freq: Two times a day (BID) | ORAL | 3 refills | Status: DC | PRN

## 2018-10-04 NOTE — Telephone Encounter (Signed)
meloxicam for bursitis

## 2018-11-05 ENCOUNTER — Encounter: Payer: Self-pay | Admitting: Family Medicine

## 2018-11-07 MED ORDER — ALPRAZOLAM 0.5 MG PO TABS: 0.5000 mg | ORAL_TABLET | Freq: Every day | ORAL | 3 refills | Status: DC | PRN

## 2019-03-05 ENCOUNTER — Other Ambulatory Visit: Payer: Self-pay | Admitting: Family Medicine

## 2019-03-08 ENCOUNTER — Encounter: Payer: Medicare Other | Admitting: Family Medicine

## 2019-03-08 ENCOUNTER — Ambulatory Visit: Payer: Medicare Other

## 2019-04-04 ENCOUNTER — Encounter: Payer: Self-pay | Admitting: Family Medicine

## 2019-04-04 ENCOUNTER — Encounter: Payer: Medicare Other | Admitting: Family Medicine

## 2019-04-04 ENCOUNTER — Ambulatory Visit (INDEPENDENT_AMBULATORY_CARE_PROVIDER_SITE_OTHER): Payer: Medicare Other | Admitting: Family Medicine

## 2019-04-04 VITALS — Ht 65.5 in | Wt 151.0 lb

## 2019-04-04 DIAGNOSIS — I10 Essential (primary) hypertension: Secondary | ICD-10-CM | POA: Diagnosis not present

## 2019-04-04 DIAGNOSIS — R7303 Prediabetes: Secondary | ICD-10-CM | POA: Diagnosis not present

## 2019-04-04 DIAGNOSIS — M8580 Other specified disorders of bone density and structure, unspecified site: Secondary | ICD-10-CM

## 2019-04-04 DIAGNOSIS — C9110 Chronic lymphocytic leukemia of B-cell type not having achieved remission: Secondary | ICD-10-CM | POA: Diagnosis not present

## 2019-04-04 DIAGNOSIS — E7800 Pure hypercholesterolemia, unspecified: Secondary | ICD-10-CM | POA: Diagnosis not present

## 2019-04-04 MED ORDER — EPINEPHRINE 0.3 MG/0.3ML IJ SOAJ: INTRAMUSCULAR | 3 refills | Status: DC

## 2019-04-04 MED ORDER — AMLODIPINE BESYLATE 5 MG PO TABS: 5.0000 mg | ORAL_TABLET | Freq: Every day | ORAL | 3 refills | Status: DC

## 2019-04-04 MED ORDER — ALENDRONATE SODIUM 70 MG PO TABS: ORAL_TABLET | ORAL | 3 refills | Status: DC

## 2019-04-04 NOTE — Patient Instructions (Addendum)
Get your flu shot and pneumovax 23 this fall   When you feel comfortable coming in for labs let me know  Try to exercise regularly  Eat a healthy low sugar and low fat diet  When you can, check a blood pressure and call or email [place] with the result

## 2019-04-04 NOTE — Assessment & Plan Note (Signed)
Due for labs Will call when ready to come in  Lab Results  Component Value Date   HGBA1C 5.8 03/01/2018   disc imp of low glycemic diet and wt loss to prevent DM2

## 2019-04-04 NOTE — Assessment & Plan Note (Signed)
dexa 8/19 -utd  Taking fosamax w/o problems  Also ca and D No falls or fx Dexa due in a year

## 2019-04-04 NOTE — Progress Notes (Signed)
Virtual Visit via Video Note  I connected with Anita Buckley on 04/04/19 at  2:00 PM EDT by a video enabled telemedicine application and verified that I am speaking with the correct person using two identifiers.  Location: Patient: home Provider: office    I discussed the limitations of evaluation and management by telemedicine and the availability of in person appointments. The patient expressed understanding and agreed to proceed.  History of Present Illness: Pt presents for annual f/u of chronic medical problems   Staying home during the pandemic  Feeling ok  Taking care of herself  Cares for husband with Parkinsons   Due for pna vaccine 23  Flu shot-every fall  She does her shots at a small pharmacy in [place] 1/20 Self breast exam - no lumps    Tdap 5/15 Zoster status -zostavax 10/08 Interested in shingrix - but may have had it -she will double check   Colonoscopy 9/17   Weight  Wt Readings from Last 3 Encounters:  04/04/19 151 lb (68.5 kg)  09/05/18 152 lb (68.9 kg)  03/28/18 148 lb 9 oz (67.4 kg)  wt is stable  Does some video exercise  Eats healthy as well  24.75 kg/m   Hearing- no problems  Vision - no problems / she keeps up with eye exams -due for exam soon  Pre glaucoma  Osteopenia  dexa 8/19 some improvement  On fosamax -no problems  Falls-none   Fractures-none  Supplements - takes D and ca  Exercise-some at home   H/o CLL Heme/onc signed off to get cbc here yearly (Dr Janese Banks) Lab Results  Component Value Date   WBC 8.6 09/05/2018   HGB 13.3 09/05/2018   HCT 42.1 09/05/2018   MCV 90.1 09/05/2018   PLT 258 09/05/2018    No fever No infections or LNs No bleeding    Mood (depression/anx) Doing well/mood is pretty good   bp is stable today  She has not checked her bp  No cp or palpitations or headaches or edema  No side effects to medicines  BP Readings from Last 3 Encounters:  09/05/18 (!) 143/93  03/28/18 (!)  147/99  03/01/18 122/80       Prediabetes Lab Results  Component Value Date   HGBA1C 5.8 03/01/2018  due for labs  Hyperlipidemia Lab Results  Component Value Date   CHOL 214 (H) 03/01/2018   HDL 64.30 03/01/2018   LDLCALC 127 (H) 03/01/2018   LDLDIRECT 126.4 06/28/2012   TRIG 115.0 03/01/2018   CHOLHDL 3 03/01/2018   Due for labs  She eats what she wants  Not fried food or fast food No red meat   Patient Active Problem List   Diagnosis Date Noted  . Welcome to Medicare preventive visit 03/01/2018  . Tendinitis of hand 02/08/2018  . Encounter for annual routine gynecological examination 03/01/2017  . Chronic lymphocytic leukemia (CLL), B-cell ([place]) 02/11/2016  . Colon cancer screening 01/25/2015  . Estrogen deficiency 01/25/2015  . Lymphoma ([place]) 06/04/2014  . Osteopenia 01/06/2013  . HTN (hypertension), benign 06/28/2012  . Other screening mammogram 01/05/2012  . Gynecological examination 01/05/2012  . Post-menopause 01/05/2012  . Routine general medical examination at a health care facility 12/31/2011  . PURE HYPERCHOLESTEROLEMIA 08/15/2008  . ANXIETY 12/08/2007  . DEPRESSION 03/30/2007  . FIBROCYSTIC BREAST DISEASE 03/30/2007  . Prediabetes 03/30/2007   Past Medical History:  Diagnosis Date  . Anxiety   . Cancer ([place])    B CELL  LYMPHOMA  . Chronic lymphocytic leukemia (CLL), B-cell ([place]) 02/11/2016  . Depression   . History of anxiety   . History of depression   . History of fibrocystic disease of breast   . History of hyperlipidemia   . Hypertension   . Situational stress    Past Surgical History:  Procedure Laterality Date  . COLONOSCOPY  12/2005   polyps, tics  . COLONOSCOPY WITH PROPOFOL N/A 05/11/2016   Procedure: COLONOSCOPY WITH PROPOFOL;  Surgeon: Manya Silvas, MD;  Location[facility] ENDOSCOPY;  Service: Endoscopy;  Laterality: N/A;  . THYROIDECTOMY Left 08/20/2015   Procedure: THYROIDECTOMY/ HEMITHYROIDECTOMY;  Surgeon: Beverly Gust, MD;   Location: [facility] ORS;  Service: ENT;  Laterality: Left;  . TUBAL LIGATION     Social History   Tobacco Use  . Smoking status: Never Smoker  . Smokeless tobacco: Never Used  Substance Use Topics  . Alcohol use: Yes    Alcohol/week: 0.0 standard drinks    Comment: occ (wine with dinner)  . Drug use: No   Family History  Problem Relation Age of Onset  . Diabetes Father        late in life  . Alcohol abuse Father   . Hyperlipidemia Mother   . Mental illness Mother   . Depression Sister   . Leukemia Brother   . Bladder Cancer Brother   . Schizophrenia Other        Grandfather  . Breast cancer Neg Hx    Allergies  Allergen Reactions  . Bee Venom     Anaphylaxis   . Buspirone Hcl     REACTION: did not like  . Minocycline Nausea And Vomiting  . Penicillins     REACTION: reaction as a child   Current Outpatient Medications on File Prior to Visit  Medication Sig Dispense Refill  . ALPRAZolam (XANAX) 0.5 MG tablet Take 1 tablet (0.5 mg total) by mouth daily as needed for anxiety. 30 tablet 3  . Calcium Citrate-Vitamin D (CITRACAL + D PO) Take 1-2 capsules by mouth daily.     Marland Kitchen EPINEPHRINE 0.3 mg/0.3 mL IJ SOAJ injection INJECT 0.3 ML INTO MUSCLE AS NEEDED 2 Device 2  . famotidine (PEPCID) 10 MG tablet Take 1 tablet (10 mg total) by mouth daily.    Marland Kitchen latanoprost (XALATAN) 0.005 % ophthalmic solution Place 1 drop into both eyes at bedtime.    . Loratadine 10 MG CAPS Take 1 capsule (10 mg total) by mouth daily. 30 each   . meloxicam (MOBIC) 7.5 MG tablet Take 7.5 mg by mouth 2 (two) times daily as needed for pain.     No current facility-administered medications on file prior to visit.     Review of Systems  Constitutional: Negative for chills, fever, malaise/fatigue and weight loss.  Eyes: Negative for blurred vision, discharge and redness.       Watching eye pressure  Respiratory: Negative for cough and shortness of breath.   Cardiovascular: Negative for chest pain,  palpitations and leg swelling.  Gastrointestinal: Negative for blood in stool, heartburn and nausea.  Musculoskeletal: Negative for myalgias and neck pain.  Skin: Negative for rash.  Neurological: Negative for dizziness and headaches.  Endo/Heme/Allergies: Negative for polydipsia.  Psychiatric/Behavioral: Negative for depression. The patient is not nervous/anxious.      Observations/Objective: Patient appears well, in no distress Weight is baseline (normal) No facial swelling or asymmetry Normal voice-not hoarse and no slurred speech No obvious tremor or mobility impairment Moving neck and  UEs normally Able to hear the call well  No cough or shortness of breath during interview  Talkative and mentally sharp with no cognitive changes No skin changes on face or neck , no rash or pallor Affect is normal (cheerful/talkative)   Assessment and Plan: Problem List Items Addressed This Visit      Cardiovascular and Mediastinum   HTN (hypertension), benign - Primary    She will check a bp at home and email [place] with results  Has been fairly well controlled Labs reviewed  Amlodipine refilled      Relevant Medications   amLODipine (NORVASC) 5 MG tablet     Musculoskeletal and Integument   Osteopenia    dexa 8/19 -utd  Taking fosamax w/o problems  Also ca and D No falls or fx Dexa due in a year        Other   PURE HYPERCHOLESTEROLEMIA    Due for lipid check  Diet is very good Last check high HDL and LDL of 127  Disc goals for lipids and reasons to control them Rev last labs with pt Rev low sat fat diet in detail  Will call when ready to schedule labs       Relevant Medications   amLODipine (NORVASC) 5 MG tablet   Prediabetes    Due for labs Will call when ready to come in  Lab Results  Component Value Date   HGBA1C 5.8 03/01/2018   disc imp of low glycemic diet and wt loss to prevent DM2       Chronic lymphocytic leukemia (CLL), B-cell ([place])    Heme/onc signed  off for now Want to check cbc yearly (due in January) No symptoms       Relevant Medications   meloxicam (MOBIC) 7.5 MG tablet       Follow Up Instructions: Get your flu shot and pneumovax 23 this fall   When you feel comfortable coming in for labs let me know  Try to exercise regularly  Eat a healthy low sugar and low fat diet  When you can, check a blood pressure and call or email [place] with the result    I discussed the assessment and treatment plan with the patient. The patient was provided an opportunity to ask questions and all were answered. The patient agreed with the plan and demonstrated an understanding of the instructions.   The patient was advised to call back or seek an in-person evaluation if the symptoms worsen or if the condition fails to improve as anticipated.   Loura Pardon, MD

## 2019-04-04 NOTE — Assessment & Plan Note (Signed)
She will check a bp at home and email [place] with results  Has been fairly well controlled Labs reviewed  Amlodipine refilled

## 2019-04-04 NOTE — Assessment & Plan Note (Signed)
Heme/onc signed off for now Want to check cbc yearly (due in January) No symptoms

## 2019-04-04 NOTE — Assessment & Plan Note (Signed)
Due for lipid check  Diet is very good Last check high HDL and LDL of 127  Disc goals for lipids and reasons to control them Rev last labs with pt Rev low sat fat diet in detail  Will call when ready to schedule labs

## 2019-04-28 DIAGNOSIS — Z23 Encounter for immunization: Secondary | ICD-10-CM | POA: Diagnosis not present

## 2019-05-12 ENCOUNTER — Encounter: Payer: Self-pay | Admitting: Family Medicine

## 2019-05-12 ENCOUNTER — Ambulatory Visit (INDEPENDENT_AMBULATORY_CARE_PROVIDER_SITE_OTHER): Payer: Medicare Other | Admitting: Family Medicine

## 2019-05-12 ENCOUNTER — Telehealth: Payer: Self-pay

## 2019-05-12 DIAGNOSIS — J029 Acute pharyngitis, unspecified: Secondary | ICD-10-CM | POA: Diagnosis not present

## 2019-05-12 MED ORDER — AZITHROMYCIN 250 MG PO TABS: ORAL_TABLET | ORAL | 0 refills | Status: DC

## 2019-05-12 NOTE — Telephone Encounter (Signed)
Pt said had pneumovax 23 on 04/28/19 at [place] (immunization list updated) and on 05/08/19 developed S/T; today S/T hurting a lot more.some swelling in throat but no difficulty swallowing liquid and no difficulty breathing. Pt thinks side effect to pneumovax. Pt request abx to [facility]. Pt has virtual appt with Dr Glori Bickers today at 11 AM. ED precautions given and pt voiced understanding. Pt will have vitals ready when CMA calls.Pt has no covid symptoms except S/T, no travel and no known exposure to + covid.

## 2019-05-12 NOTE — Progress Notes (Signed)
Virtual Visit via Video Note  I connected with Anita Buckley on 05/12/19 at 11:00 AM EDT by a video enabled telemedicine application and verified that I am speaking with the correct person using two identifiers.  Location: Patient: home Provider: office    I discussed the limitations of evaluation and management by telemedicine and the availability of in person appointments. The patient expressed understanding and agreed to proceed.  History of Present Illness: Presents for reaction to PPSV 23 vaccine (possible) Also sore throat   Had vaccine on 04/28/19 at [place]   9/21 developed ST  Yesterday afternoon worse and worse today  No sick contacts   Constant and sharp  Hurts to swallow (can swallow)  A little painful to talk  Has been drinking fluids - lots  No LN swelling more than usual  A little tender on L  Has not looked at her throat   No fever or chills or body aches Is tired  No rash   No nasal symptoms at all  No drip  No cough at all No sob Ears feel fine   No change in taste or smell   Doing salt water gargle  400 mg ibuprofen helps some   No hives or tongue swelling   Patient Active Problem List   Diagnosis Date Noted  . Pharyngitis 05/12/2019  . Welcome to Medicare preventive visit 03/01/2018  . Tendinitis of hand 02/08/2018  . Encounter for annual routine gynecological examination 03/01/2017  . Chronic lymphocytic leukemia (CLL), B-cell ([place]) 02/11/2016  . Colon cancer screening 01/25/2015  . Estrogen deficiency 01/25/2015  . Lymphoma ([place]) 06/04/2014  . Osteopenia 01/06/2013  . HTN (hypertension), benign 06/28/2012  . Other screening mammogram 01/05/2012  . Gynecological examination 01/05/2012  . Post-menopause 01/05/2012  . Routine general medical examination at a health care facility 12/31/2011  . PURE HYPERCHOLESTEROLEMIA 08/15/2008  . ANXIETY 12/08/2007  . FIBROCYSTIC BREAST DISEASE 03/30/2007  . Prediabetes 03/30/2007    Past Medical History:  Diagnosis Date  . Anxiety   . Cancer ([place])    B CELL LYMPHOMA  . Chronic lymphocytic leukemia (CLL), B-cell ([place]) 02/11/2016  . Depression   . History of anxiety   . History of depression   . History of fibrocystic disease of breast   . History of hyperlipidemia   . Hypertension   . Situational stress    Past Surgical History:  Procedure Laterality Date  . COLONOSCOPY  12/2005   polyps, tics  . COLONOSCOPY WITH PROPOFOL N/A 05/11/2016   Procedure: COLONOSCOPY WITH PROPOFOL;  Surgeon: Manya Silvas, MD;  Location: [facility] ENDOSCOPY;  Service: Endoscopy;  Laterality: N/A;  . THYROIDECTOMY Left 08/20/2015   Procedure: THYROIDECTOMY/ HEMITHYROIDECTOMY;  Surgeon: Beverly Gust, MD;  Location: [facility] ORS;  Service: ENT;  Laterality: Left;  . TUBAL LIGATION     Social History   Tobacco Use  . Smoking status: Never Smoker  . Smokeless tobacco: Never Used  Substance Use Topics  . Alcohol use: Yes    Alcohol/week: 0.0 standard drinks    Comment: occ (wine with dinner)  . Drug use: No   Family History  Problem Relation Age of Onset  . Diabetes Father        late in life  . Alcohol abuse Father   . Hyperlipidemia Mother   . Mental illness Mother   . Depression Sister   . Leukemia Brother   . Bladder Cancer Brother   . Schizophrenia Other  Grandfather  . Breast cancer Neg Hx    Allergies  Allergen Reactions  . Bee Venom     Anaphylaxis   . Buspirone Hcl     REACTION: did not like  . Minocycline Nausea And Vomiting  . Penicillins     REACTION: reaction as a child   Current Outpatient Medications on File Prior to Visit  Medication Sig Dispense Refill  . alendronate (FOSAMAX) 70 MG tablet TAKE 1 TABLET BY MOUTH ONCE A WEEK WITH FULL GLASS OF WATER AND EMPTY STOMACH 12 tablet 3  . ALPRAZolam (XANAX) 0.5 MG tablet Take 1 tablet (0.5 mg total) by mouth daily as needed for anxiety. 30 tablet 3  . amLODipine (NORVASC) 5 MG tablet Take 1 tablet  (5 mg total) by mouth daily. 90 tablet 3  . Calcium Citrate-Vitamin D (CITRACAL + D PO) Take 1-2 capsules by mouth daily.     Marland Kitchen EPINEPHrine 0.3 mg/0.3 mL IJ SOAJ injection INJECT 0.3 ML INTO MUSCLE AS NEEDED 2 each 3  . famotidine (PEPCID) 10 MG tablet Take 1 tablet (10 mg total) by mouth daily.    Marland Kitchen latanoprost (XALATAN) 0.005 % ophthalmic solution Place 1 drop into both eyes at bedtime.    . Loratadine 10 MG CAPS Take 1 capsule (10 mg total) by mouth daily. 30 each   . meloxicam (MOBIC) 7.5 MG tablet Take 7.5 mg by mouth 2 (two) times daily as needed for pain.     No current facility-administered medications on file prior to visit.    Review of Systems  Constitutional: Positive for malaise/fatigue. Negative for chills, diaphoresis and fever.  HENT: Positive for sore throat. Negative for congestion, ear pain and sinus pain.   Eyes: Negative for discharge and redness.  Respiratory: Negative for cough, shortness of breath and wheezing.   Cardiovascular: Negative for chest pain and palpitations.  Gastrointestinal: Negative for abdominal pain, diarrhea, nausea and vomiting.  Musculoskeletal: Negative for myalgias.  Skin: Negative for itching and rash.  Neurological: Negative for dizziness and headaches.   Observations/Objective: Patient appears well, in no distress Weight is baseline  No facial swelling or asymmetry Speech is guarded due to ST/ but not hoarse  (does sound like throat is swollen) She is able to swallow without difficulty No obvious tremor or mobility impairment Moving neck and UEs normally Able to hear the call well  No cough or shortness of breath during interview  Talkative and mentally sharp with no cognitive changes No skin changes on face or neck , no rash or pallor Affect is normal    Assessment and Plan: Problem List Items Addressed This Visit      Respiratory   Pharyngitis    Without other symptoms  Suspect strep pharyngitis Px zpak (is pcn allergic)   Fluids/salt water gargles/analgesics  Chloraseptic throat spray prn  Update if not starting to improve in a week or if worsening   inst to watch for fever or other new symptoms Low threshold to test for covid if needed           Follow Up Instructions: Drink fluids and rest  Take zithromax as directed  Continue salt water gargle Analgesics aw needed  Chloraseptic throat products may help Update if not starting to improve in a week or if worsening   If new symptoms like fever or cough-call asap (would consider covid testing)    I discussed the assessment and treatment plan with the patient. The patient was provided an opportunity to  ask questions and all were answered. The patient agreed with the plan and demonstrated an understanding of the instructions.   The patient was advised to call back or seek an in-person evaluation if the symptoms worsen or if the condition fails to improve as anticipated.     Loura Pardon, MD

## 2019-05-13 NOTE — Patient Instructions (Signed)
Drink fluids and rest  Take zithromax as directed  Continue salt water gargle Analgesics aw needed  Chloraseptic throat products may help Update if not starting to improve in a week or if worsening   If new symptoms like fever or cough-call asap (would consider covid testing)

## 2019-05-13 NOTE — Assessment & Plan Note (Signed)
Without other symptoms  Suspect strep pharyngitis Px zpak (is pcn allergic)  Fluids/salt water gargles/analgesics  Chloraseptic throat spray prn  Update if not starting to improve in a week or if worsening   inst to watch for fever or other new symptoms Low threshold to test for covid if needed

## 2019-06-21 DIAGNOSIS — Z23 Encounter for immunization: Secondary | ICD-10-CM | POA: Diagnosis not present

## 2019-07-18 ENCOUNTER — Encounter: Payer: Self-pay | Admitting: Family Medicine

## 2019-07-18 MED ORDER — ALPRAZOLAM 0.5 MG PO TABS: 0.5000 mg | ORAL_TABLET | Freq: Every day | ORAL | 3 refills | Status: DC | PRN

## 2019-07-18 NOTE — Telephone Encounter (Signed)
Name of Medication: Xanax Name of Pharmacy: [facility] [address] Henrietta Dine or Written Date and Quantity: 11/07/18 #30 tabs with 3 refills Last Office Visit and Type: ST on 05/12/19 (CPE on 04/04/19) Next Office Visit and Type: none scheduled

## 2019-09-04 DIAGNOSIS — H40003 Preglaucoma, unspecified, bilateral: Secondary | ICD-10-CM | POA: Diagnosis not present

## 2019-09-05 DIAGNOSIS — Z20828 Contact with and (suspected) exposure to other viral communicable diseases: Secondary | ICD-10-CM | POA: Diagnosis not present

## 2019-09-13 ENCOUNTER — Encounter: Payer: Self-pay | Admitting: Family Medicine

## 2019-09-20 ENCOUNTER — Other Ambulatory Visit: Payer: Self-pay | Admitting: *Deleted

## 2019-09-20 DIAGNOSIS — C8300 Small cell B-cell lymphoma, unspecified site: Secondary | ICD-10-CM

## 2019-09-25 ENCOUNTER — Inpatient Hospital Stay: Payer: Medicare Other | Attending: Oncology

## 2019-09-25 ENCOUNTER — Other Ambulatory Visit: Payer: Self-pay

## 2019-09-25 ENCOUNTER — Inpatient Hospital Stay (HOSPITAL_BASED_OUTPATIENT_CLINIC_OR_DEPARTMENT_OTHER): Payer: Medicare Other | Admitting: Oncology

## 2019-09-25 ENCOUNTER — Encounter: Payer: Self-pay | Admitting: Oncology

## 2019-09-25 VITALS — BP 159/94 | HR 78 | Temp 96.5°F | Resp 16 | Wt 157.5 lb

## 2019-09-25 DIAGNOSIS — Z79899 Other long term (current) drug therapy: Secondary | ICD-10-CM | POA: Insufficient documentation

## 2019-09-25 DIAGNOSIS — C8300 Small cell B-cell lymphoma, unspecified site: Secondary | ICD-10-CM

## 2019-09-25 DIAGNOSIS — F418 Other specified anxiety disorders: Secondary | ICD-10-CM | POA: Diagnosis not present

## 2019-09-25 DIAGNOSIS — C9110 Chronic lymphocytic leukemia of B-cell type not having achieved remission: Secondary | ICD-10-CM | POA: Diagnosis not present

## 2019-09-25 LAB — COMPREHENSIVE METABOLIC PANEL
ALT: 56 U/L — ABNORMAL HIGH (ref 0–44)
AST: 35 U/L (ref 15–41)
Albumin: 4.6 g/dL (ref 3.5–5.0)
Alkaline Phosphatase: 47 U/L (ref 38–126)
Anion gap: 8 (ref 5–15)
BUN: 19 mg/dL (ref 8–23)
CO2: 25 mmol/L (ref 22–32)
Calcium: 9.1 mg/dL (ref 8.9–10.3)
Chloride: 104 mmol/L (ref 98–111)
Creatinine, Ser: 0.68 mg/dL (ref 0.44–1.00)
GFR calc Af Amer: >60 mL/min (ref >60)
GFR calc non Af Amer: >60 mL/min (ref >60)
Glucose, Bld: 94 mg/dL (ref 70–99)
Potassium: 3.7 mmol/L (ref 3.5–5.1)
Sodium: 137 mmol/L (ref 135–145)
Total Bilirubin: 0.6 mg/dL (ref 0.3–1.2)
Total Protein: 7.2 g/dL (ref 6.5–8.1)

## 2019-09-25 LAB — CBC WITH DIFFERENTIAL/PLATELET
Abs Immature Granulocytes: 0.02 10*3/uL (ref 0.00–0.07)
Basophils Absolute: 0.1 10*3/uL (ref 0.0–0.1)
Basophils Relative: 1 %
Eosinophils Absolute: 0.2 10*3/uL (ref 0.0–0.5)
Eosinophils Relative: 2 %
HCT: 40.9 % (ref 36.0–46.0)
Hemoglobin: 12.7 g/dL (ref 12.0–15.0)
Immature Granulocytes: 0 %
Lymphocytes Relative: 47 %
Lymphs Abs: 4.7 10*3/uL — ABNORMAL HIGH (ref 0.7–4.0)
MCH: 28.4 pg (ref 26.0–34.0)
MCHC: 31.1 g/dL (ref 30.0–36.0)
MCV: 91.5 fL (ref 80.0–100.0)
Monocytes Absolute: 0.9 10*3/uL (ref 0.1–1.0)
Monocytes Relative: 9 %
Neutro Abs: 4.1 10*3/uL (ref 1.7–7.7)
Neutrophils Relative %: 41 %
Platelets: 202 10*3/uL (ref 150–400)
RBC: 4.47 MIL/uL (ref 3.87–5.11)
RDW: 12.3 % (ref 11.5–15.5)
WBC: 9.9 10*3/uL (ref 4.0–10.5)
nRBC: 0 % (ref 0.0–0.2)

## 2019-09-25 NOTE — Progress Notes (Signed)
Left side of nesk the spot she has been monitoring feels bigger than it did. She does take care of her husband and has hard time getting out of the house because he needs 1on 1 care

## 2019-09-28 NOTE — Progress Notes (Signed)
Hematology/Oncology Consult note [facility]  Telephone:([phone] [phone]  Patient Care Team: Abner Greenspan, MD as PCP - General   Name of the patient: Anita Buckley  [id number]  1951/03/27   Date of visit: 09/28/19  Diagnosis- SLL involving cervical lymph node currently under observation   Chief complaint/ Reason for visit-routine follow-up of SLL  Heme/Onc history: Patient is a 69 year old female who was diagnosed with SLL back in 2015.  She was having some problems with her thyroid gland back then an ultrasound of the soft tissue head and neck in September 2015 revealed homogeneous thyroid gland with several thyroid nodules.  Several enlarged left-sided neck lymph nodes.  FNA of the left neck no was consistent with clonal population of lymphocytes CD5 positive, CD23 positive and CD43 8+.  It was negative for malignant cells.  She also had a PET scan subsequently and her last PET scan was in 2016 which showed stable mild metabolism within a 1.2 cm level 3 lymph node but no evidence of hypermetabolism or significant adenopathy elsewhere.  CT chest in February 2018 revealed mild thoracic lymphadenopathy compatible with CLL.  She has not had any peripheral blood lymphocytosis   Interval history-patient was last seen by me in January 2020 at that time was asked to continue to follow-up with her primary care doctor since her SLL was stable for over 5 years.  Patient reports today that she is concerned about her left-sided neck node which appears bigger to her.  Reports that her appetite and weight are stable.  She is under stress taking care of her husband who has Parkinson's.  Denies any drenching night sweats.  ECOG PS- 0 Pain scale- 0   Review of systems- Review of Systems  Constitutional: Negative for chills, fever, malaise/fatigue and weight loss.  HENT: Negative for congestion, ear discharge and nosebleeds.        Left neck mass  Eyes:  Negative for blurred vision.  Respiratory: Negative for cough, hemoptysis, sputum production, shortness of breath and wheezing.   Cardiovascular: Negative for chest pain, palpitations, orthopnea and claudication.  Gastrointestinal: Negative for abdominal pain, blood in stool, constipation, diarrhea, heartburn, melena, nausea and vomiting.  Genitourinary: Negative for dysuria, flank pain, frequency, hematuria and urgency.  Musculoskeletal: Negative for back pain, joint pain and myalgias.  Skin: Negative for rash.  Neurological: Negative for dizziness, tingling, focal weakness, seizures, weakness and headaches.  Endo/Heme/Allergies: Does not bruise/bleed easily.  Psychiatric/Behavioral: Negative for depression and suicidal ideas. The patient does not have insomnia.      Allergies  Allergen Reactions  . Bee Venom     Anaphylaxis   . Buspirone Hcl     REACTION: did not like  . Minocycline Nausea And Vomiting  . Penicillins     REACTION: reaction as a child     Past Medical History:  Diagnosis Date  . Anxiety   . Cancer ([place])    B CELL LYMPHOMA  . Chronic lymphocytic leukemia (CLL), B-cell ([place]) 02/11/2016  . Depression   . History of anxiety   . History of depression   . History of fibrocystic disease of breast   . History of hyperlipidemia   . Hypertension   . Situational stress      Past Surgical History:  Procedure Laterality Date  . COLONOSCOPY  12/2005   polyps, tics  . COLONOSCOPY WITH PROPOFOL N/A 05/11/2016   Procedure: COLONOSCOPY WITH PROPOFOL;  Surgeon: Manya Silvas, MD;  Location: [facility] ENDOSCOPY;  Service: Endoscopy;  Laterality: N/A;  . THYROIDECTOMY Left 08/20/2015   Procedure: THYROIDECTOMY/ HEMITHYROIDECTOMY;  Surgeon: Beverly Gust, MD;  Location: [facility] ORS;  Service: ENT;  Laterality: Left;  . TUBAL LIGATION      Social History   Socioeconomic History  . Marital status: Married    Spouse name: Not on file  . Number of children: Not on file  .  Years of education: Not on file  . Highest education level: Not on file  Occupational History  . Not on file  Tobacco Use  . Smoking status: Never Smoker  . Smokeless tobacco: Never Used  Substance and Sexual Activity  . Alcohol use: Yes    Alcohol/week: 0.0 standard drinks    Comment: occ (wine with dinner)  . Drug use: No  . Sexual activity: Not on file  Other Topics Concern  . Not on file  Social History Narrative  . Not on file   Social Determinants of Health   Financial Resource Strain:   . Difficulty of Paying Living Expenses: Not on file  Food Insecurity:   . Worried About Charity fundraiser in the Last Year: Not on file  . Ran Out of Food in the Last Year: Not on file  Transportation Needs:   . Lack of Transportation (Medical): Not on file  . Lack of Transportation (Non-Medical): Not on file  Physical Activity:   . Days of Exercise per Week: Not on file  . Minutes of Exercise per Session: Not on file  Stress:   . Feeling of Stress : Not on file  Social Connections:   . Frequency of Communication with Friends and Family: Not on file  . Frequency of Social Gatherings with Friends and Family: Not on file  . Attends Religious Services: Not on file  . Active Member of Clubs or Organizations: Not on file  . Attends Archivist Meetings: Not on file  . Marital Status: Not on file  Intimate Partner Violence:   . Fear of Current or Ex-Partner: Not on file  . Emotionally Abused: Not on file  . Physically Abused: Not on file  . Sexually Abused: Not on file    Family History  Problem Relation Age of Onset  . Diabetes Father        late in life  . Alcohol abuse Father   . Hyperlipidemia Mother   . Mental illness Mother   . Depression Sister   . Leukemia Brother   . Bladder Cancer Brother   . Schizophrenia Other        Grandfather  . Breast cancer Neg Hx      Current Outpatient Medications:  .  alendronate (FOSAMAX) 70 MG tablet, TAKE 1 TABLET BY  MOUTH ONCE A WEEK WITH FULL GLASS OF WATER AND EMPTY STOMACH, Disp: 12 tablet, Rfl: 3 .  ALPRAZolam (XANAX) 0.5 MG tablet, Take 1 tablet (0.5 mg total) by mouth daily as needed for anxiety., Disp: 30 tablet, Rfl: 3 .  amLODipine (NORVASC) 5 MG tablet, Take 1 tablet (5 mg total) by mouth daily., Disp: 90 tablet, Rfl: 3 .  Calcium Citrate-Vitamin D (CITRACAL + D PO), Take 1-2 capsules by mouth daily. , Disp: , Rfl:  .  EPINEPHrine 0.3 mg/0.3 mL IJ SOAJ injection, INJECT 0.3 ML INTO MUSCLE AS NEEDED, Disp: 2 each, Rfl: 3 .  famotidine (PEPCID) 10 MG tablet, Take 1 tablet (10 mg total) by mouth daily., Disp: , Rfl:  .  Glucosamine-Chondroitin (MOVE FREE PO), Take 1 tablet by mouth 2 (two) times daily., Disp: , Rfl:  .  latanoprost (XALATAN) 0.005 % ophthalmic solution, Place 1 drop into both eyes at bedtime., Disp: , Rfl:  .  Loratadine 10 MG CAPS, Take 1 capsule (10 mg total) by mouth daily. (Patient not taking: Reported on 09/25/2019), Disp: 30 each, Rfl:  .  meloxicam (MOBIC) 7.5 MG tablet, Take 7.5 mg by mouth 2 (two) times daily as needed for pain., Disp: , Rfl:   Physical exam:  Vitals:   09/25/19 1422  BP: (!) 159/94  Pulse: 78  Resp: 16  Temp: (!) 96.5 F (35.8 C)  TempSrc: Tympanic  Weight: 157 lb 8 oz (71.4 kg)   Physical Exam HENT:     Head: Normocephalic and atraumatic.  Eyes:     Pupils: Pupils are equal, round, and reactive to light.  Cardiovascular:     Rate and Rhythm: Normal rate and regular rhythm.     Heart sounds: Normal heart sounds.  Pulmonary:     Effort: Pulmonary effort is normal.     Breath sounds: Normal breath sounds.  Abdominal:     General: Bowel sounds are normal. There is no distension.     Palpations: Abdomen is soft.     Tenderness: There is no abdominal tenderness.     Comments: No palpable hepatosplenomegaly  Musculoskeletal:     Cervical back: Normal range of motion.  Lymphadenopathy:     Comments: There is palpable left neck mass right about  the left clavicle which is roughly 3 cm in size.  There is also palpable left axillary adenopathy.  Skin:    General: Skin is warm and dry.  Neurological:     Mental Status: She is alert and oriented to person, place, and time.      CMP Latest Ref Rng & Units 09/25/2019  Glucose 70 - 99 mg/dL 94  BUN 8 - 23 mg/dL 19  Creatinine 0.44 - 1.00 mg/dL 0.68  Sodium 135 - 145 mmol/L 137  Potassium 3.5 - 5.1 mmol/L 3.7  Chloride 98 - 111 mmol/L 104  CO2 22 - 32 mmol/L 25  Calcium 8.9 - 10.3 mg/dL 9.1  Total Protein 6.5 - 8.1 g/dL 7.2  Total Bilirubin 0.3 - 1.2 mg/dL 0.6  Alkaline Phos 38 - 126 U/L 47  AST 15 - 41 U/L 35  ALT 0 - 44 U/L 56(H)   CBC Latest Ref Rng & Units 09/25/2019  WBC 4.0 - 10.5 K/uL 9.9  Hemoglobin 12.0 - 15.0 g/dL 12.7  Hematocrit 36.0 - 46.0 % 40.9  Platelets 150 - 400 K/uL 202     Assessment and plan- Patient is a 69 y.o. female with history of CLL here for routine follow-up  Patient has previously had a left neck biopsy back in 2015 which was consistent with CML.  It is unclear if it is the same lymph node that is enlarged presently I am also able to palpate some left axillary adenopathy .  She still does not have any peripheral leukocytosis or cytopenias.  On today's CBC we are noticing a mild lymphocytosis of 4.7.  At this time I would like to get a PET CT scan to assess her areas of adenopathy as well as the degree of hypermetabolism in the lymph nodes which can be compared to her prior PET scan back in 2016 which only showed mild hypermetabolism in the left level 3 node.  I will see the patient  for a video visit after the PET scan and discuss biopsy if need be based on the findings.    Visit Diagnosis 1. Lymphoma, small lymphocytic ([place])      Dr. Randa Evens, MD, MPH [facility] at [facility] [id number] 09/28/2019 2:51 PM

## 2019-10-04 ENCOUNTER — Other Ambulatory Visit: Payer: Self-pay

## 2019-10-04 ENCOUNTER — Ambulatory Visit
Admission: RE | Admit: 2019-10-04 | Discharge: 2019-10-04 | Disposition: A | Discharge status: Home / Self Care | Payer: Medicare Other | Source: Ambulatory Visit | Attending: Oncology | Admitting: Oncology

## 2019-10-04 DIAGNOSIS — E041 Nontoxic single thyroid nodule: Secondary | ICD-10-CM | POA: Insufficient documentation

## 2019-10-04 DIAGNOSIS — I700 Atherosclerosis of aorta: Secondary | ICD-10-CM | POA: Diagnosis not present

## 2019-10-04 DIAGNOSIS — K760 Fatty (change of) liver, not elsewhere classified: Secondary | ICD-10-CM | POA: Insufficient documentation

## 2019-10-04 DIAGNOSIS — K8020 Calculus of gallbladder without cholecystitis without obstruction: Secondary | ICD-10-CM | POA: Diagnosis not present

## 2019-10-04 DIAGNOSIS — C8590 Non-Hodgkin lymphoma, unspecified, unspecified site: Secondary | ICD-10-CM | POA: Diagnosis not present

## 2019-10-04 DIAGNOSIS — C8300 Small cell B-cell lymphoma, unspecified site: Secondary | ICD-10-CM | POA: Diagnosis not present

## 2019-10-04 LAB — GLUCOSE, CAPILLARY: Glucose-Capillary: 93 mg/dL (ref 70–99)

## 2019-10-04 MED ORDER — FLUDEOXYGLUCOSE F - 18 (FDG) INJECTION
8.1000 | Freq: Once | INTRAVENOUS | Status: AC | PRN
  Administered 2019-10-04: 09:00:00 8.71 via INTRAVENOUS

## 2019-10-05 ENCOUNTER — Inpatient Hospital Stay (HOSPITAL_BASED_OUTPATIENT_CLINIC_OR_DEPARTMENT_OTHER): Payer: Medicare Other | Admitting: Oncology

## 2019-10-05 ENCOUNTER — Encounter: Payer: Self-pay | Admitting: Oncology

## 2019-10-05 DIAGNOSIS — C8300 Small cell B-cell lymphoma, unspecified site: Secondary | ICD-10-CM

## 2019-10-05 NOTE — Progress Notes (Signed)
Patient stated that the only concern that she currently has is that she gets SOB and not sure what is triggering it. Patient is still taking her Xanax PRN.

## 2019-10-08 NOTE — Progress Notes (Signed)
I connected with Anita Buckley on 10/08/19 at  2:00 PM EST by video enabled telemedicine visit and verified that I am speaking with the correct person using two identifiers.   I discussed the limitations, risks, security and privacy concerns of performing an evaluation and management service by telemedicine and the availability of in-person appointments. I also discussed with the patient that there may be a patient responsible charge related to this service. The patient expressed understanding and agreed to proceed.  Other persons participating in the visit and their role in the encounter:  none  Patient's location:  home Provider's location:  work  Risk analyst Complaint:  Discuss pet ct results and further management  History of present illness: Patient is a 69 year old female who was diagnosed with SLL back in 2015. She was having some problems with her thyroid gland back then an ultrasound of the soft tissue head and neck in September 2015 revealed homogeneous thyroid gland with several thyroid nodules. Several enlarged left-sided neck lymph nodes. FNA of the left neck no was consistent with clonal population of lymphocytes CD5 positive, CD23 positive and CD43 8+. It was negative for malignant cells. She also had a PET scan subsequently and her last PET scan was in 2016 which showed stable mild metabolism within a 1.2 cm level 3 lymph node but no evidence of hypermetabolism or significant adenopathy elsewhere. CT chest in February 2018 revealed mild thoracic lymphadenopathy compatible with CLL. She has not had any peripheral blood lymphocytosis  Interval history: feels well. dnies any complaints.   Review of Systems  Constitutional: Negative for chills, fever, malaise/fatigue and weight loss.  HENT: Negative for congestion, ear discharge and nosebleeds.   Eyes: Negative for blurred vision.  Respiratory: Negative for cough, hemoptysis, sputum production, shortness of breath and wheezing.    Cardiovascular: Negative for chest pain, palpitations, orthopnea and claudication.  Gastrointestinal: Negative for abdominal pain, blood in stool, constipation, diarrhea, heartburn, melena, nausea and vomiting.  Genitourinary: Negative for dysuria, flank pain, frequency, hematuria and urgency.  Musculoskeletal: Negative for back pain, joint pain and myalgias.  Skin: Negative for rash.  Neurological: Negative for dizziness, tingling, focal weakness, seizures, weakness and headaches.  Endo/Heme/Allergies: Does not bruise/bleed easily.  Psychiatric/Behavioral: Negative for depression and suicidal ideas. The patient does not have insomnia.     Allergies  Allergen Reactions  . Bee Venom     Anaphylaxis   . Buspirone Hcl     REACTION: did not like  . Minocycline Nausea And Vomiting  . Penicillins     REACTION: reaction as a child    Past Medical History:  Diagnosis Date  . Anxiety   . Cancer ([place])    B CELL LYMPHOMA  . Chronic lymphocytic leukemia (CLL), B-cell ([place]) 02/11/2016  . Depression   . History of anxiety   . History of depression   . History of fibrocystic disease of breast   . History of hyperlipidemia   . Hypertension   . Situational stress     Past Surgical History:  Procedure Laterality Date  . COLONOSCOPY  12/2005   polyps, tics  . COLONOSCOPY WITH PROPOFOL N/A 05/11/2016   Procedure: COLONOSCOPY WITH PROPOFOL;  Surgeon: Manya Silvas, MD;  Location[facility] ENDOSCOPY;  Service: Endoscopy;  Laterality: N/A;  . THYROIDECTOMY Left 08/20/2015   Procedure: THYROIDECTOMY/ HEMITHYROIDECTOMY;  Surgeon: Beverly Gust, MD;  Location: [facility] ORS;  Service: ENT;  Laterality: Left;  . TUBAL LIGATION      Social History   Socioeconomic  History  . Marital status: Married    Spouse name: Not on file  . Number of children: Not on file  . Years of education: Not on file  . Highest education level: Not on file  Occupational History  . Not on file  Tobacco Use  .  Smoking status: Never Smoker  . Smokeless tobacco: Never Used  Substance and Sexual Activity  . Alcohol use: Yes    Alcohol/week: 0.0 standard drinks    Comment: occ (wine with dinner)  . Drug use: No  . Sexual activity: Not on file  Other Topics Concern  . Not on file  Social History Narrative  . Not on file   Social Determinants of Health   Financial Resource Strain:   . Difficulty of Paying Living Expenses: Not on file  Food Insecurity:   . Worried About Charity fundraiser in the Last Year: Not on file  . Ran Out of Food in the Last Year: Not on file  Transportation Needs:   . Lack of Transportation (Medical): Not on file  . Lack of Transportation (Non-Medical): Not on file  Physical Activity:   . Days of Exercise per Week: Not on file  . Minutes of Exercise per Session: Not on file  Stress:   . Feeling of Stress : Not on file  Social Connections:   . Frequency of Communication with Friends and Family: Not on file  . Frequency of Social Gatherings with Friends and Family: Not on file  . Attends Religious Services: Not on file  . Active Member of Clubs or Organizations: Not on file  . Attends Archivist Meetings: Not on file  . Marital Status: Not on file  Intimate Partner Violence:   . Fear of Current or Ex-Partner: Not on file  . Emotionally Abused: Not on file  . Physically Abused: Not on file  . Sexually Abused: Not on file    Family History  Problem Relation Age of Onset  . Diabetes Father        late in life  . Alcohol abuse Father   . Hyperlipidemia Mother   . Mental illness Mother   . Depression Sister   . Leukemia Brother   . Bladder Cancer Brother   . Schizophrenia Other        Grandfather  . Breast cancer Neg Hx      Current Outpatient Medications:  .  alendronate (FOSAMAX) 70 MG tablet, TAKE 1 TABLET BY MOUTH ONCE A WEEK WITH FULL GLASS OF WATER AND EMPTY STOMACH, Disp: 12 tablet, Rfl: 3 .  ALPRAZolam (XANAX) 0.5 MG tablet, Take 1  tablet (0.5 mg total) by mouth daily as needed for anxiety., Disp: 30 tablet, Rfl: 3 .  amLODipine (NORVASC) 5 MG tablet, Take 1 tablet (5 mg total) by mouth daily., Disp: 90 tablet, Rfl: 3 .  Calcium Citrate-Vitamin D (CITRACAL + D PO), Take 1-2 capsules by mouth daily. , Disp: , Rfl:  .  diphenhydrAMINE (BENADRYL) 50 MG tablet, Take 50 mg by mouth at bedtime as needed for itching., Disp: , Rfl:  .  EPINEPHrine 0.3 mg/0.3 mL IJ SOAJ injection, INJECT 0.3 ML INTO MUSCLE AS NEEDED, Disp: 2 each, Rfl: 3 .  Glucosamine-Chondroitin (MOVE FREE PO), Take 1 tablet by mouth 2 (two) times daily., Disp: , Rfl:  .  latanoprost (XALATAN) 0.005 % ophthalmic solution, Place 1 drop into both eyes at bedtime., Disp: , Rfl:  .  famotidine (PEPCID) 10 MG tablet, Take  1 tablet (10 mg total) by mouth daily. (Patient not taking: Reported on 10/05/2019), Disp: , Rfl:   NM PET Image Restag (PS) Skull Base To Thigh  Result Date: 10/05/2019 CLINICAL DATA:  Subsequent treatment strategy for lymphoma. Small lymphocytic type. Nodule on left neck. EXAM: NUCLEAR MEDICINE PET SKULL BASE TO THIGH TECHNIQUE: 8.7 mCi F-18 FDG was injected intravenously. Full-ring PET imaging was performed from the skull base to thigh after the radiotracer. CT data was obtained and used for attenuation correction and anatomic localization. Fasting blood glucose: 93 mg/dl COMPARISON:  07/26/2015.  Chest CT 09/28/2016. FINDINGS: Mediastinal blood pool activity: SUV max 2.3 Liver activity: SUV max 2.9 NECK: Index left low jugular/supraclavicular node measures 1.9 cm and a S.U.V. max of 2.2 on 44/3. Compare 8 mm and a S.U.V. max of 3.8 on the prior exam. New right-sided thyroid hypermetabolism, including at a S.U.V. max of 4.3. There may be a subtle hypoattenuating 1.0 cm nodule in this region on 60/3. Incidental CT findings: Bilateral cervical adenopathy. More anterior left-sided level 3 node measures 1.2 cm on 43/3 versus 1.0 cm on 08/05/2015. CHEST: New mild  hypermetabolism within axillary nodes. An index left-sided node measures 1.3 cm and a S.U.V. max of 2.4. This node measured 8 mm on the prior. Incidental CT findings: Normal heart size without pericardial or pleural effusion. Subcarinal node measures 8 mm today and is similar. ABDOMEN/PELVIS: low-level hypermetabolism within pelvic nodes. Example right inguinal node measures 9 mm and a S.U.V. max of 1.3 on 232/3. Compare 7 mm and a S.U.V. max of 1.0 on the prior. No abdominopelvic parenchymal hypermetabolism. Incidental CT findings: Stone within the gallbladder neck. Nonspecific gallbladder wall calcification. Moderate hepatic steatosis. Abdominal aortic atherosclerosis. Multiple small abdominal retroperitoneal nodes. Index right external iliac node measures 1.5 cm on 232/3 versus 1.3 cm on the prior exam (when remeasured). SKELETON: No abnormal marrow activity. Incidental CT findings: none IMPRESSION: 1. Since 08/05/2015, mild progression of disease, as evidenced by enlargement and less so increase in metabolic activity of nodes, as detailed above. ([place]) 3 2. New right thyroid hypermetabolism with suggestion of underlying nodule. Consider thyroid ultrasound. 3.  Aortic Atherosclerosis (ICD10-I70.0). 4. Incidental findings, including: Cholelithiasis. Hepatic steatosis. Electronically Signed   By: Abigail Miyamoto M.D.   On: 10/05/2019 10:51    No images are attached to the encounter.   CMP Latest Ref Rng & Units 09/25/2019  Glucose 70 - 99 mg/dL 94  BUN 8 - 23 mg/dL 19  Creatinine 0.44 - 1.00 mg/dL 0.68  Sodium 135 - 145 mmol/L 137  Potassium 3.5 - 5.1 mmol/L 3.7  Chloride 98 - 111 mmol/L 104  CO2 22 - 32 mmol/L 25  Calcium 8.9 - 10.3 mg/dL 9.1  Total Protein 6.5 - 8.1 g/dL 7.2  Total Bilirubin 0.3 - 1.2 mg/dL 0.6  Alkaline Phos 38 - 126 U/L 47  AST 15 - 41 U/L 35  ALT 0 - 44 U/L 56(H)   CBC Latest Ref Rng & Units 09/25/2019  WBC 4.0 - 10.5 K/uL 9.9  Hemoglobin 12.0 - 15.0 g/dL 12.7  Hematocrit  36.0 - 46.0 % 40.9  Platelets 150 - 400 K/uL 202     Observation/objective:appears in no acute distress over video visit today. Breathing is non labored  Assessment and plan: Patient is a 69 year old female with h/o SLL and this is a visit to discuss PET/CTresults  PET CT shows multiple areas of adenopathy mainly cervical, axillary as well as intra abdominal all  of which are mildly more progressive as compared to 2016. However SUV uptake in these areas is still quite low. No B symptoms or cytopenias. I therefore do not suspect high grade transformation. She does not need any treatment for SLL at this time and this can be continued to be observed  Also noted to have thyroid nodule for which I will order Thyroid USG  Follow-up instructions: cbc with diff, cmp and ldh and in person visit in 6 months  I discussed the assessment and treatment plan with the patient. The patient was provided an opportunity to ask questions and all were answered. The patient agreed with the plan and demonstrated an understanding of the instructions.   The patient was advised to call back or seek an in-person evaluation if the symptoms worsen or if the condition fails to improve as anticipated.   Visit Diagnosis: 1. Lymphoma, small lymphocytic ([place])     Dr. Randa Evens, MD, MPH [facility] at [facility] Tel- [id number] 10/08/2019 10:39 AM

## 2019-10-11 ENCOUNTER — Other Ambulatory Visit: Payer: Self-pay

## 2019-10-11 ENCOUNTER — Ambulatory Visit
Admission: RE | Admit: 2019-10-11 | Discharge: 2019-10-11 | Disposition: A | Discharge status: Home / Self Care | Payer: Medicare Other | Source: Ambulatory Visit | Attending: Oncology | Admitting: Oncology

## 2019-10-11 DIAGNOSIS — C8300 Small cell B-cell lymphoma, unspecified site: Secondary | ICD-10-CM | POA: Diagnosis not present

## 2019-10-11 DIAGNOSIS — E041 Nontoxic single thyroid nodule: Secondary | ICD-10-CM | POA: Diagnosis not present

## 2019-10-12 ENCOUNTER — Telehealth: Payer: Self-pay | Admitting: Family Medicine

## 2019-10-12 DIAGNOSIS — E041 Nontoxic single thyroid nodule: Secondary | ICD-10-CM | POA: Insufficient documentation

## 2019-10-12 DIAGNOSIS — I10 Essential (primary) hypertension: Secondary | ICD-10-CM

## 2019-10-12 NOTE — Telephone Encounter (Signed)
-----   Message from Sindy Guadeloupe, MD sent at 10/12/2019  8:31 AM EST ----- Hello Dr. Glori Bickers, Please see PET and US thyroid report. Would you like to take care of it or do you want me to refer her to ENT?

## 2019-10-12 NOTE — Telephone Encounter (Signed)
Please let pt know that I placed ENT ref for her thyroid nodule  I also ordered TSH (to come in when convenient)   I will forward to [facility] to make that appt

## 2019-10-12 NOTE — Telephone Encounter (Signed)
Rosaria Ferries discussed this with pt

## 2019-10-13 ENCOUNTER — Other Ambulatory Visit: Payer: Self-pay

## 2019-10-13 ENCOUNTER — Other Ambulatory Visit (INDEPENDENT_AMBULATORY_CARE_PROVIDER_SITE_OTHER): Payer: Medicare Other

## 2019-10-13 DIAGNOSIS — I10 Essential (primary) hypertension: Secondary | ICD-10-CM | POA: Diagnosis not present

## 2019-10-13 DIAGNOSIS — E041 Nontoxic single thyroid nodule: Secondary | ICD-10-CM | POA: Diagnosis not present

## 2019-10-13 LAB — TSH: TSH: 5.28 u[IU]/mL — ABNORMAL HIGH (ref 0.35–4.50)

## 2019-10-17 ENCOUNTER — Telehealth: Payer: Self-pay | Admitting: Family Medicine

## 2019-10-17 ENCOUNTER — Other Ambulatory Visit (INDEPENDENT_AMBULATORY_CARE_PROVIDER_SITE_OTHER): Payer: Medicare Other

## 2019-10-17 ENCOUNTER — Other Ambulatory Visit: Payer: Self-pay | Admitting: Family Medicine

## 2019-10-17 ENCOUNTER — Other Ambulatory Visit: Payer: Self-pay

## 2019-10-17 DIAGNOSIS — E041 Nontoxic single thyroid nodule: Secondary | ICD-10-CM | POA: Diagnosis not present

## 2019-10-17 LAB — T4, FREE: Free T4: 0.77 ng/dL (ref 0.60–1.60)

## 2019-10-17 LAB — T3, FREE: T3, Free: 3.6 pg/mL (ref 2.3–4.2)

## 2019-10-17 LAB — TSH: TSH: 3.84 u[IU]/mL (ref 0.35–4.50)

## 2019-10-18 ENCOUNTER — Encounter: Payer: Self-pay | Admitting: *Deleted

## 2019-10-18 LAB — T3 UPTAKE: T3 Uptake Ratio: 24 % (ref 24–39)

## 2019-10-26 DIAGNOSIS — R221 Localized swelling, mass and lump, neck: Secondary | ICD-10-CM | POA: Diagnosis not present

## 2019-10-26 DIAGNOSIS — E041 Nontoxic single thyroid nodule: Secondary | ICD-10-CM | POA: Diagnosis not present

## 2019-10-30 ENCOUNTER — Other Ambulatory Visit: Payer: Self-pay | Admitting: Unknown Physician Specialty

## 2019-10-30 DIAGNOSIS — R221 Localized swelling, mass and lump, neck: Secondary | ICD-10-CM

## 2019-10-30 DIAGNOSIS — C770 Secondary and unspecified malignant neoplasm of lymph nodes of head, face and neck: Secondary | ICD-10-CM

## 2019-11-06 ENCOUNTER — Other Ambulatory Visit: Payer: Self-pay | Admitting: Radiology

## 2019-11-07 ENCOUNTER — Other Ambulatory Visit: Payer: Self-pay

## 2019-11-07 ENCOUNTER — Ambulatory Visit
Admission: RE | Admit: 2019-11-07 | Discharge: 2019-11-07 | Disposition: A | Discharge status: Home / Self Care | Payer: Medicare Other | Source: Ambulatory Visit | Attending: Unknown Physician Specialty | Admitting: Unknown Physician Specialty

## 2019-11-07 DIAGNOSIS — R590 Localized enlarged lymph nodes: Secondary | ICD-10-CM | POA: Diagnosis not present

## 2019-11-07 DIAGNOSIS — R221 Localized swelling, mass and lump, neck: Secondary | ICD-10-CM | POA: Diagnosis not present

## 2019-11-07 DIAGNOSIS — Z91030 Bee allergy status: Secondary | ICD-10-CM | POA: Insufficient documentation

## 2019-11-07 DIAGNOSIS — Z7983 Long term (current) use of bisphosphonates: Secondary | ICD-10-CM | POA: Diagnosis not present

## 2019-11-07 DIAGNOSIS — Z881 Allergy status to other antibiotic agents status: Secondary | ICD-10-CM | POA: Insufficient documentation

## 2019-11-07 DIAGNOSIS — Z8349 Family history of other endocrine, nutritional and metabolic diseases: Secondary | ICD-10-CM | POA: Insufficient documentation

## 2019-11-07 DIAGNOSIS — E785 Hyperlipidemia, unspecified: Secondary | ICD-10-CM | POA: Diagnosis not present

## 2019-11-07 DIAGNOSIS — C770 Secondary and unspecified malignant neoplasm of lymph nodes of head, face and neck: Secondary | ICD-10-CM

## 2019-11-07 DIAGNOSIS — Z79899 Other long term (current) drug therapy: Secondary | ICD-10-CM | POA: Insufficient documentation

## 2019-11-07 DIAGNOSIS — F419 Anxiety disorder, unspecified: Secondary | ICD-10-CM | POA: Insufficient documentation

## 2019-11-07 DIAGNOSIS — E079 Disorder of thyroid, unspecified: Secondary | ICD-10-CM | POA: Insufficient documentation

## 2019-11-07 DIAGNOSIS — Z880 Allergy status to penicillin: Secondary | ICD-10-CM | POA: Insufficient documentation

## 2019-11-07 DIAGNOSIS — E890 Postprocedural hypothyroidism: Secondary | ICD-10-CM | POA: Insufficient documentation

## 2019-11-07 DIAGNOSIS — C8301 Small cell B-cell lymphoma, lymph nodes of head, face, and neck: Secondary | ICD-10-CM | POA: Diagnosis not present

## 2019-11-07 DIAGNOSIS — I10 Essential (primary) hypertension: Secondary | ICD-10-CM | POA: Diagnosis not present

## 2019-11-07 DIAGNOSIS — Z833 Family history of diabetes mellitus: Secondary | ICD-10-CM | POA: Diagnosis not present

## 2019-11-07 DIAGNOSIS — Z8052 Family history of malignant neoplasm of bladder: Secondary | ICD-10-CM | POA: Diagnosis not present

## 2019-11-07 DIAGNOSIS — E041 Nontoxic single thyroid nodule: Secondary | ICD-10-CM | POA: Diagnosis not present

## 2019-11-07 DIAGNOSIS — Z806 Family history of leukemia: Secondary | ICD-10-CM | POA: Insufficient documentation

## 2019-11-07 DIAGNOSIS — F329 Major depressive disorder, single episode, unspecified: Secondary | ICD-10-CM | POA: Diagnosis not present

## 2019-11-07 LAB — CBC
HCT: 39.4 % (ref 36.0–46.0)
Hemoglobin: 12.6 g/dL (ref 12.0–15.0)
MCH: 28.8 pg (ref 26.0–34.0)
MCHC: 32 g/dL (ref 30.0–36.0)
MCV: 90.2 fL (ref 80.0–100.0)
Platelets: 198 10*3/uL (ref 150–400)
RBC: 4.37 MIL/uL (ref 3.87–5.11)
RDW: 12.4 % (ref 11.5–15.5)
WBC: 9.5 10*3/uL (ref 4.0–10.5)
nRBC: 0 % (ref 0.0–0.2)

## 2019-11-07 MED ORDER — MIDAZOLAM HCL 2 MG/2ML IJ SOLN
INTRAMUSCULAR | Status: AC
  Filled 2019-11-07: qty 2

## 2019-11-07 MED ORDER — FENTANYL CITRATE (PF) 100 MCG/2ML IJ SOLN
INTRAMUSCULAR | Status: AC
  Filled 2019-11-07: qty 2

## 2019-11-07 MED ORDER — FENTANYL CITRATE (PF) 100 MCG/2ML IJ SOLN
INTRAMUSCULAR | Status: AC | PRN
  Administered 2019-11-07: 50 ug via INTRAVENOUS

## 2019-11-07 MED ORDER — MIDAZOLAM HCL 2 MG/2ML IJ SOLN
INTRAMUSCULAR | Status: AC | PRN
  Administered 2019-11-07: 1 mg via INTRAVENOUS

## 2019-11-07 MED ORDER — SODIUM CHLORIDE 0.9 % IV SOLN
INTRAVENOUS | Status: DC
  Administered 2019-11-07: 20 mL/h via INTRAVENOUS

## 2019-11-07 NOTE — Discharge Instructions (Signed)
Moderate Conscious Sedation, Adult, Care After These instructions provide you with information about caring for yourself after your procedure. Your health care provider may also give you more specific instructions. Your treatment has been planned according to current medical practices, but problems sometimes occur. Call your health care provider if you have any problems or questions after your procedure. What can I expect after the procedure? After your procedure, it is common:  To feel sleepy for several hours.  To feel clumsy and have poor balance for several hours.  To have poor judgment for several hours.  To vomit if you eat too soon. Follow these instructions at home: For at least 24 hours after the procedure:   Do not: ? Participate in activities where you could fall or become injured. ? Drive. ? Use heavy machinery. ? Drink alcohol. ? Take sleeping pills or medicines that cause drowsiness. ? Make important decisions or sign legal documents. ? Take care of children on your own.  Rest. Eating and drinking  Follow the diet recommended by your health care provider.  If you vomit: ? Drink water, juice, or soup when you can drink without vomiting. ? Make sure you have little or no nausea before eating solid foods. General instructions  Have a responsible adult stay with you until you are awake and alert.  Take over-the-counter and prescription medicines only as told by your health care provider.  If you smoke, do not smoke without supervision.  Keep all follow-up visits as told by your health care provider. This is important. Contact a health care provider if:  You keep feeling nauseous or you keep vomiting.  You feel light-headed.  You develop a rash.  You have a fever. Get help right away if:  You have trouble breathing. This information is not intended to replace advice given to you by your health care provider. Make sure you discuss any questions you have  with your health care provider. Document Revised: 07/16/2017 Document Reviewed: 11/23/2015 Elsevier Patient Education  2020 Elsevier Inc[employer] Needle Biopsy, Care After These instructions tell you how to care for yourself after your procedure. Your doctor may also give you more specific instructions. Call your doctor if you have any problems or questions. What can I expect after the procedure? After the procedure, it is common to have:  Soreness.  Bruising.  Mild pain. Follow these instructions at home:   Return to your normal activities as told by your doctor. Ask your doctor what activities are safe for you.  Take over-the-counter and prescription medicines only as told by your doctor.  Wash your hands with soap and water before you change your bandage (dressing). If you cannot use soap and water, use hand sanitizer.  Follow instructions from your doctor about: ? How to take care of your puncture site. ? When and how to change your bandage. ? When to remove your bandage.  Check your puncture site every day for signs of infection. Watch for: ? Redness, swelling, or pain. ? Fluid or blood. ? Pus or a bad smell. ? Warmth.  Do not take baths, swim, or use a hot tub until your doctor approves. Ask your doctor if you may take showers. You may only be allowed to take sponge baths.  Keep all follow-up visits as told by your doctor. This is important. Contact a doctor if you have:  A fever.  Redness, swelling, or pain at the puncture site, and it lasts longer than a few days.  Fluid,   blood, or pus coming from the puncture site.  Warmth coming from the puncture site. Get help right away if:  You have a lot of bleeding from the puncture site. Summary  After the procedure, it is common to have soreness, bruising, or mild pain at the puncture site.  Check your puncture site every day for signs of infection, such as redness, swelling, or pain.  Get help right away if you have  severe bleeding from your puncture site. This information is not intended to replace advice given to you by your health care provider. Make sure you discuss any questions you have with your health care provider. Document Revised: 08/16/2017 Document Reviewed: 08/16/2017 Elsevier Patient Education  2020 Elsevier Inc.  

## 2019-11-07 NOTE — Procedures (Signed)
Interventional Radiology Procedure:   Indications: History of small lymphocytic lymphoma with enlarged cervical lymph node and hypermetabolism in right thyroid lobe.  Procedure: 1)  [place] FNA of right inferior thyroid nodule 2) [place] core biopsy of enlarged left cervical lymph node   Findings: 5 FNAs from right inferior thyroid nodule.  7 cores from left cervical lymph node.   Complications: None     EBL: less than 10 ml  Plan: Discharge to home after 1 hour.    Anita Buckley R. Anselm Pancoast, MD  Pager: [phone]

## 2019-11-07 NOTE — Consult Note (Signed)
Chief Complaint: Patient was seen in consultation today for ultrasound-guided left cervical lymph node biopsy and ultrasound-guided biopsy of right thyroid nodule at the request of [place]  Referring Physician(s): McQueen,Chapman  Patient Status: [facility] - Out-pt  History of Present Illness: Anita Buckley is a 69 y.o. female with history of small lymphocytic lymphoma.  Patient is currently asymptomatic.  Recent PET/CT demonstrated mild enlargement of lymph nodes and new right thyroid hypermetabolism.  History of a left hemithyroidectomy in 2017 and the pathology demonstrated lymphocytic thyroiditis.  Request for ultrasound-guided biopsy of enlarged left cervical lymph node and fine-needle aspiration of inferior right thyroid nodule.  Patient has been fully vaccinated for COVID-19.  Past Medical History:  Diagnosis Date  . Anxiety   . Cancer ([place])    B CELL LYMPHOMA  . Chronic lymphocytic leukemia (CLL), B-cell ([place]) 02/11/2016  . Depression   . History of anxiety   . History of depression   . History of fibrocystic disease of breast   . History of hyperlipidemia   . Hypertension   . Situational stress     Past Surgical History:  Procedure Laterality Date  . COLONOSCOPY  12/2005   polyps, tics  . COLONOSCOPY WITH PROPOFOL N/A 05/11/2016   Procedure: COLONOSCOPY WITH PROPOFOL;  Surgeon: Manya Silvas, MD;  Location: [facility] ENDOSCOPY;  Service: Endoscopy;  Laterality: N/A;  . THYROIDECTOMY Left 08/20/2015   Procedure: THYROIDECTOMY/ HEMITHYROIDECTOMY;  Surgeon: Beverly Gust, MD;  Location: [facility] ORS;  Service: ENT;  Laterality: Left;  . TUBAL LIGATION      Allergies: Bee venom, Buspirone hcl, Minocycline, and Penicillins  Medications: Prior to Admission medications   Medication Sig Start Date End Date Taking? Authorizing Provider  alendronate (FOSAMAX) 70 MG tablet TAKE 1 TABLET BY MOUTH ONCE A WEEK WITH FULL GLASS OF WATER AND EMPTY STOMACH 04/04/19  Yes Tower,  Wynelle Fanny, MD  ALPRAZolam (XANAX) 0.5 MG tablet Take 1 tablet (0.5 mg total) by mouth daily as needed for anxiety. 07/18/19  Yes Tower, Wynelle Fanny, MD  amLODipine (NORVASC) 5 MG tablet Take 1 tablet (5 mg total) by mouth daily. 04/04/19  Yes Tower, Wynelle Fanny, MD  Calcium Citrate-Vitamin D (CITRACAL + D PO) Take 1-2 capsules by mouth daily.    Yes [provider]  Glucosamine-Chondroitin (MOVE FREE PO) Take 1 tablet by mouth 2 (two) times daily.   Yes [provider]  latanoprost (XALATAN) 0.005 % ophthalmic solution Place 1 drop into both eyes at bedtime.   Yes [provider]  diphenhydrAMINE (BENADRYL) 50 MG tablet Take 50 mg by mouth at bedtime as needed for itching.    [provider]  EPINEPHrine 0.3 mg/0.3 mL IJ SOAJ injection INJECT 0.3 ML INTO MUSCLE AS NEEDED 04/04/19   Tower, Wynelle Fanny, MD  famotidine (PEPCID) 10 MG tablet Take 1 tablet (10 mg total) by mouth daily. Patient not taking: Reported on 10/05/2019 02/07/16   Abner Greenspan, MD     Family History  Problem Relation Age of Onset  . Diabetes Father        late in life  . Alcohol abuse Father   . Hyperlipidemia Mother   . Mental illness Mother   . Depression Sister   . Leukemia Brother   . Bladder Cancer Brother   . Schizophrenia Other        Grandfather  . Breast cancer Neg Hx     Social History   Socioeconomic History  . Marital status: Married  Spouse name: Not on file  . Number of children: Not on file  . Years of education: Not on file  . Highest education level: Not on file  Occupational History  . Not on file  Tobacco Use  . Smoking status: Never Smoker  . Smokeless tobacco: Never Used  Substance and Sexual Activity  . Alcohol use: Yes    Alcohol/week: 0.0 standard drinks    Comment: occ (wine with dinner)  . Drug use: No  . Sexual activity: Not on file  Other Topics Concern  . Not on file  Social History Narrative   Lives at home in private residence with husband    Social Determinants of Health   Financial Resource Strain:   . Difficulty of Paying Living Expenses:   Food Insecurity:   . Worried About Charity fundraiser in the Last Year:   . Arboriculturist in the Last Year:   Transportation Needs:   . Film/video editor (Medical):   Marland Kitchen Lack of Transportation (Non-Medical):   Physical Activity:   . Days of Exercise per Week:   . Minutes of Exercise per Session:   Stress:   . Feeling of Stress :   Social Connections:   . Frequency of Communication with Friends and Family:   . Frequency of Social Gatherings with Friends and Family:   . Attends Religious Services:   . Active Member of Clubs or Organizations:   . Attends Archivist Meetings:   Marland Kitchen Marital Status:      Review of Systems: A 12 point ROS discussed and pertinent positives are indicated in the HPI above.  All other systems are negative.  Review of Systems  Constitutional: Negative.   Respiratory: Negative.   Cardiovascular: Negative.   Gastrointestinal: Negative.   Genitourinary: Negative.     Vital Signs: BP (!) 152/79   Pulse 69   Temp 98.1 F (36.7 C) (Oral)   Resp 18   Ht 5\' 5"  (1.651 m)   Wt 68.9 kg   SpO2 98%   BMI 25.29 kg/m   Physical Exam Vitals reviewed.  Constitutional:      Appearance: Normal appearance. She is not ill-appearing.  Cardiovascular:     Rate and Rhythm: Normal rate and regular rhythm.     Heart sounds: Normal heart sounds.  Pulmonary:     Effort: Pulmonary effort is normal.     Breath sounds: Normal breath sounds.  Abdominal:     General: Abdomen is flat. Bowel sounds are normal.     Palpations: Abdomen is soft.  Neurological:     Mental Status: She is alert.   Airway: Mallampati 2  Imaging: US THYROID  Result Date: 10/12/2019 CLINICAL DATA:  Right thyroid  Hypermetabolism on PET-CT.  Lymphoma. EXAM: THYROID ULTRASOUND TECHNIQUE: Ultrasound examination of the thyroid gland and adjacent soft tissues was  performed. COMPARISON:  PET-CT 10/04/2019, ultrasound 06/28/2012 FINDINGS: Parenchymal Echotexture: Mildly heterogenous Isthmus: 0.3 cm thickness, previously 0.4 Right lobe: 4.6 x 1.9 x 1.5 cm, previously 4.1 x 1.6 x 1.2 Left lobe: Surgically absent _________________________________________________________ Estimated total number of nodules >/= 1 cm: 3 Number of spongiform nodules >/=  2 cm not described below (TR1): 0 Number of mixed cystic and solid nodules >/= 1.5 cm not described below ([place]): 0 _________________________________________________________ Nodule # 1: Location: Right; Superior Maximum size: 1 cm; Other 2 dimensions: 0.9 x 0.7 cm (previously 0.8 x 0.7) Composition: solid/almost completely solid (2) Echogenicity: isoechoic (1) Shape: not  taller-than-wide (0) Margins: ill-defined (0) Echogenic foci: none (0) ACR TI-RADS total points: 3. ACR TI-RADS risk category: TR3 (3 points). ACR TI-RADS recommendations: Given size (<1.4 cm) and appearance, this nodule does NOT meet TI-RADS criteria for biopsy or dedicated follow-up. _________________________________________________________ Nodule # 2: Location: Right; Mid Maximum size: 1.1 cm; Other 2 dimensions: 0.9 x 0.5 cm (previously 1.3 x 1 x 0.6) Composition: solid/almost completely solid (2) Echogenicity: isoechoic (1) Shape: not taller-than-wide (0) Margins: ill-defined (0) Echogenic foci: none (0) ACR TI-RADS total points: 3. ACR TI-RADS risk category: TR3 (3 points). ACR TI-RADS recommendations: Given size (<1.4 cm) and appearance, this nodule does NOT meet TI-RADS criteria for biopsy or dedicated follow-up. _________________________________________________________ Nodule # 3: Location: Right; Inferior Maximum size: 1.3 cm; Other 2 dimensions: 1.1 x 0.9 cm (previously 0.8 x 0.8 x 0.7) Composition: mixed cystic and solid (1) Echogenicity: isoechoic (1) Shape: not taller-than-wide (0) Margins: ill-defined (0) Echogenic foci: none (0) ACR TI-RADS total points:  2. ACR TI-RADS risk category: TR2 (2 points). ACR TI-RADS recommendations: This nodule does NOT meet TI-RADS criteria for biopsy or dedicated follow-up based on ultrasound findings, although it does appear to best correlate with the focus of hypermetabolic activity on PET-CT, which increases likelihood of malignancy. _________________________________________________________ Right cervical node measuring 0.7 cm short axis diameter is noted. Hypoechoic left cervical node measuring 0.9 cm short axis diameter. IMPRESSION: 1. Slight increase in size of inferior right nodule, corresponding to hypermetabolic focus on PET-CT. Consider FNA biopsy. 2. Bilateral cervical adenopathy. The above is in keeping with the ACR TI-RADS recommendations - J Am Coll Radiol 2017;14:587-595. Electronically Signed   By: Lucrezia Europe M.D.   On: 10/12/2019 08:21    Labs:  CBC: Recent Labs    09/25/19 1356  WBC 9.9  HGB 12.7  HCT 40.9  PLT 202    COAGS: No results for input(s): INR, APTT in the last 8760 hours.  BMP: Recent Labs    09/25/19 1356  NA 137  K 3.7  CL 104  CO2 25  GLUCOSE 94  BUN 19  CALCIUM 9.1  CREATININE 0.68  GFRNONAA >60  GFRAA >60    LIVER FUNCTION TESTS: Recent Labs    09/25/19 1356  BILITOT 0.6  AST 35  ALT 56*  ALKPHOS 47  PROT 7.2  ALBUMIN 4.6    TUMOR MARKERS: No results for input(s): AFPTM, CEA, CA199, CHROMGRNA in the last 8760 hours.  Assessment and Plan:  69 year old with history small lymphocytic lymphoma.  Enlarged cervical lymph nodes and a suspicious right thyroid nodule.  Plan for ultrasound-guided core biopsy of left cervical lymph node and ultrasound-guided fine-needle aspiration of a right inferior thyroid nodule.  Risks and benefits of ultrasound-guided biopsy was discussed with the patient and/or patient's family including, but not limited to bleeding, infection, damage to adjacent structures or low yield requiring additional tests.  All of the questions  were answered and there is agreement to proceed.  Consent signed and in chart.   Thank you for this interesting consult.  I greatly enjoyed meeting Anita Buckley and look forward to participating in their care.  A copy of this report was sent to the requesting provider on this date.  Electronically Signed: Burman Riis, MD 11/07/2019, 10:06 AM   I spent a total of  10 minutes   in face to face in clinical consultation, greater than 50% of which was counseling/coordinating care for ultrasound-guided biopsy.

## 2019-11-08 LAB — CYTOLOGY - NON PAP

## 2019-11-09 LAB — SURGICAL PATHOLOGY

## 2019-11-13 ENCOUNTER — Other Ambulatory Visit: Payer: Self-pay | Admitting: Family Medicine

## 2019-11-13 DIAGNOSIS — Z1231 Encounter for screening mammogram for malignant neoplasm of breast: Secondary | ICD-10-CM

## 2019-12-06 ENCOUNTER — Encounter: Payer: Self-pay | Admitting: Oncology

## 2019-12-11 ENCOUNTER — Ambulatory Visit
Admission: RE | Admit: 2019-12-11 | Discharge: 2019-12-11 | Disposition: A | Discharge status: Home / Self Care | Payer: Medicare Other | Source: Ambulatory Visit | Attending: Family Medicine | Admitting: Family Medicine

## 2019-12-11 DIAGNOSIS — Z1231 Encounter for screening mammogram for malignant neoplasm of breast: Secondary | ICD-10-CM | POA: Diagnosis not present

## 2020-01-09 ENCOUNTER — Ambulatory Visit (INDEPENDENT_AMBULATORY_CARE_PROVIDER_SITE_OTHER): Payer: Medicare Other

## 2020-01-09 DIAGNOSIS — Z0000 Encounter for general adult medical examination without abnormal findings: Secondary | ICD-10-CM

## 2020-01-09 NOTE — Patient Instructions (Signed)
[name] , Thank you for taking time to come for your Medicare Wellness Visit. I appreciate your ongoing commitment to your health goals. Please review the following plan we discussed and let me know if I can assist you in the future.   Screening recommendations/referrals: Colonoscopy: Up to date, completed 05/11/2016 Mammogram: Up to date, completed 12/11/2019 Bone Density: Up to date, completed 03/28/2018 Recommended yearly ophthalmology/optometry visit for glaucoma screening and checkup Recommended yearly dental visit for hygiene and checkup  Vaccinations: Influenza vaccine: Up to date, completed 06/21/2019 Pneumococcal vaccine: Completed series Tdap vaccine: Up to date, completed 01/09/2014 Shingles vaccine: Completed series    Advanced directives: Please bring a copy of your POA (Power of [place]) and/or Living Will to your next appointment.   Conditions/risks identified: hypertension, hypercholesterolemia  Next appointment: none   Preventive Care 55 Years and Older, Female Preventive care refers to lifestyle choices and visits with your health care provider that can promote health and wellness. What does preventive care include?  A yearly physical exam. This is also called an annual well check.  Dental exams once or twice a year.  Routine eye exams. Ask your health care provider how often you should have your eyes checked.  Personal lifestyle choices, including:  Daily care of your teeth and gums.  Regular physical activity.  Eating a healthy diet.  Avoiding tobacco and drug use.  Limiting alcohol use.  Practicing safe sex.  Taking low-dose aspirin every day.  Taking vitamin and mineral supplements as recommended by your health care provider. What happens during an annual well check? The services and screenings done by your health care provider during your annual well check will depend on your age, overall health, lifestyle risk factors, and family history of  disease. Counseling  Your health care provider may ask you questions about your:  Alcohol use.  Tobacco use.  Drug use.  Emotional well-being.  Home and relationship well-being.  Sexual activity.  Eating habits.  History of falls.  Memory and ability to understand (cognition).  Work and work Statistician.  Reproductive health. Screening  You may have the following tests or measurements:  Height, weight, and BMI.  Blood pressure.  Lipid and cholesterol levels. These may be checked every 5 years, or more frequently if you are over 30 years old.  Skin check.  Lung cancer screening. You may have this screening every year starting at age 42 if you have a 30-pack-year history of smoking and currently smoke or have quit within the past 15 years.  Fecal occult blood test (FOBT) of the stool. You may have this test every year starting at age 47.  Flexible sigmoidoscopy or colonoscopy. You may have a sigmoidoscopy every 5 years or a colonoscopy every 10 years starting at age 70.  Hepatitis C blood test.  Hepatitis B blood test.  Sexually transmitted disease (STD) testing.  Diabetes screening. This is done by checking your blood sugar (glucose) after you have not eaten for a while (fasting). You may have this done every 1-3 years.  Bone density scan. This is done to screen for osteoporosis. You may have this done starting at age 86.  Mammogram. This may be done every 1-2 years. Talk to your health care provider about how often you should have regular mammograms. Talk with your health care provider about your test results, treatment options, and if necessary, the need for more tests. Vaccines  Your health care provider may recommend certain vaccines, such as:  Influenza vaccine.  This is recommended every year.  Tetanus, diphtheria, and acellular pertussis (Tdap, Td) vaccine. You may need a Td booster every 10 years.  Zoster vaccine. You may need this after age  40.  Pneumococcal 13-valent conjugate (PCV13) vaccine. One dose is recommended after age 73.  Pneumococcal polysaccharide (PPSV23) vaccine. One dose is recommended after age 31. Talk to your health care provider about which screenings and vaccines you need and how often you need them. This information is not intended to replace advice given to you by your health care provider. Make sure you discuss any questions you have with your health care provider. Document Released: 08/30/2015 Document Revised: 04/22/2016 Document Reviewed: 06/04/2015 Elsevier Interactive Patient Education  2017 [place] Prevention in the Home Falls can cause injuries. They can happen to people of all ages. There are many things you can do to make your home safe and to help prevent falls. What can I do on the outside of my home?  Regularly fix the edges of walkways and driveways and fix any cracks.  Remove anything that might make you trip as you walk through a door, such as a raised step or threshold.  Trim any bushes or trees on the path to your home.  Use bright outdoor lighting.  Clear any walking paths of anything that might make someone trip, such as rocks or tools.  Regularly check to see if handrails are loose or broken. Make sure that both sides of any steps have handrails.  Any raised decks and porches should have guardrails on the edges.  Have any leaves, snow, or ice cleared regularly.  Use sand or salt on walking paths during winter.  Clean up any spills in your garage right away. This includes oil or grease spills. What can I do in the bathroom?  Use night lights.  Install grab bars by the toilet and in the tub and shower. Do not use towel bars as grab bars.  Use non-skid mats or decals in the tub or shower.  If you need to sit down in the shower, use a plastic, non-slip stool.  Keep the floor dry. Clean up any water that spills on the floor as soon as it happens.  Remove  soap buildup in the tub or shower regularly.  Attach bath mats securely with double-sided non-slip rug tape.  Do not have throw rugs and other things on the floor that can make you trip. What can I do in the bedroom?  Use night lights.  Make sure that you have a light by your bed that is easy to reach.  Do not use any sheets or blankets that are too big for your bed. They should not hang down onto the floor.  Have a firm chair that has side arms. You can use this for support while you get dressed.  Do not have throw rugs and other things on the floor that can make you trip. What can I do in the kitchen?  Clean up any spills right away.  Avoid walking on wet floors.  Keep items that you use a lot in easy-to-reach places.  If you need to reach something above you, use a strong step stool that has a grab bar.  Keep electrical cords out of the way.  Do not use floor polish or wax that makes floors slippery. If you must use wax, use non-skid floor wax.  Do not have throw rugs and other things on the floor that can make  you trip. What can I do with my stairs?  Do not leave any items on the stairs.  Make sure that there are handrails on both sides of the stairs and use them. Fix handrails that are broken or loose. Make sure that handrails are as long as the stairways.  Check any carpeting to make sure that it is firmly attached to the stairs. Fix any carpet that is loose or worn.  Avoid having throw rugs at the top or bottom of the stairs. If you do have throw rugs, attach them to the floor with carpet tape.  Make sure that you have a light switch at the top of the stairs and the bottom of the stairs. If you do not have them, ask someone to add them for you. What else can I do to help prevent falls?  Wear shoes that:  Do not have high heels.  Have rubber bottoms.  Are comfortable and fit you well.  Are closed at the toe. Do not wear sandals.  If you use a  stepladder:  Make sure that it is fully opened. Do not climb a closed stepladder.  Make sure that both sides of the stepladder are locked into place.  Ask someone to hold it for you, if possible.  Clearly mark and make sure that you can see:  Any grab bars or handrails.  First and last steps.  Where the edge of each step is.  Use tools that help you move around (mobility aids) if they are needed. These include:  Canes.  Walkers.  Scooters.  Crutches.  Turn on the lights when you go into a dark area. Replace any light bulbs as soon as they burn out.  Set up your furniture so you have a clear path. Avoid moving your furniture around.  If any of your floors are uneven, fix them.  If there are any pets around you, be aware of where they are.  Review your medicines with your doctor. Some medicines can make you feel dizzy. This can increase your chance of falling. Ask your doctor what other things that you can do to help prevent falls. This information is not intended to replace advice given to you by your health care provider. Make sure you discuss any questions you have with your health care provider. Document Released: 05/30/2009 Document Revised: 01/09/2016 Document Reviewed: 09/07/2014 Elsevier Interactive Patient Education  2017 [employer].

## 2020-01-09 NOTE — Progress Notes (Signed)
PCP notes:  Health Maintenance: No gaps noted    Abnormal Screenings: none   Patient concerns: none   Nurse concerns: none   Next PCP appt: none 

## 2020-01-09 NOTE — Progress Notes (Signed)
Subjective:   Anita Buckley is a 69 y.o. female who presents for an Initial Medicare Annual Wellness Visit.  Review of Systems: N/A      I connected with the patient today by telephone and verified that I am speaking with the correct person using two identifiers. Location patient: home Location nurse: work Persons participating in the virtual visit: patient, Marine scientist.   I discussed the limitations, risks, security and privacy concerns of performing an evaluation and management service by telephone and the availability of in person appointments. I also discussed with the patient that there may be a patient responsible charge related to this service. The patient expressed understanding and verbally consented to this telephonic visit.    Interactive audio and video telecommunications were attempted between this nurse and patient, however failed, due to patient having technical difficulties OR patient did not have access to video capability.  We continued and completed visit with audio only.      Cardiac Risk Factors include: advanced age (>59men, >62 women);hypertension;Other (see comment), Risk factor comments: hypercholesterolemia     Objective:    Today's Vitals   There is no height or weight on file to calculate BMI.  Advanced Directives 01/09/2020 11/07/2019 10/05/2019 09/25/2019 09/05/2018 03/28/2018 12/13/2017  Does Patient Have a Medical Advance Directive? Yes Yes Yes Yes Yes Yes No  Type of Paramedic of [place];Living will [place];Living will [place];Living will [place];Living will [place];Living will - -  Does patient want to make changes to medical advance directive? - - No - Patient declined No - Patient declined No - Patient declined - -  Copy of [place] in Chart? No - copy requested No - copy requested No - copy requested No - copy requested No -  copy requested - -  Would patient like information on creating a medical advance directive? - - - - - - No - Patient declined    Current Medications (verified) Outpatient Encounter Medications as of 01/09/2020  Medication Sig  . alendronate (FOSAMAX) 70 MG tablet TAKE 1 TABLET BY MOUTH ONCE A WEEK WITH FULL GLASS OF WATER AND EMPTY STOMACH  . ALPRAZolam (XANAX) 0.5 MG tablet Take 1 tablet (0.5 mg total) by mouth daily as needed for anxiety.  Marland Kitchen amLODipine (NORVASC) 5 MG tablet Take 1 tablet (5 mg total) by mouth daily.  . Calcium Citrate-Vitamin D (CITRACAL + D PO) Take 1-2 capsules by mouth daily.   . diphenhydrAMINE (BENADRYL) 50 MG tablet Take 50 mg by mouth at bedtime as needed for itching.  Marland Kitchen EPINEPHrine 0.3 mg/0.3 mL IJ SOAJ injection INJECT 0.3 ML INTO MUSCLE AS NEEDED  . famotidine (PEPCID) 10 MG tablet Take 1 tablet (10 mg total) by mouth daily.  . Glucosamine-Chondroitin (MOVE FREE PO) Take 1 tablet by mouth 2 (two) times daily.  Marland Kitchen latanoprost (XALATAN) 0.005 % ophthalmic solution Place 1 drop into both eyes at bedtime.   No facility-administered encounter medications on file as of 01/09/2020.    Allergies (verified) Bee venom, Buspirone hcl, Minocycline, and Penicillins   History: Past Medical History:  Diagnosis Date  . Anxiety   . Cancer ([place])    B CELL LYMPHOMA  . Chronic lymphocytic leukemia (CLL), B-cell ([place]) 02/11/2016  . Depression   . History of anxiety   . History of depression   . History of fibrocystic disease of breast   . History of hyperlipidemia   .  Hypertension   . Situational stress    Past Surgical History:  Procedure Laterality Date  . COLONOSCOPY  12/2005   polyps, tics  . COLONOSCOPY WITH PROPOFOL N/A 05/11/2016   Procedure: COLONOSCOPY WITH PROPOFOL;  Surgeon: Manya Silvas, MD;  Location: [facility] ENDOSCOPY;  Service: Endoscopy;  Laterality: N/A;  . THYROIDECTOMY Left 08/20/2015   Procedure: THYROIDECTOMY/ HEMITHYROIDECTOMY;  Surgeon: Beverly Gust, MD;  Location: [facility] ORS;  Service: ENT;  Laterality: Left;  . TUBAL LIGATION     Family History  Problem Relation Age of Onset  . Diabetes Father        late in life  . Alcohol abuse Father   . Hyperlipidemia Mother   . Mental illness Mother   . Depression Sister   . Leukemia Brother   . Bladder Cancer Brother   . Schizophrenia Other        Grandfather  . Breast cancer Neg Hx    Social History   Socioeconomic History  . Marital status: Married    Spouse name: Not on file  . Number of children: Not on file  . Years of education: Not on file  . Highest education level: Not on file  Occupational History  . Not on file  Tobacco Use  . Smoking status: Never Smoker  . Smokeless tobacco: Never Used  Substance and Sexual Activity  . Alcohol use: Yes    Alcohol/week: 4.0 standard drinks    Types: 4 Glasses of wine per week    Comment: occ (wine with dinner)  . Drug use: No  . Sexual activity: Not on file  Other Topics Concern  . Not on file  Social History Narrative   Lives at home in private residence with husband   Social Determinants of Health   Financial Resource Strain:   . Difficulty of Paying Living Expenses:   Food Insecurity:   . Worried About Charity fundraiser in the Last Year:   . Arboriculturist in the Last Year:   Transportation Needs:   . Film/video editor (Medical):   Marland Kitchen Lack of Transportation (Non-Medical):   Physical Activity:   . Days of Exercise per Week:   . Minutes of Exercise per Session:   Stress:   . Feeling of Stress :   Social Connections:   . Frequency of Communication with Friends and Family:   . Frequency of Social Gatherings with Friends and Family:   . Attends Religious Services:   . Active Member of Clubs or Organizations:   . Attends Archivist Meetings:   Marland Kitchen Marital Status:     Tobacco Counseling Counseling given: Not Answered   Clinical Intake:  Pre-visit preparation completed: Yes  Pain :  No/denies pain     Nutritional Risks: None Diabetes: No  How often do you need to have someone help you when you read instructions, pamphlets, or other written materials from your doctor or pharmacy?: 1 - Never What is the last grade level you completed in school?: 1 year of college  Interpreter Needed?: No  Information entered by :: CJohnson, LPN   Activities of Daily Living In your present state of health, do you have any difficulty performing the following activities: 01/09/2020 11/07/2019  Hearing? N N  Vision? Y N  Comment has glaucoma -  Difficulty concentrating or making decisions? N N  Walking or climbing stairs? N N  Dressing or bathing? N N  Doing errands, shopping? N -  Preparing  Food and eating ? N -  Using the Toilet? N -  In the past six months, have you accidently leaked urine? N -  Do you have problems with loss of bowel control? N -  Managing your Medications? N -  Managing your Finances? N -  Housekeeping or managing your Housekeeping? N -  Some recent data might be hidden     Immunizations and Health Maintenance Immunization History  Administered Date(s) Administered  . Influenza Whole 05/08/2008, 06/17/2009  . Influenza-Unspecified 06/01/2015, 06/03/2017, 07/04/2018, 06/21/2019  . PFIZER SARS-COV-2 Vaccination 08/22/2019, 09/12/2019  . Pneumococcal Conjugate-13 03/01/2018  . Pneumococcal Polysaccharide-23 04/28/2019  . Td 08/26/2000, 08/18/2003  . Tdap 01/09/2014  . Zoster 06/01/2007  . Zoster Recombinat (Shingrix) 06/21/2019, 10/23/2019   There are no preventive care reminders to display for this patient.  Patient Care Team: Tower, Wynelle Fanny, MD as PCP - General  Indicate any recent Medical Services you may have received from other than [facility] providers in the past year (date may be approximate).     Assessment:   This is a routine wellness examination for Hani.  Hearing/Vision screen  Hearing Screening   125Hz  250Hz  500Hz  1000Hz  2000Hz   3000Hz  4000Hz  6000Hz  8000Hz   Right ear:           Left ear:           Vision Screening Comments: Patient gets annual eye exams  Dietary issues and exercise activities discussed: Current Exercise Habits: The patient does not participate in regular exercise at present, Exercise limited by: None identified  Goals    . Patient Stated     01/09/2020, I will continue to walk when weather permits.      Depression Screen PHQ 2/9 Scores 01/09/2020 03/01/2018 03/01/2017 01/09/2014 01/06/2013  PHQ - 2 Score 0 0 2 0 0  PHQ- 9 Score 0 - 8 - -    Fall Risk Fall Risk  01/09/2020 03/01/2018  Falls in the past year? 0 No  Number falls in past yr: 0 -  Injury with Fall? 0 -  Risk for fall due to : Medication side effect -  Follow up Falls evaluation completed;Falls prevention discussed -    Is the patient's home free of loose throw rugs in walkways, pet beds, electrical cords, etc?   yes      Grab bars in the bathroom? yes      Handrails on the stairs?   yes      Adequate lighting?   yes  Timed Get Up and Go Performed: N/A  Cognitive Function: MMSE - Mini Mental State Exam 01/09/2020  Orientation to time 5  Orientation to Place 5  Registration 3  Attention/ Calculation 5  Recall 3  Language- repeat 1       Mini Cog  Mini-Cog screen was completed. Maximum score is 22. A value of 0 denotes this part of the MMSE was not completed or the patient failed this part of the Mini-Cog screening.  Screening Tests Health Maintenance  Topic Date Due  . Hepatitis C Screening  09/18/2020 (Originally 1950-10-11)  . INFLUENZA VACCINE  03/17/2020  . MAMMOGRAM  12/10/2021  . TETANUS/TDAP  01/10/2024  . COLONOSCOPY  05/11/2026  . DEXA SCAN  Completed  . COVID-19 Vaccine  Completed  . PNA vac Low Risk Adult  Completed    Qualifies for Shingles Vaccine: Completed series  Cancer Screenings: Lung: Low Dose CT Chest recommended if Age 37-80 years, 30 pack-year currently smoking OR have quit  w/in 15  years. Patient does not qualify. Breast: Up to date on Mammogram: Yes, completed 12/11/2019  Up to date of Bone Density/Dexa: Yes, completed 03/28/2018 Colorectal: completed 05/11/2016  Additional Screenings:  Hepatitis C Screening: declined     Plan:   Patient will maintain and continue medications as prescribed.   I have personally reviewed and noted the following in the patient's chart:   . Medical and social history . Use of alcohol, tobacco or illicit drugs  . Current medications and supplements . Functional ability and status . Nutritional status . Physical activity . Advanced directives . List of other physicians . Hospitalizations, surgeries, and ER visits in previous 12 months . Vitals . Screenings to include cognitive, depression, and falls . Referrals and appointments  In addition, I have reviewed and discussed with patient certain preventive protocols, quality metrics, and best practice recommendations. A written personalized care plan for preventive services as well as general preventive health recommendations were provided to patient.     Andrez Grime, LPN   [id number]

## 2020-01-27 ENCOUNTER — Other Ambulatory Visit: Payer: Self-pay | Admitting: Family Medicine

## 2020-03-07 DIAGNOSIS — H401121 Primary open-angle glaucoma, left eye, mild stage: Secondary | ICD-10-CM | POA: Diagnosis not present

## 2020-03-31 DIAGNOSIS — Z23 Encounter for immunization: Secondary | ICD-10-CM | POA: Diagnosis not present

## 2020-04-03 ENCOUNTER — Other Ambulatory Visit: Payer: Self-pay | Admitting: Family Medicine

## 2020-04-04 ENCOUNTER — Ambulatory Visit: Payer: Medicare Other | Admitting: Oncology

## 2020-04-04 ENCOUNTER — Other Ambulatory Visit: Payer: Medicare Other

## 2020-04-05 ENCOUNTER — Encounter: Payer: Self-pay | Admitting: Family Medicine

## 2020-04-05 NOTE — Telephone Encounter (Signed)
It looks like she is due for a cpe  Please schedule when able

## 2020-04-05 NOTE — Telephone Encounter (Signed)
Name of Medication: Xanax Name of Pharmacy: [facility] [address] Henrietta Dine or Written Date and Quantity: 07/18/19 #30 tabs with 3 refills Last Office Visit and Type: ST on 05/12/19 (CPE on 04/04/19) Next Office Visit and Type: none scheduled  Other 2 meds are also due for a refill, please advise

## 2020-04-08 ENCOUNTER — Other Ambulatory Visit: Payer: Self-pay

## 2020-04-08 ENCOUNTER — Inpatient Hospital Stay (HOSPITAL_BASED_OUTPATIENT_CLINIC_OR_DEPARTMENT_OTHER): Payer: Medicare Other | Admitting: Oncology

## 2020-04-08 ENCOUNTER — Encounter: Payer: Self-pay | Admitting: Oncology

## 2020-04-08 ENCOUNTER — Inpatient Hospital Stay: Payer: Medicare Other | Attending: Oncology

## 2020-04-08 VITALS — BP 142/94 | HR 58 | Temp 98.3°F | Resp 16 | Ht 65.0 in | Wt 157.6 lb

## 2020-04-08 DIAGNOSIS — C8300 Small cell B-cell lymphoma, unspecified site: Secondary | ICD-10-CM | POA: Diagnosis not present

## 2020-04-08 DIAGNOSIS — R221 Localized swelling, mass and lump, neck: Secondary | ICD-10-CM | POA: Insufficient documentation

## 2020-04-08 DIAGNOSIS — Z79899 Other long term (current) drug therapy: Secondary | ICD-10-CM | POA: Insufficient documentation

## 2020-04-08 DIAGNOSIS — F418 Other specified anxiety disorders: Secondary | ICD-10-CM | POA: Insufficient documentation

## 2020-04-08 DIAGNOSIS — Z803 Family history of malignant neoplasm of breast: Secondary | ICD-10-CM | POA: Diagnosis not present

## 2020-04-08 DIAGNOSIS — I10 Essential (primary) hypertension: Secondary | ICD-10-CM | POA: Diagnosis not present

## 2020-04-08 DIAGNOSIS — Z8052 Family history of malignant neoplasm of bladder: Secondary | ICD-10-CM | POA: Insufficient documentation

## 2020-04-08 DIAGNOSIS — E785 Hyperlipidemia, unspecified: Secondary | ICD-10-CM | POA: Diagnosis not present

## 2020-04-08 DIAGNOSIS — C9110 Chronic lymphocytic leukemia of B-cell type not having achieved remission: Secondary | ICD-10-CM | POA: Diagnosis not present

## 2020-04-08 DIAGNOSIS — D509 Iron deficiency anemia, unspecified: Secondary | ICD-10-CM | POA: Insufficient documentation

## 2020-04-08 LAB — COMPREHENSIVE METABOLIC PANEL
ALT: 28 U/L (ref 0–44)
AST: 24 U/L (ref 15–41)
Albumin: 4.4 g/dL (ref 3.5–5.0)
Alkaline Phosphatase: 35 U/L — ABNORMAL LOW (ref 38–126)
Anion gap: 5 (ref 5–15)
BUN: 21 mg/dL (ref 8–23)
CO2: 27 mmol/L (ref 22–32)
Calcium: 8.6 mg/dL — ABNORMAL LOW (ref 8.9–10.3)
Chloride: 105 mmol/L (ref 98–111)
Creatinine, Ser: 0.7 mg/dL (ref 0.44–1.00)
GFR calc Af Amer: >60 mL/min (ref >60)
GFR calc non Af Amer: >60 mL/min (ref >60)
Glucose, Bld: 93 mg/dL (ref 70–99)
Potassium: 4.3 mmol/L (ref 3.5–5.1)
Sodium: 137 mmol/L (ref 135–145)
Total Bilirubin: 0.6 mg/dL (ref 0.3–1.2)
Total Protein: 7.2 g/dL (ref 6.5–8.1)

## 2020-04-08 LAB — CBC WITH DIFFERENTIAL/PLATELET
Abs Immature Granulocytes: 0.02 10*3/uL (ref 0.00–0.07)
Basophils Absolute: 0.1 10*3/uL (ref 0.0–0.1)
Basophils Relative: 1 %
Eosinophils Absolute: 0.2 10*3/uL (ref 0.0–0.5)
Eosinophils Relative: 3 %
HCT: 38.4 % (ref 36.0–46.0)
Hemoglobin: 12.8 g/dL (ref 12.0–15.0)
Immature Granulocytes: 0 %
Lymphocytes Relative: 52 %
Lymphs Abs: 5 10*3/uL — ABNORMAL HIGH (ref 0.7–4.0)
MCH: 29.3 pg (ref 26.0–34.0)
MCHC: 33.3 g/dL (ref 30.0–36.0)
MCV: 87.9 fL (ref 80.0–100.0)
Monocytes Absolute: 0.7 10*3/uL (ref 0.1–1.0)
Monocytes Relative: 8 %
Neutro Abs: 3.4 10*3/uL (ref 1.7–7.7)
Neutrophils Relative %: 36 %
Platelets: 194 10*3/uL (ref 150–400)
RBC: 4.37 MIL/uL (ref 3.87–5.11)
RDW: 12.4 % (ref 11.5–15.5)
Smear Review: NORMAL
WBC: 9.4 10*3/uL (ref 4.0–10.5)
nRBC: 0 % (ref 0.0–0.2)

## 2020-04-08 LAB — LACTATE DEHYDROGENASE: LDH: 130 U/L (ref 98–192)

## 2020-04-08 NOTE — Progress Notes (Signed)
Pt doing good from SLL, no infections, no itching, no swollen lymph nodes. Eats good, bm good. She takes care of her husband who has a chronic disease so she gets tired from caring for him on physical basis.

## 2020-04-10 NOTE — Progress Notes (Signed)
Hematology/Oncology Consult note [facility]  [phone] [phone]  Patient Care Team: Abner Greenspan, MD as PCP - General   Name of the patient: Anita Buckley  [id number]  1951/02/20   Date of visit: 04/10/20  Diagnosis- iron deficiency anemia  Chief complaint/ Reason for visit- routine f/u of iron deficiency anemia  Heme/Onc history:  Patient is a 69 year old female who was diagnosed with SLL back in 2015. She was having some problems with her thyroid gland back then an ultrasound of the soft tissue head and neck in September 2015 revealed homogeneous thyroid gland with several thyroid nodules. Several enlarged left-sided neck lymph nodes. FNA of the left neck no was consistent with clonal population of lymphocytes CD5 positive, CD23 positive and CD43 8+. It was negative for malignant cells. She also had a PET scan subsequently and her last PET scan was in 2016 which showed stable mild metabolism within a 1.2 cm level 3 lymph node but no evidence of hypermetabolism or significant adenopathy elsewhere. CT chest in February 2018 revealed mild thoracic lymphadenopathy compatible with CLL. She has not had any peripheral blood lymphocytosis  Interval history-patient reports that her appetite and weight is doing well.  She has stable left neck swelling which is not increased in size and not bothering her.  Denies any fatigue or unintentional weight loss or drenching night sweats.  ECOG PS- 0 Pain scale- 0   Review of systems- Review of Systems  Constitutional: Negative for chills, fever and weight loss.  HENT: Negative for congestion, ear discharge and nosebleeds.   Eyes: Negative for blurred vision.  Respiratory: Negative for cough, hemoptysis, sputum production, shortness of breath and wheezing.   Cardiovascular: Negative for chest pain, palpitations, orthopnea and claudication.  Gastrointestinal: Negative for abdominal pain,  blood in stool, constipation, diarrhea, heartburn, melena, nausea and vomiting.  Genitourinary: Negative for dysuria, flank pain, frequency, hematuria and urgency.  Musculoskeletal: Negative for back pain, joint pain and myalgias.  Skin: Negative for rash.  Neurological: Negative for dizziness, tingling, focal weakness, seizures, weakness and headaches.  Endo/Heme/Allergies: Does not bruise/bleed easily.  Psychiatric/Behavioral: Negative for depression and suicidal ideas. The patient does not have insomnia.       Allergies  Allergen Reactions  . Bee Venom     Anaphylaxis   . Buspirone Hcl     REACTION: did not like  . Minocycline Nausea And Vomiting  . Penicillins     REACTION: reaction as a child     Past Medical History:  Diagnosis Date  . Anxiety   . Cancer ([place])    B CELL LYMPHOMA  . Chronic lymphocytic leukemia (CLL), B-cell ([place]) 02/11/2016  . Depression   . History of anxiety   . History of depression   . History of fibrocystic disease of breast   . History of hyperlipidemia   . Hypertension   . Situational stress      Past Surgical History:  Procedure Laterality Date  . COLONOSCOPY  12/2005   polyps, tics  . COLONOSCOPY WITH PROPOFOL N/A 05/11/2016   Procedure: COLONOSCOPY WITH PROPOFOL;  Surgeon: Manya Silvas, MD;  Location: [facility] ENDOSCOPY;  Service: Endoscopy;  Laterality: N/A;  . THYROIDECTOMY Left 08/20/2015   Procedure: THYROIDECTOMY/ HEMITHYROIDECTOMY;  Surgeon: Beverly Gust, MD;  Location: [facility] ORS;  Service: ENT;  Laterality: Left;  . TUBAL LIGATION      Social History   Socioeconomic History  . Marital status: Married    Spouse  name: Not on file  . Number of children: Not on file  . Years of education: Not on file  . Highest education level: Not on file  Occupational History  . Not on file  Tobacco Use  . Smoking status: Never Smoker  . Smokeless tobacco: Never Used  Vaping Use  . Vaping Use: Never used  Substance and Sexual  Activity  . Alcohol use: Yes    Alcohol/week: 4.0 standard drinks    Types: 4 Glasses of wine per week    Comment: occ (wine with dinner)  . Drug use: No  . Sexual activity: Not on file  Other Topics Concern  . Not on file  Social History Narrative   Lives at home in private residence with husband   Social Determinants of Health   Financial Resource Strain:   . Difficulty of Paying Living Expenses: Not on file  Food Insecurity:   . Worried About Charity fundraiser in the Last Year: Not on file  . Ran Out of Food in the Last Year: Not on file  Transportation Needs:   . Lack of Transportation (Medical): Not on file  . Lack of Transportation (Non-Medical): Not on file  Physical Activity:   . Days of Exercise per Week: Not on file  . Minutes of Exercise per Session: Not on file  Stress:   . Feeling of Stress : Not on file  Social Connections:   . Frequency of Communication with Friends and Family: Not on file  . Frequency of Social Gatherings with Friends and Family: Not on file  . Attends Religious Services: Not on file  . Active Member of Clubs or Organizations: Not on file  . Attends Archivist Meetings: Not on file  . Marital Status: Not on file  Intimate Partner Violence:   . Fear of Current or Ex-Partner: Not on file  . Emotionally Abused: Not on file  . Physically Abused: Not on file  . Sexually Abused: Not on file    Family History  Problem Relation Age of Onset  . Diabetes Father        late in life  . Alcohol abuse Father   . Hyperlipidemia Mother   . Mental illness Mother   . Depression Sister   . Leukemia Brother   . Bladder Cancer Brother   . Schizophrenia Other        Grandfather  . Breast cancer Neg Hx      Current Outpatient Medications:  .  alendronate (FOSAMAX) 70 MG tablet, TAKE 1 TABLET BY MOUTH ONCE A WEEK WITH FULL GLASS OF WATER AND EMPTY STOMACH, Disp: 12 tablet, Rfl: 3 .  ALPRAZolam (XANAX) 0.5 MG tablet, TAKE 1 TABLET BY  MOUTH EVERY DAY AS NEEDED FOR ANXIETY, Disp: 30 tablet, Rfl: 1 .  amLODipine (NORVASC) 5 MG tablet, TAKE 1 TABLET BY MOUTH EVERY DAY, Disp: 90 tablet, Rfl: 0 .  Calcium Citrate-Vitamin D (CITRACAL + D PO), Take 1-2 capsules by mouth daily. , Disp: , Rfl:  .  EPINEPHrine 0.3 mg/0.3 mL IJ SOAJ injection, INJECT 0.3 ML INTO MUSCLE AS NEEDED, Disp: 2 each, Rfl: 3 .  famotidine (PEPCID) 10 MG tablet, Take 1 tablet (10 mg total) by mouth daily., Disp: , Rfl:  .  Glucosamine-Chondroitin (MOVE FREE PO), Take 2 tablets by mouth daily. , Disp: , Rfl:  .  ibuprofen (ADVIL) 200 MG tablet, Take 200 mg by mouth every 4 (four) hours as needed., Disp: , Rfl:  .  latanoprost (XALATAN) 0.005 % ophthalmic solution, Place 1 drop into both eyes at bedtime., Disp: , Rfl:  .  lidocaine (XYLOCAINE) 5 % ointment, Apply 1 application topically as needed (for bursitis)., Disp: , Rfl:  .  timolol (BETIMOL) 0.25 % ophthalmic solution, Place 1 drop into both eyes daily., Disp: , Rfl:   Physical exam:  Vitals:   04/08/20 1403  BP: (!) 142/94  Pulse: (!) 58  Resp: 16  Temp: 98.3 F (36.8 C)  TempSrc: Oral  Weight: 157 lb 9.6 oz (71.5 kg)  Height: 5\' 5"  (1.651 m)   Physical Exam Constitutional:      General: She is not in acute distress. HENT:     Head: Normocephalic.  Cardiovascular:     Heart sounds: Normal heart sounds.  Pulmonary:     Effort: Pulmonary effort is normal.     Breath sounds: Normal breath sounds.  Abdominal:     General: Bowel sounds are normal.     Palpations: Abdomen is soft.     Comments: No palpable hepatosplenomegaly  Lymphadenopathy:     Comments: Palpable left supraclavicular LN.  No palpable bilateral axillary or inguinal adenopathy  Skin:    General: Skin is warm and dry.  Neurological:     Mental Status: She is alert and oriented to person, place, and time.      CMP Latest Ref Rng & Units 04/08/2020  Glucose 70 - 99 mg/dL 93  BUN 8 - 23 mg/dL 21  Creatinine 0.44 - 1.00  mg/dL 0.70  Sodium 135 - 145 mmol/L 137  Potassium 3.5 - 5.1 mmol/L 4.3  Chloride 98 - 111 mmol/L 105  CO2 22 - 32 mmol/L 27  Calcium 8.9 - 10.3 mg/dL 8.6(L)  Total Protein 6.5 - 8.1 g/dL 7.2  Total Bilirubin 0.3 - 1.2 mg/dL 0.6  Alkaline Phos 38 - 126 U/L 35(L)  AST 15 - 41 U/L 24  ALT 0 - 44 U/L 28   CBC Latest Ref Rng & Units 04/08/2020  WBC 4.0 - 10.5 K/uL 9.4  Hemoglobin 12.0 - 15.0 g/dL 12.8  Hematocrit 36 - 46 % 38.4  Platelets 150 - 400 K/uL 194    Assessment and plan- Patient is a 69 y.o. female with history of SLL here for routine follow-up  Patient CBC has remained stable with a normal white cell count hemoglobin and platelets although she has mild lymphocytosis from her CLL/SLL.  No significant B symptoms.  Patient also recently had a PET CT scan in February 2021 which again showed mild increase in the size of left supraclavicular lymph node to 1.9 cm mild bilateral cervical adenopathy and axillary adenopathy all of which were nonbulky.  Mild intra-abdominal adenopathy.  She is relatively asymptomatic from this and does not require any treatment.  She was also noted to have a new right-sided thyroid hypermetabolism.  She did undergo ultrasound of the thyroid as well as subsequent biopsies at the same time she also underwent left cervical lymph node biopsy which was consistent with SLL but no histological transformation.  Thyroid biopsy was benign follicular nodule.  At this time I am inclined to monitor her SLL conservatively without any need for treatment at this time.I will see her back in 1 years time with CBC with differential CMP and LDH.  Patient knows to call us if she has any worsening the symptoms or bulky adenopathy on self palpation   Visit Diagnosis 1. Lymphoma, small lymphocytic ([place])      Dr.  Randa Evens, MD, MPH [place] [facility] [phone] 04/10/2020 11:27 AM

## 2020-04-11 ENCOUNTER — Encounter: Payer: Self-pay | Admitting: Oncology

## 2020-04-18 DIAGNOSIS — H40001 Preglaucoma, unspecified, right eye: Secondary | ICD-10-CM | POA: Diagnosis not present

## 2020-05-20 DIAGNOSIS — Z23 Encounter for immunization: Secondary | ICD-10-CM | POA: Diagnosis not present

## 2020-07-26 ENCOUNTER — Other Ambulatory Visit: Payer: Self-pay

## 2020-07-29 ENCOUNTER — Encounter: Payer: Self-pay | Admitting: Family Medicine

## 2020-07-29 ENCOUNTER — Other Ambulatory Visit: Payer: Self-pay

## 2020-07-29 ENCOUNTER — Ambulatory Visit (INDEPENDENT_AMBULATORY_CARE_PROVIDER_SITE_OTHER): Payer: Medicare Other | Admitting: Family Medicine

## 2020-07-29 VITALS — BP 135/88 | HR 64 | Temp 97.0°F | Ht 65.25 in | Wt 152.5 lb

## 2020-07-29 DIAGNOSIS — I10 Essential (primary) hypertension: Secondary | ICD-10-CM | POA: Diagnosis not present

## 2020-07-29 DIAGNOSIS — E2839 Other primary ovarian failure: Secondary | ICD-10-CM | POA: Diagnosis not present

## 2020-07-29 DIAGNOSIS — C9110 Chronic lymphocytic leukemia of B-cell type not having achieved remission: Secondary | ICD-10-CM | POA: Diagnosis not present

## 2020-07-29 DIAGNOSIS — M8580 Other specified disorders of bone density and structure, unspecified site: Secondary | ICD-10-CM

## 2020-07-29 DIAGNOSIS — E7800 Pure hypercholesterolemia, unspecified: Secondary | ICD-10-CM | POA: Diagnosis not present

## 2020-07-29 DIAGNOSIS — R7303 Prediabetes: Secondary | ICD-10-CM

## 2020-07-29 MED ORDER — AMLODIPINE BESYLATE 5 MG PO TABS: 5.0000 mg | ORAL_TABLET | Freq: Every day | ORAL | 3 refills | Status: DC

## 2020-07-29 NOTE — Assessment & Plan Note (Signed)
Continues oncology care  No new LN noted

## 2020-07-29 NOTE — Progress Notes (Signed)
Subjective:    Patient ID: Anita Buckley, female    DOB: 1951/01/20, 69 y.o.   MRN: [id number]  This visit occurred during the SARS-CoV-2 public health emergency.  Safety protocols were in place, including screening questions prior to the visit, additional usage of staff PPE, and extensive cleaning of exam room while observing appropriate contact time as indicated for disinfecting solutions.    HPI Pt presents for annual f/u of chronic health problems   Wt Readings from Last 3 Encounters:  07/29/20 152 lb 8 oz (69.2 kg)  04/08/20 157 lb 9.6 oz (71.5 kg)  11/07/19 152 lb (68.9 kg)   25.18 kg/m  Feeling good overall  Taking care of herself   HTN bp is stable today  No cp or palpitations or headaches or edema  No side effects to medicines  BP Readings from Last 3 Encounters:  07/29/20 135/88  04/08/20 (!) 142/94  11/07/19 106/64    Takes amlodipine 5 mg daily  Per pt-at home diastolic was 90 last time   Pulse Readings from Last 3 Encounters:  07/29/20 64  04/08/20 (!) 58  11/07/19 73   Osteopenia dexa 8/19 - due for a recall Taking fosamax since 2016  No falls  No fractures  Taking vit D and sometimes ca    Hyperlipidemia Lab Results  Component Value Date   CHOL 214 (H) 03/01/2018   HDL 64.30 03/01/2018   LDLCALC 127 (H) 03/01/2018   LDLDIRECT 126.4 06/28/2012   TRIG 115.0 03/01/2018   CHOLHDL 3 03/01/2018   Due for labs Diet has been fair  Eats what she wants but eats pretty healthy  Some salt and butter  No fast food  Not much beef   Lab Results  Component Value Date   HGBA1C 5.8 03/01/2018   Prediabetes  Not a big sugar eater   CLL -oncol f/u in aug  Was able to take a nodule down in her neck  Noted a b9 thyroid nodule   Lab Results  Component Value Date   WBC 9.4 04/08/2020   HGB 12.8 04/08/2020   HCT 38.4 04/08/2020   MCV 87.9 04/08/2020   PLT 194 04/08/2020   covid immunized [facility]  Had flu shot   Patient Active  Problem List   Diagnosis Date Noted  . Thyroid nodule 10/12/2019  . Welcome to Medicare preventive visit 03/01/2018  . Tendinitis of hand 02/08/2018  . Glaucoma (increased eye pressure) 07/04/2017  . Encounter for annual routine gynecological examination 03/01/2017  . Chronic lymphocytic leukemia (CLL), B-cell ([place]) 02/11/2016  . Colon cancer screening 01/25/2015  . Estrogen deficiency 01/25/2015  . Lymphoma ([place]) 06/04/2014  . Osteopenia 01/06/2013  . HTN (hypertension), benign 06/28/2012  . Other screening mammogram 01/05/2012  . Gynecological examination 01/05/2012  . Post-menopause 01/05/2012  . Routine general medical examination at a health care facility 12/31/2011  . PURE HYPERCHOLESTEROLEMIA 08/15/2008  . ANXIETY 12/08/2007  . FIBROCYSTIC BREAST DISEASE 03/30/2007  . Prediabetes 03/30/2007   Past Medical History:  Diagnosis Date  . Anxiety   . Cancer ([place])    B CELL LYMPHOMA  . Chronic lymphocytic leukemia (CLL), B-cell ([place]) 02/11/2016  . Depression   . History of anxiety   . History of depression   . History of fibrocystic disease of breast   . History of hyperlipidemia   . Hypertension   . Situational stress    Past Surgical History:  Procedure Laterality Date  . COLONOSCOPY  12/2005  polyps, tics  . COLONOSCOPY WITH PROPOFOL N/A 05/11/2016   Procedure: COLONOSCOPY WITH PROPOFOL;  Surgeon: Manya Silvas, MD;  Location: [facility] ENDOSCOPY;  Service: Endoscopy;  Laterality: N/A;  . THYROIDECTOMY Left 08/20/2015   Procedure: THYROIDECTOMY/ HEMITHYROIDECTOMY;  Surgeon: Beverly Gust, MD;  Location: [facility] ORS;  Service: ENT;  Laterality: Left;  . TUBAL LIGATION     Social History   Tobacco Use  . Smoking status: Never Smoker  . Smokeless tobacco: Never Used  Vaping Use  . Vaping Use: Never used  Substance Use Topics  . Alcohol use: Yes    Alcohol/week: 4.0 standard drinks    Types: 4 Glasses of wine per week    Comment: occ (wine with dinner)  . Drug use:  No   Family History  Problem Relation Age of Onset  . Diabetes Father        late in life  . Alcohol abuse Father   . Hyperlipidemia Mother   . Mental illness Mother   . Depression Sister   . Leukemia Brother   . Bladder Cancer Brother   . Schizophrenia Other        Grandfather  . Breast cancer Neg Hx    Allergies  Allergen Reactions  . Bee Venom     Anaphylaxis   . Buspirone Hcl     REACTION: did not like  . Minocycline Nausea And Vomiting  . Penicillins     REACTION: reaction as a child   Current Outpatient Medications on File Prior to Visit  Medication Sig Dispense Refill  . ALPRAZolam (XANAX) 0.5 MG tablet TAKE 1 TABLET BY MOUTH EVERY DAY AS NEEDED FOR ANXIETY 30 tablet 1  . Calcium Citrate-Vitamin D (CITRACAL + D PO) Take 1-2 capsules by mouth daily.     Marland Kitchen EPINEPHrine 0.3 mg/0.3 mL IJ SOAJ injection INJECT 0.3 ML INTO MUSCLE AS NEEDED 2 each 3  . famotidine (PEPCID) 10 MG tablet Take 1 tablet (10 mg total) by mouth daily.    . Glucosamine-Chondroitin (MOVE FREE PO) Take 2 tablets by mouth daily.     Marland Kitchen ibuprofen (ADVIL) 200 MG tablet Take 200 mg by mouth every 4 (four) hours as needed.    . latanoprost (XALATAN) 0.005 % ophthalmic solution Place 1 drop into both eyes at bedtime.    . timolol (BETIMOL) 0.25 % ophthalmic solution Place 1 drop into both eyes daily.     No current facility-administered medications on file prior to visit.     Review of Systems  Constitutional: Negative for activity change, appetite change, fatigue, fever and unexpected weight change.  HENT: Negative for congestion, ear pain, rhinorrhea, sinus pressure and sore throat.   Eyes: Negative for pain, redness and visual disturbance.  Respiratory: Negative for cough, shortness of breath and wheezing.   Cardiovascular: Negative for chest pain and palpitations.  Gastrointestinal: Negative for abdominal pain, blood in stool, constipation and diarrhea.  Endocrine: Negative for polydipsia and  polyuria.  Genitourinary: Negative for dysuria, frequency and urgency.  Musculoskeletal: Negative for arthralgias, back pain and myalgias.  Skin: Negative for pallor and rash.  Allergic/Immunologic: Negative for environmental allergies.  Neurological: Negative for dizziness, syncope and headaches.  Hematological: Negative for adenopathy. Does not bruise/bleed easily.  Psychiatric/Behavioral: Negative for decreased concentration and dysphoric mood. The patient is not nervous/anxious.        Objective:   Physical Exam Constitutional:      General: She is not in acute distress.    Appearance: Normal appearance.  She is well-developed. She is not ill-appearing or diaphoretic.  HENT:     Head: Normocephalic and atraumatic.     Right Ear: Tympanic membrane, ear canal and external ear normal.     Left Ear: Tympanic membrane, ear canal and external ear normal.     Nose: Nose normal. No congestion.     Mouth/Throat:     Mouth: Mucous membranes are moist.     Pharynx: Oropharynx is clear. No posterior oropharyngeal erythema.  Eyes:     General: No scleral icterus.    Extraocular Movements: Extraocular movements intact.     Conjunctiva/sclera: Conjunctivae normal.     Pupils: Pupils are equal, round, and reactive to light.  Neck:     Thyroid: No thyromegaly.     Vascular: No carotid bruit or JVD.  Cardiovascular:     Rate and Rhythm: Normal rate and regular rhythm.     Pulses: Normal pulses.     Heart sounds: Normal heart sounds. No gallop.   Pulmonary:     Effort: Pulmonary effort is normal. No respiratory distress.     Breath sounds: Normal breath sounds. No wheezing.     Comments: Good air exch Chest:     Chest wall: No tenderness.  Abdominal:     General: Bowel sounds are normal. There is no distension or abdominal bruit.     Palpations: Abdomen is soft. There is no mass.     Tenderness: There is no abdominal tenderness.     Hernia: No hernia is present.  Musculoskeletal:         General: No tenderness. Normal range of motion.     Cervical back: Normal range of motion and neck supple. No rigidity. No muscular tenderness.     Right lower leg: No edema.     Left lower leg: No edema.  Lymphadenopathy:     Cervical: No cervical adenopathy.  Skin:    General: Skin is warm and dry.     Coloration: Skin is not pale.     Findings: No erythema or rash.     Comments: Solar lentigines diffusely   Neurological:     Mental Status: She is alert. Mental status is at baseline.     Cranial Nerves: No cranial nerve deficit.     Motor: No abnormal muscle tone.     Coordination: Coordination normal.     Gait: Gait normal.     Deep Tendon Reflexes: Reflexes are normal and symmetric. Reflexes normal.  Psychiatric:        Mood and Affect: Mood normal.        Cognition and Memory: Cognition and memory normal.           Assessment & Plan:   Problem List Items Addressed This Visit      Cardiovascular and Mediastinum   HTN (hypertension), benign - Primary    bp in fair control at this time  BP Readings from Last 1 Encounters:  07/29/20 135/88   No changes needed Most recent labs reviewed  Disc lifstyle change with low sodium diet and exercise  bp at home has been borderline  Disc proper way to check and will continue to monitor Consider addn tx if over 140/90 consistently  In tx range today Plan to continue amlodipine 5 mg daily      Relevant Medications   amLODipine (NORVASC) 5 MG tablet   Other Relevant Orders   CBC with Differential/Platelet   Comprehensive metabolic panel   Lipid panel  TSH     Musculoskeletal and Integument   Osteopenia    Rev dexa 2019 Order done for 2 y recall  No falls or fx  Finished 5 y course of alendronate-inst to stop it (drug holiday)   Disc need for calcium/ vitamin D/ wt bearing exercise and bone density test every 2 y to monitor Disc safety/ fracture risk in detail          Other   PURE HYPERCHOLESTEROLEMIA     Disc goals for lipids and reasons to control them Rev last labs with pt Rev low sat fat diet in detail  Labs drawn today  Pt eats small portions but does not often watch fat      Relevant Medications   amLODipine (NORVASC) 5 MG tablet   Other Relevant Orders   Lipid panel   Prediabetes    Due for a1c Mindful of sugar in diet  Encouraged exercise  disc imp of low glycemic diet and wt loss to prevent DM2       Relevant Orders   Hemoglobin A1c   Estrogen deficiency   Relevant Orders   DG Bone Density   Chronic lymphocytic leukemia (CLL), B-cell ([place])    Continues oncology care  No new LN noted

## 2020-07-29 NOTE — Assessment & Plan Note (Signed)
Due for a1c Mindful of sugar in diet  Encouraged exercise  disc imp of low glycemic diet and wt loss to prevent DM2

## 2020-07-29 NOTE — Assessment & Plan Note (Signed)
Rev dexa 2019 Order done for 2 y recall  No falls or fx  Finished 5 y course of alendronate-inst to stop it (drug holiday)   Disc need for calcium/ vitamin D/ wt bearing exercise and bone density test every 2 y to monitor Disc safety/ fracture risk in detail

## 2020-07-29 NOTE — Patient Instructions (Addendum)
Stop your fosamax-the course is done for now  Bone density test is ordered -you can call to schedule    For cholesterol  Avoid red meat/ fried foods/ egg yolks/ fatty breakfast meats/ butter, cheese and high fat dairy/ and shellfish   Check BP at home when relaxed  If consistently over 140 on top and 90 on the bottom let me know

## 2020-07-29 NOTE — Assessment & Plan Note (Signed)
bp in fair control at this time  BP Readings from Last 1 Encounters:  07/29/20 135/88   No changes needed Most recent labs reviewed  Disc lifstyle change with low sodium diet and exercise  bp at home has been borderline  Disc proper way to check and will continue to monitor Consider addn tx if over 140/90 consistently  In tx range today Plan to continue amlodipine 5 mg daily

## 2020-07-29 NOTE — Assessment & Plan Note (Signed)
Disc goals for lipids and reasons to control them Rev last labs with pt Rev low sat fat diet in detail  Labs drawn today  Pt eats small portions but does not often watch fat

## 2020-07-30 LAB — COMPREHENSIVE METABOLIC PANEL
ALT: 20 U/L (ref 0–35)
AST: 18 U/L (ref 0–37)
Albumin: 4.6 g/dL (ref 3.5–5.2)
Alkaline Phosphatase: 50 U/L (ref 39–117)
BUN: 20 mg/dL (ref 6–23)
CO2: 27 mEq/L (ref 19–32)
Calcium: 9.2 mg/dL (ref 8.4–10.5)
Chloride: 103 mEq/L (ref 96–112)
Creatinine, Ser: 0.69 mg/dL (ref 0.40–1.20)
GFR: 89.59 mL/min (ref >60.00)
Glucose, Bld: 86 mg/dL (ref 70–99)
Potassium: 3.9 mEq/L (ref 3.5–5.1)
Sodium: 139 mEq/L (ref 135–145)
Total Bilirubin: 0.4 mg/dL (ref 0.2–1.2)
Total Protein: 6.9 g/dL (ref 6.0–8.3)

## 2020-07-30 LAB — CBC WITH DIFFERENTIAL/PLATELET
Basophils Absolute: 0.2 10*3/uL — ABNORMAL HIGH (ref 0.0–0.1)
Basophils Relative: 1.2 % (ref 0.0–3.0)
Eosinophils Absolute: 0.2 10*3/uL (ref 0.0–0.7)
Eosinophils Relative: 1.3 % (ref 0.0–5.0)
HCT: 40.3 % (ref 36.0–46.0)
Hemoglobin: 13.3 g/dL (ref 12.0–15.0)
Lymphocytes Relative: 45.8 % (ref 12.0–46.0)
Lymphs Abs: 5.7 10*3/uL — ABNORMAL HIGH (ref 0.7–4.0)
MCHC: 33.1 g/dL (ref 30.0–36.0)
MCV: 88.2 fl (ref 78.0–100.0)
Monocytes Absolute: 0.9 10*3/uL (ref 0.1–1.0)
Monocytes Relative: 7.2 % (ref 3.0–12.0)
Neutro Abs: 5.5 10*3/uL (ref 1.4–7.7)
Neutrophils Relative %: 44.5 % (ref 43.0–77.0)
Platelets: 207 10*3/uL (ref 150.0–400.0)
RBC: 4.57 Mil/uL (ref 3.87–5.11)
RDW: 13.3 % (ref 11.5–15.5)
WBC: 12.5 10*3/uL — ABNORMAL HIGH (ref 4.0–10.5)

## 2020-07-30 LAB — LIPID PANEL
Cholesterol: 238 mg/dL — ABNORMAL HIGH (ref 0–200)
HDL: 62.2 mg/dL (ref >39.00)
LDL Cholesterol: 148 mg/dL — ABNORMAL HIGH (ref 0–99)
NonHDL: 175.98
Total CHOL/HDL Ratio: 4
Triglycerides: 140 mg/dL (ref 0.0–149.0)
VLDL: 28 mg/dL (ref 0.0–40.0)

## 2020-07-30 LAB — HEMOGLOBIN A1C: Hgb A1c MFr Bld: 5.7 % (ref 4.6–6.5)

## 2020-07-30 LAB — TSH: TSH: 3.27 u[IU]/mL (ref 0.35–4.50)

## 2020-09-04 ENCOUNTER — Other Ambulatory Visit: Payer: Self-pay

## 2020-09-04 ENCOUNTER — Ambulatory Visit
Admission: RE | Admit: 2020-09-04 | Discharge: 2020-09-04 | Disposition: A | Discharge status: Home / Self Care | Payer: Medicare Other | Source: Ambulatory Visit | Attending: Family Medicine | Admitting: Family Medicine

## 2020-09-04 DIAGNOSIS — E2839 Other primary ovarian failure: Secondary | ICD-10-CM | POA: Diagnosis not present

## 2020-09-04 DIAGNOSIS — M8589 Other specified disorders of bone density and structure, multiple sites: Secondary | ICD-10-CM | POA: Diagnosis not present

## 2020-09-04 DIAGNOSIS — Z8781 Personal history of (healed) traumatic fracture: Secondary | ICD-10-CM | POA: Diagnosis not present

## 2020-09-04 DIAGNOSIS — Z780 Asymptomatic menopausal state: Secondary | ICD-10-CM | POA: Diagnosis not present

## 2020-09-05 ENCOUNTER — Telehealth: Payer: Self-pay | Admitting: *Deleted

## 2020-09-05 NOTE — Telephone Encounter (Signed)
-----   Message from Anita Greenspan, MD sent at 09/04/2020  6:03 PM EST ----- Bone density is down in the spine and stable in hips.  We are going to proceed with a drug holiday from alendronate  Would consider prolia if her insurance covered it (can she check or we check?)  Bone loss is still mild in the osteopenia range (so that is reassuring) -so no hurry currently This is a twice yearly infusion to prevent fractures Continue supplements and exercise if able

## 2020-09-05 NOTE — Telephone Encounter (Signed)
Left VM requesting pt to call the office back 

## 2020-10-17 DIAGNOSIS — H40001 Preglaucoma, unspecified, right eye: Secondary | ICD-10-CM | POA: Diagnosis not present

## 2020-10-31 NOTE — Telephone Encounter (Signed)
Error

## 2020-11-11 ENCOUNTER — Telehealth: Payer: Self-pay | Admitting: Family Medicine

## 2020-11-11 ENCOUNTER — Other Ambulatory Visit: Payer: Medicare Other

## 2020-11-11 DIAGNOSIS — E7800 Pure hypercholesterolemia, unspecified: Secondary | ICD-10-CM

## 2020-11-11 NOTE — Telephone Encounter (Signed)
-----   Message from Ellamae Sia sent at 10/28/2020 10:07 AM EDT ----- Regarding: Lab orders for Tuesday, 3.29.22 Lab orders for a 3 month follow up appt.

## 2020-11-12 ENCOUNTER — Other Ambulatory Visit: Payer: Self-pay

## 2020-11-12 ENCOUNTER — Other Ambulatory Visit (INDEPENDENT_AMBULATORY_CARE_PROVIDER_SITE_OTHER): Payer: Medicare Other

## 2020-11-12 DIAGNOSIS — E7800 Pure hypercholesterolemia, unspecified: Secondary | ICD-10-CM | POA: Diagnosis not present

## 2020-11-12 LAB — ALT: ALT: 17 U/L (ref 0–35)

## 2020-11-12 LAB — LIPID PANEL
Cholesterol: 237 mg/dL — ABNORMAL HIGH (ref 0–200)
HDL: 57.2 mg/dL (ref >39.00)
LDL Cholesterol: 157 mg/dL — ABNORMAL HIGH (ref 0–99)
NonHDL: 180.26
Total CHOL/HDL Ratio: 4
Triglycerides: 114 mg/dL (ref 0.0–149.0)
VLDL: 22.8 mg/dL (ref 0.0–40.0)

## 2020-11-12 LAB — AST: AST: 18 U/L (ref 0–37)

## 2020-11-14 ENCOUNTER — Telehealth: Payer: Self-pay | Admitting: Family Medicine

## 2020-11-14 DIAGNOSIS — E7800 Pure hypercholesterolemia, unspecified: Secondary | ICD-10-CM

## 2020-11-14 MED ORDER — ROSUVASTATIN CALCIUM 10 MG PO TABS: 10.0000 mg | ORAL_TABLET | Freq: Every day | ORAL | 3 refills | Status: DC

## 2020-11-14 NOTE — Telephone Encounter (Signed)
-----   Message from Tammi Sou, [place] sent at 11/14/2020 12:44 PM EDT ----- Pt notified of lab results and Dr. Marliss Coots comments. Pt also viewed results on mychart. Pt is willing to try statin med   [facility] [address]

## 2020-11-14 NOTE — Telephone Encounter (Signed)
I sent in crestor 10 mg  Alert me if any problems or side effects  Re check lipids in 6 wk please - order done

## 2020-11-26 DIAGNOSIS — Z23 Encounter for immunization: Secondary | ICD-10-CM | POA: Diagnosis not present

## 2020-12-09 ENCOUNTER — Other Ambulatory Visit: Payer: Self-pay | Admitting: Family Medicine

## 2020-12-09 DIAGNOSIS — Z1231 Encounter for screening mammogram for malignant neoplasm of breast: Secondary | ICD-10-CM

## 2021-01-02 ENCOUNTER — Ambulatory Visit
Admission: RE | Admit: 2021-01-02 | Discharge: 2021-01-02 | Disposition: A | Discharge status: Home / Self Care | Payer: Medicare Other | Source: Ambulatory Visit | Attending: Family Medicine | Admitting: Family Medicine

## 2021-01-02 ENCOUNTER — Other Ambulatory Visit: Payer: Self-pay

## 2021-01-02 DIAGNOSIS — Z1231 Encounter for screening mammogram for malignant neoplasm of breast: Secondary | ICD-10-CM | POA: Insufficient documentation

## 2021-01-09 ENCOUNTER — Ambulatory Visit: Payer: Medicare Other

## 2021-01-31 ENCOUNTER — Ambulatory Visit (INDEPENDENT_AMBULATORY_CARE_PROVIDER_SITE_OTHER): Payer: Medicare Other

## 2021-01-31 ENCOUNTER — Other Ambulatory Visit: Payer: Self-pay

## 2021-01-31 DIAGNOSIS — Z0000 Encounter for general adult medical examination without abnormal findings: Secondary | ICD-10-CM

## 2021-01-31 NOTE — Patient Instructions (Signed)
Anita Buckley , Thank you for taking time to come for your Medicare Wellness Visit. I appreciate your ongoing commitment to your health goals. Please review the following plan we discussed and let me know if I can assist you in the future.   Screening recommendations/referrals: Colonoscopy: Up to date, completed 05/11/2016, due 04/2026 Mammogram: Up to date, completed 01/02/2021, due 12/2021 Bone Density: Up to date, completed 09/04/2020, due 08/2022 Recommended yearly ophthalmology/optometry visit for glaucoma screening and checkup Recommended yearly dental visit for hygiene and checkup  Vaccinations: Influenza vaccine: Up to date, completed 05/20/2020, due 03/2021 Pneumococcal vaccine: Completed series Tdap vaccine: Up to date, completed 01/09/2014, due 12/2023 Shingles vaccine: Completed series   Covid-19:Completed series  Advanced directives: Advance directive discussed with you today. Even though you declined this today please call our office should you change your mind and we can give you the proper paperwork for you to fill out.   Conditions/risks identified: hypertension, hypercholesterolemia   Next appointment: Follow up in one year for your annual wellness visit    Preventive Care 68 Years and Older, Female Preventive care refers to lifestyle choices and visits with your health care provider that can promote health and wellness. What does preventive care include? A yearly physical exam. This is also called an annual well check. Dental exams once or twice a year. Routine eye exams. Ask your health care provider how often you should have your eyes checked. Personal lifestyle choices, including: Daily care of your teeth and gums. Regular physical activity. Eating a healthy diet. Avoiding tobacco and drug use. Limiting alcohol use. Practicing safe sex. Taking low-dose aspirin every day. Taking vitamin and mineral supplements as recommended by your health care provider. What happens  during an annual well check? The services and screenings done by your health care provider during your annual well check will depend on your age, overall health, lifestyle risk factors, and family history of disease. Counseling  Your health care provider may ask you questions about your: Alcohol use. Tobacco use. Drug use. Emotional well-being. Home and relationship well-being. Sexual activity. Eating habits. History of falls. Memory and ability to understand (cognition). Work and work Statistician. Reproductive health. Screening  You may have the following tests or measurements: Height, weight, and BMI. Blood pressure. Lipid and cholesterol levels. These may be checked every 5 years, or more frequently if you are over 68 years old. Skin check. Lung cancer screening. You may have this screening every year starting at age 68 if you have a 30-pack-year history of smoking and currently smoke or have quit within the past 15 years. Fecal occult blood test (FOBT) of the stool. You may have this test every year starting at age 68. Flexible sigmoidoscopy or colonoscopy. You may have a sigmoidoscopy every 5 years or a colonoscopy every 10 years starting at age 68. Hepatitis C blood test. Hepatitis B blood test. Sexually transmitted disease (STD) testing. Diabetes screening. This is done by checking your blood sugar (glucose) after you have not eaten for a while (fasting). You may have this done every 1-3 years. Bone density scan. This is done to screen for osteoporosis. You may have this done starting at age 68. Mammogram. This may be done every 1-2 years. Talk to your health care provider about how often you should have regular mammograms. Talk with your health care provider about your test results, treatment options, and if necessary, the need for more tests. Vaccines  Your health care provider may recommend certain vaccines, such  as: Influenza vaccine. This is recommended every  year. Tetanus, diphtheria, and acellular pertussis (Tdap, Td) vaccine. You may need a Td booster every 10 years. Zoster vaccine. You may need this after age 68. Pneumococcal 13-valent conjugate (PCV13) vaccine. One dose is recommended after age 68. Pneumococcal polysaccharide (PPSV23) vaccine. One dose is recommended after age 68 Talk to your health care provider about which screenings and vaccines you need and how often you need them. This information is not intended to replace advice given to you by your health care provider. Make sure you discuss any questions you have with your health care provider. Document Released: 08/30/2015 Document Revised: 04/22/2016 Document Reviewed: 06/04/2015 Elsevier Interactive Patient Education  2017 [place] Prevention in the Home Falls can cause injuries. They can happen to people of all ages. There are many things you can do to make your home safe and to help prevent falls. What can I do on the outside of my home? Regularly fix the edges of walkways and driveways and fix any cracks. Remove anything that might make you trip as you walk through a door, such as a raised step or threshold. Trim any bushes or trees on the path to your home. Use bright outdoor lighting. Clear any walking paths of anything that might make someone trip, such as rocks or tools. Regularly check to see if handrails are loose or broken. Make sure that both sides of any steps have handrails. Any raised decks and porches should have guardrails on the edges. Have any leaves, snow, or ice cleared regularly. Use sand or salt on walking paths during winter. Clean up any spills in your garage right away. This includes oil or grease spills. What can I do in the bathroom? Use night lights. Install grab bars by the toilet and in the tub and shower. Do not use towel bars as grab bars. Use non-skid mats or decals in the tub or shower. If you need to sit down in the shower, use a  plastic, non-slip stool. Keep the floor dry. Clean up any water that spills on the floor as soon as it happens. Remove soap buildup in the tub or shower regularly. Attach bath mats securely with double-sided non-slip rug tape. Do not have throw rugs and other things on the floor that can make you trip. What can I do in the bedroom? Use night lights. Make sure that you have a light by your bed that is easy to reach. Do not use any sheets or blankets that are too big for your bed. They should not hang down onto the floor. Have a firm chair that has side arms. You can use this for support while you get dressed. Do not have throw rugs and other things on the floor that can make you trip. What can I do in the kitchen? Clean up any spills right away. Avoid walking on wet floors. Keep items that you use a lot in easy-to-reach places. If you need to reach something above you, use a strong step stool that has a grab bar. Keep electrical cords out of the way. Do not use floor polish or wax that makes floors slippery. If you must use wax, use non-skid floor wax. Do not have throw rugs and other things on the floor that can make you trip. What can I do with my stairs? Do not leave any items on the stairs. Make sure that there are handrails on both sides of the stairs and use  them. Fix handrails that are broken or loose. Make sure that handrails are as long as the stairways. Check any carpeting to make sure that it is firmly attached to the stairs. Fix any carpet that is loose or worn. Avoid having throw rugs at the top or bottom of the stairs. If you do have throw rugs, attach them to the floor with carpet tape. Make sure that you have a light switch at the top of the stairs and the bottom of the stairs. If you do not have them, ask someone to add them for you. What else can I do to help prevent falls? Wear shoes that: Do not have high heels. Have rubber bottoms. Are comfortable and fit you  well. Are closed at the toe. Do not wear sandals. If you use a stepladder: Make sure that it is fully opened. Do not climb a closed stepladder. Make sure that both sides of the stepladder are locked into place. Ask someone to hold it for you, if possible. Clearly mark and make sure that you can see: Any grab bars or handrails. First and last steps. Where the edge of each step is. Use tools that help you move around (mobility aids) if they are needed. These include: Canes. Walkers. Scooters. Crutches. Turn on the lights when you go into a dark area. Replace any light bulbs as soon as they burn out. Set up your furniture so you have a clear path. Avoid moving your furniture around. If any of your floors are uneven, fix them. If there are any pets around you, be aware of where they are. Review your medicines with your doctor. Some medicines can make you feel dizzy. This can increase your chance of falling. Ask your doctor what other things that you can do to help prevent falls. This information is not intended to replace advice given to you by your health care provider. Make sure you discuss any questions you have with your health care provider. Document Released: 05/30/2009 Document Revised: 01/09/2016 Document Reviewed: 09/07/2014 Elsevier Interactive Patient Education  2017 [employer].

## 2021-01-31 NOTE — Progress Notes (Signed)
Subjective:   Anita Buckley is a 70 y.o. female who presents for Medicare Annual (Subsequent) preventive examination.  Review of Systems: N/A      I connected with the patient today by telephone and verified that I am speaking with the correct person using two identifiers. Location patient: home Location nurse: work Persons participating in the telephone visit: patient, nurse.   I discussed the limitations, risks, security and privacy concerns of performing an evaluation and management service by telephone and the availability of in person appointments. I also discussed with the patient that there may be a patient responsible charge related to this service. The patient expressed understanding and verbally consented to this telephonic visit.        Cardiac Risk Factors include: advanced age (>77men, >40 women);hypertension;Other (see comment), Risk factor comments: hypercholesterolemia     Objective:    Today's Vitals   There is no height or weight on file to calculate BMI.  Advanced Directives 01/31/2021 04/08/2020 01/09/2020 11/07/2019 10/05/2019 09/25/2019 09/05/2018  Does Patient Have a Medical Advance Directive? Yes No Yes Yes Yes Yes Yes  Type of Paramedic of [place];Living will - [place];Living will [place];Living will [place];Living will [place];Living will [place];Living will  Does patient want to make changes to medical advance directive? - - - - No - Patient declined No - Patient declined No - Patient declined  Copy of [place] in Chart? No - copy requested - No - copy requested No - copy requested No - copy requested No - copy requested No - copy requested  Would patient like information on creating a medical advance directive? - - - - - - -    Current Medications (verified) Outpatient Encounter Medications as of 01/31/2021   Medication Sig   ALPRAZolam (XANAX) 0.5 MG tablet TAKE 1 TABLET BY MOUTH EVERY DAY AS NEEDED FOR ANXIETY   amLODipine (NORVASC) 5 MG tablet Take 1 tablet (5 mg total) by mouth daily.   Calcium Citrate-Vitamin D (CITRACAL + D PO) Take 1-2 capsules by mouth daily.    EPINEPHrine 0.3 mg/0.3 mL IJ SOAJ injection INJECT 0.3 ML INTO MUSCLE AS NEEDED   famotidine (PEPCID) 10 MG tablet Take 1 tablet (10 mg total) by mouth daily.   ibuprofen (ADVIL) 200 MG tablet Take 200 mg by mouth every 4 (four) hours as needed.   latanoprost (XALATAN) 0.005 % ophthalmic solution Place 1 drop into both eyes at bedtime.   rosuvastatin (CRESTOR) 10 MG tablet Take 1 tablet (10 mg total) by mouth daily.   timolol (BETIMOL) 0.25 % ophthalmic solution Place 1 drop into both eyes daily.   Glucosamine-Chondroitin (MOVE FREE PO) Take 2 tablets by mouth daily.    No facility-administered encounter medications on file as of 01/31/2021.    Allergies (verified) Bee venom, Buspirone hcl, Minocycline, and Penicillins   History: Past Medical History:  Diagnosis Date   Anxiety    Cancer ([place])    B CELL LYMPHOMA   Chronic lymphocytic leukemia (CLL), B-cell ([place]) 02/11/2016   Depression    History of anxiety    History of depression    History of fibrocystic disease of breast    History of hyperlipidemia    Hypertension    Situational stress    Past Surgical History:  Procedure Laterality Date   COLONOSCOPY  12/2005   polyps, tics   COLONOSCOPY WITH PROPOFOL  N/A 05/11/2016   Procedure: COLONOSCOPY WITH PROPOFOL;  Surgeon: Manya Silvas, MD;  Location: [facility];  Service: Endoscopy;  Laterality: N/A;   THYROIDECTOMY Left 08/20/2015   Procedure: THYROIDECTOMY/ HEMITHYROIDECTOMY;  Surgeon: Beverly Gust, MD;  Location: [facility] ORS;  Service: ENT;  Laterality: Left;   TUBAL LIGATION     Family History  Problem Relation Age of Onset   Diabetes Father        late in life   Alcohol abuse Father    Hyperlipidemia  Mother    Mental illness Mother    Depression Sister    Leukemia Brother    Bladder Cancer Brother    Schizophrenia Other        Grandfather   Breast cancer Neg Hx    Social History   Socioeconomic History   Marital status: Married    Spouse name: Not on file   Number of children: Not on file   Years of education: Not on file   Highest education level: Not on file  Occupational History   Not on file  Tobacco Use   Smoking status: Never   Smokeless tobacco: Never  Vaping Use   Vaping Use: Never used  Substance and Sexual Activity   Alcohol use: Yes    Alcohol/week: 5.0 - 10.0 standard drinks    Types: 5 - 10 Glasses of wine per week    Comment: occ (wine with dinner)   Drug use: No   Sexual activity: Not on file  Other Topics Concern   Not on file  Social History Narrative   Lives at home in private residence with husband   Social Determinants of Health   Financial Resource Strain: Low Risk    Difficulty of Paying Living Expenses: Not hard at all  Food Insecurity: No Food Insecurity   Worried About Charity fundraiser in the Last Year: Never true   Arboriculturist in the Last Year: Never true  Transportation Needs: No Transportation Needs   Lack of Transportation (Medical): No   Lack of Transportation (Non-Medical): No  Physical Activity: Sufficiently Active   Days of Exercise per Week: 5 days   Minutes of Exercise per Session: 40 min  Stress: No Stress Concern Present   Feeling of Stress : Not at all  Social Connections: Not on file    Tobacco Counseling Counseling given: Not Answered   Clinical Intake:  Pre-visit preparation completed: Yes  Pain : No/denies pain     Nutritional Risks: None Diabetes: No  How often do you need to have someone help you when you read instructions, pamphlets, or other written materials from your doctor or pharmacy?: 1 - Never What is the last grade level you completed in school?: 12th  Diabetic: N/A Nutrition Risk  Assessment:  Has the patient had any N/V/D within the last 2 months?  No  Does the patient have any non-healing wounds?  No  Has the patient had any unintentional weight loss or weight gain?  No   Diabetes:  Is the patient diabetic?  No  If diabetic, was a CBG obtained today?   N/A Did the patient bring in their glucometer from home?   N/A How often do you monitor your CBG's? N/A.   Financial Strains and Diabetes Management:  Are you having any financial strains with the device, your supplies or your medication?  N/A .  Does the patient want to be seen by Chronic Care Management for management of their  diabetes?   N/A Would the patient like to be referred to a Nutritionist or for Diabetic Management?   N/A   Interpreter Needed?: No  Information entered by :: CJohnson,LPN   Activities of Daily Living In your present state of health, do you have any difficulty performing the following activities: 01/31/2021  Hearing? N  Vision? N  Difficulty concentrating or making decisions? N  Walking or climbing stairs? N  Dressing or bathing? N  Doing errands, shopping? N  Preparing Food and eating ? N  Using the Toilet? N  In the past six months, have you accidently leaked urine? N  Do you have problems with loss of bowel control? N  Managing your Medications? N  Managing your Finances? N  Housekeeping or managing your Housekeeping? N  Some recent data might be hidden    Patient Care Team: [place], Wynelle Fanny, MD as PCP - General  Indicate any recent Medical Services you may have received from other than [facility] providers in the past year (date may be approximate).     Assessment:   This is a routine wellness examination for Annalise.  Hearing/Vision screen Vision Screening - Comments:: Patient gets annual eye exams  Dietary issues and exercise activities discussed: Current Exercise Habits: Home exercise routine, Time (Minutes): 45, Frequency (Times/Week): 5, Weekly Exercise  (Minutes/Week): 225, Intensity: Moderate, Exercise limited by: None identified   Goals Addressed             This Visit's Progress    Patient Stated       01/31/2021, I will continue to do yoga 4-5 days a week for about 45 minutes.        Depression Screen PHQ 2/9 Scores 01/31/2021 01/09/2020 03/01/2018 03/01/2017 01/09/2014 01/06/2013  PHQ - 2 Score 0 0 0 2 0 0  PHQ- 9 Score 0 0 - 8 - -    Fall Risk Fall Risk  01/31/2021 01/09/2020 03/01/2018  Falls in the past year? 0 0 No  Number falls in past yr: 0 0 -  Injury with Fall? 0 0 -  Risk for fall due to : Medication side effect Medication side effect -  Follow up Falls evaluation completed;Falls prevention discussed Falls evaluation completed;Falls prevention discussed -    FALL RISK PREVENTION PERTAINING TO THE HOME:  Any stairs in or around the home? Yes  If so, are there any without handrails? No  Home free of loose throw rugs in walkways, pet beds, electrical cords, etc? Yes  Adequate lighting in your home to reduce risk of falls? Yes   ASSISTIVE DEVICES UTILIZED TO PREVENT FALLS:  Life alert? No  Use of a cane, walker or w/c? No  Grab bars in the bathroom? No  Shower chair or bench in shower? No  Elevated toilet seat or a handicapped toilet? No   TIMED UP AND GO:  Was the test performed?  N/A telephone visit .    Cognitive Function: MMSE - Mini Mental State Exam 01/31/2021 01/09/2020  Not completed: Refused -  Orientation to time - 5  Orientation to Place - 5  Registration - 3  Attention/ Calculation - 5  Recall - 3  Language- repeat - 1  Mini Cog  Mini-Cog screen was not completed. Patient refused. Maximum score is 22. A value of 0 denotes this part of the MMSE was not completed or the patient failed this part of the Mini-Cog screening.       Immunizations Immunization History  Administered Date(s) Administered  Influenza Whole 05/08/2008, 06/17/2009   Influenza-Unspecified 06/01/2015, 06/03/2017,  07/04/2018, 06/21/2019, 05/20/2020   PFIZER Comirnaty(Gray Top)Covid-19 Tri-Sucrose Vaccine 11/26/2020   PFIZER(Purple Top)SARS-COV-2 Vaccination 08/22/2019, 09/12/2019, 03/31/2020   Pneumococcal Conjugate-13 03/01/2018   Pneumococcal Polysaccharide-23 04/28/2019   Td 08/26/2000, 08/18/2003   Tdap 01/09/2014   Zoster Recombinat (Shingrix) 06/21/2019, 10/23/2019   Zoster, Live 06/01/2007    TDAP status: Up to date  Flu Vaccine status: Up to date  Pneumococcal vaccine status: Up to date  Covid-19 vaccine status: Completed vaccines  Qualifies for Shingles Vaccine? Yes   Zostavax completed Yes   Shingrix Completed?: Yes  Screening Tests Health Maintenance  Topic Date Due   Hepatitis C Screening  Never done   INFLUENZA VACCINE  03/17/2021   COVID-19 Vaccine (5 - Booster for [place] series) 03/28/2021   MAMMOGRAM  01/03/2023   TETANUS/TDAP  01/10/2024   COLONOSCOPY (Pts 45-24yrs Insurance coverage will need to be confirmed)  05/11/2026   DEXA SCAN  Completed   PNA vac Low Risk Adult  Completed   Zoster Vaccines- Shingrix  Completed   HPV VACCINES  Aged Out    Health Maintenance  Health Maintenance Due  Topic Date Due   Hepatitis C Screening  Never done    Colorectal cancer screening: Type of screening: Colonoscopy. Completed 05/11/2016. Repeat every 10 years  Mammogram status: Completed 01/02/2021. Repeat every year  Bone Density status: Completed 09/04/2020. Results reflect: Bone density results: OSTEOPENIA. Repeat every 2 years.  Lung Cancer Screening: (Low Dose CT Chest recommended if Age 40-80 years, 30 pack-year currently smoking OR have quit w/in 15 years.) does not qualify.   Additional Screening:  Hepatitis C Screening: does qualify; Completed due  Vision Screening: Recommended annual ophthalmology exams for early detection of glaucoma and other disorders of the eye. Is the patient up to date with their annual eye exam?  Yes  Who is the provider or what is the  name of the office in which the patient attends annual eye exams? [facility], Dr. Wallace Going If pt is not established with a provider, would they like to be referred to a provider to establish care? No .   Dental Screening: Recommended annual dental exams for proper oral hygiene  Community Resource Referral / Chronic Care Management: CRR required this visit?  No   CCM required this visit?  No      Plan:     I have personally reviewed and noted the following in the patient's chart:   Medical and social history Use of alcohol, tobacco or illicit drugs  Current medications and supplements including opioid prescriptions.  Functional ability and status Nutritional status Physical activity Advanced directives List of other physicians Hospitalizations, surgeries, and ER visits in previous 12 months Vitals Screenings to include cognitive, depression, and falls Referrals and appointments  In addition, I have reviewed and discussed with patient certain preventive protocols, quality metrics, and best practice recommendations. A written personalized care plan for preventive services as well as general preventive health recommendations were provided to patient.   Due to this being a telephonic visit, the after visit summary with patients personalized plan was offered to patient via office or my-chart. Patient preferred to pick up at office at next visit or via mychart.   Andrez Grime, LPN   [id number]/[id number]

## 2021-01-31 NOTE — Progress Notes (Signed)
PCP notes:  Health Maintenance: No gaps noted   Abnormal Screenings: none   Patient concerns: none   Nurse concerns: none   Next PCP appt.: 07/30/2021 @ 11 am

## 2021-02-01 ENCOUNTER — Other Ambulatory Visit: Payer: Self-pay | Admitting: Family Medicine

## 2021-02-04 NOTE — Telephone Encounter (Signed)
Last OV - 07/29/2020 Next OV  -07/30/2021  Last refilled - 04/05/20

## 2021-04-08 ENCOUNTER — Inpatient Hospital Stay: Payer: Medicare Other | Attending: Oncology

## 2021-04-08 ENCOUNTER — Inpatient Hospital Stay (HOSPITAL_BASED_OUTPATIENT_CLINIC_OR_DEPARTMENT_OTHER): Payer: Medicare Other | Admitting: Oncology

## 2021-04-08 ENCOUNTER — Encounter: Payer: Self-pay | Admitting: Oncology

## 2021-04-08 VITALS — BP 145/91 | HR 62 | Temp 98.4°F | Resp 18 | Wt 152.0 lb

## 2021-04-08 DIAGNOSIS — C8300 Small cell B-cell lymphoma, unspecified site: Secondary | ICD-10-CM | POA: Diagnosis not present

## 2021-04-08 DIAGNOSIS — C9110 Chronic lymphocytic leukemia of B-cell type not having achieved remission: Secondary | ICD-10-CM | POA: Diagnosis not present

## 2021-04-08 DIAGNOSIS — E785 Hyperlipidemia, unspecified: Secondary | ICD-10-CM | POA: Insufficient documentation

## 2021-04-08 DIAGNOSIS — R590 Localized enlarged lymph nodes: Secondary | ICD-10-CM | POA: Insufficient documentation

## 2021-04-08 DIAGNOSIS — Z8052 Family history of malignant neoplasm of bladder: Secondary | ICD-10-CM | POA: Diagnosis not present

## 2021-04-08 DIAGNOSIS — Z79899 Other long term (current) drug therapy: Secondary | ICD-10-CM | POA: Insufficient documentation

## 2021-04-08 DIAGNOSIS — R5383 Other fatigue: Secondary | ICD-10-CM | POA: Diagnosis not present

## 2021-04-08 DIAGNOSIS — D509 Iron deficiency anemia, unspecified: Secondary | ICD-10-CM | POA: Diagnosis not present

## 2021-04-08 DIAGNOSIS — I10 Essential (primary) hypertension: Secondary | ICD-10-CM | POA: Insufficient documentation

## 2021-04-08 LAB — CBC WITH DIFFERENTIAL/PLATELET
Abs Immature Granulocytes: 0.03 10*3/uL (ref 0.00–0.07)
Basophils Absolute: 0.1 10*3/uL (ref 0.0–0.1)
Basophils Relative: 1 %
Eosinophils Absolute: 0.2 10*3/uL (ref 0.0–0.5)
Eosinophils Relative: 2 %
HCT: 41.5 % (ref 36.0–46.0)
Hemoglobin: 13.4 g/dL (ref 12.0–15.0)
Immature Granulocytes: 0 %
Lymphocytes Relative: 59 %
Lymphs Abs: 7.3 10*3/uL — ABNORMAL HIGH (ref 0.7–4.0)
MCH: 28.9 pg (ref 26.0–34.0)
MCHC: 32.3 g/dL (ref 30.0–36.0)
MCV: 89.6 fL (ref 80.0–100.0)
Monocytes Absolute: 1 10*3/uL (ref 0.1–1.0)
Monocytes Relative: 8 %
Neutro Abs: 3.7 10*3/uL (ref 1.7–7.7)
Neutrophils Relative %: 30 %
Platelets: 182 10*3/uL (ref 150–400)
RBC: 4.63 MIL/uL (ref 3.87–5.11)
RDW: 12.2 % (ref 11.5–15.5)
WBC: 12.3 10*3/uL — ABNORMAL HIGH (ref 4.0–10.5)
nRBC: 0 % (ref 0.0–0.2)

## 2021-04-08 LAB — COMPREHENSIVE METABOLIC PANEL
ALT: 23 U/L (ref 0–44)
AST: 22 U/L (ref 15–41)
Albumin: 4.6 g/dL (ref 3.5–5.0)
Alkaline Phosphatase: 52 U/L (ref 38–126)
Anion gap: 7 (ref 5–15)
BUN: 22 mg/dL (ref 8–23)
CO2: 29 mmol/L (ref 22–32)
Calcium: 9.1 mg/dL (ref 8.9–10.3)
Chloride: 101 mmol/L (ref 98–111)
Creatinine, Ser: 0.7 mg/dL (ref 0.44–1.00)
GFR, Estimated: >60 mL/min (ref >60)
Glucose, Bld: 106 mg/dL — ABNORMAL HIGH (ref 70–99)
Potassium: 3.9 mmol/L (ref 3.5–5.1)
Sodium: 137 mmol/L (ref 135–145)
Total Bilirubin: 1 mg/dL (ref 0.3–1.2)
Total Protein: 7.5 g/dL (ref 6.5–8.1)

## 2021-04-08 LAB — LACTATE DEHYDROGENASE: LDH: 123 U/L (ref 98–192)

## 2021-04-08 NOTE — Progress Notes (Signed)
Hematology/Oncology Consult note [facility]  [phone] [phone]  Patient Care Team: Abner Greenspan, MD as PCP - General   Name of the patient: Anita Buckley  GN:[id number]  August 29, 1950   Date of visit: 04/08/21  Diagnosis- iron deficiency anemia  Chief complaint/ Reason for visit- routine f/u of iron deficiency anemia  Heme/Onc history: Patient is a 70 year old female who was diagnosed with SLL back in 2015.  She was having some problems with her thyroid gland back then an ultrasound of the soft tissue head and neck in September 2015 revealed homogeneous thyroid gland with several thyroid nodules.  Several enlarged left-sided neck lymph nodes.  FNA of the left neck no was consistent with clonal population of lymphocytes CD5 positive, CD23 positive and CD43 8+.  It was negative for malignant cells.  She also had a PET scan subsequently and her last PET scan was in 2016 which showed stable mild metabolism within a 1.2 cm level 3 lymph node but no evidence of hypermetabolism or significant adenopathy elsewhere.  CT chest in February 2018 revealed mild thoracic lymphadenopathy compatible with CLL.  She has not had any peripheral blood lymphocytosis  Interval history-Anita Buckley reports caregiver fatigue as she is caring for her husband who has advanced stages of Parkinson's disease.  Reports having very little time for herself and very little help at home.  She will be going on a weekend get away to [facility] with her sister and friends this weekend which will be the first in a long time.  Denies any B symptoms.  Denies any new lumps or bumps.  Feels overall stable.  ECOG PS- 0 Pain scale- 0   Review of systems- Review of Systems  Constitutional: Negative.  Negative for chills, fever, malaise/fatigue and weight loss.  HENT:  Negative for congestion, ear pain and tinnitus.   Eyes: Negative.  Negative for blurred vision and double  vision.  Respiratory: Negative.  Negative for cough, sputum production and shortness of breath.   Cardiovascular: Negative.  Negative for chest pain, palpitations and leg swelling.  Gastrointestinal: Negative.  Negative for abdominal pain, constipation, diarrhea, nausea and vomiting.  Genitourinary:  Negative for dysuria, frequency and urgency.  Musculoskeletal:  Negative for back pain and falls.  Skin: Negative.  Negative for rash.  Neurological: Negative.  Negative for weakness and headaches.  Endo/Heme/Allergies: Negative.  Does not bruise/bleed easily.  Psychiatric/Behavioral: Negative.  Negative for depression. The patient is not nervous/anxious and does not have insomnia.      Allergies  Allergen Reactions   Bee Venom     Anaphylaxis    Buspirone Hcl     REACTION: did not like   Minocycline Nausea And Vomiting   Penicillins     REACTION: reaction as a child     Past Medical History:  Diagnosis Date   Anxiety    Cancer ([place])    B CELL LYMPHOMA   Chronic lymphocytic leukemia (CLL), B-cell ([place]) 02/11/2016   Depression    History of anxiety    History of depression    History of fibrocystic disease of breast    History of hyperlipidemia    Hypertension    Situational stress      Past Surgical History:  Procedure Laterality Date   COLONOSCOPY  12/2005   polyps, tics   COLONOSCOPY WITH PROPOFOL N/A 05/11/2016   Procedure: COLONOSCOPY WITH PROPOFOL;  Surgeon: Manya Silvas, MD;  Location: [facility];  Service:  Endoscopy;  Laterality: N/A;   THYROIDECTOMY Left 08/20/2015   Procedure: THYROIDECTOMY/ HEMITHYROIDECTOMY;  Surgeon: Beverly Gust, MD;  Location: [facility] ORS;  Service: ENT;  Laterality: Left;   TUBAL LIGATION      Social History   Socioeconomic History   Marital status: Married    Spouse name: Not on file   Number of children: Not on file   Years of education: Not on file   Highest education level: Not on file  Occupational History   Not on file   Tobacco Use   Smoking status: Never   Smokeless tobacco: Never  Vaping Use   Vaping Use: Never used  Substance and Sexual Activity   Alcohol use: Yes    Alcohol/week: 5.0 - 10.0 standard drinks    Types: 5 - 10 Glasses of wine per week    Comment: occ (wine with dinner)   Drug use: No   Sexual activity: Not on file  Other Topics Concern   Not on file  Social History Narrative   Lives at home in private residence with husband   Social Determinants of Health   Financial Resource Strain: Low Risk    Difficulty of Paying Living Expenses: Not hard at all  Food Insecurity: No Food Insecurity   Worried About Charity fundraiser in the Last Year: Never true   Arboriculturist in the Last Year: Never true  Transportation Needs: No Transportation Needs   Lack of Transportation (Medical): No   Lack of Transportation (Non-Medical): No  Physical Activity: Sufficiently Active   Days of Exercise per Week: 5 days   Minutes of Exercise per Session: 40 min  Stress: No Stress Concern Present   Feeling of Stress : Not at all  Social Connections: Not on file  Intimate Partner Violence: Not At Risk   Fear of Current or Ex-Partner: No   Emotionally Abused: No   Physically Abused: No   Sexually Abused: No    Family History  Problem Relation Age of Onset   Diabetes Father        late in life   Alcohol abuse Father    Hyperlipidemia Mother    Mental illness Mother    Depression Sister    Leukemia Brother    Bladder Cancer Brother    Schizophrenia Other        Grandfather   Breast cancer Neg Hx      Current Outpatient Medications:    ALPRAZolam (XANAX) 0.5 MG tablet, TAKE 1 TABLET BY MOUTH EVERY DAY AS NEEDED FOR ANXIETY, Disp: 30 tablet, Rfl: 0   amLODipine (NORVASC) 5 MG tablet, Take 1 tablet (5 mg total) by mouth daily., Disp: 90 tablet, Rfl: 3   Calcium Citrate-Vitamin D (CITRACAL + D PO), Take 1-2 capsules by mouth daily. , Disp: , Rfl:    EPINEPHrine 0.3 mg/0.3 mL IJ SOAJ  injection, INJECT 0.3 ML INTO MUSCLE AS NEEDED, Disp: 2 each, Rfl: 3   famotidine (PEPCID) 10 MG tablet, Take 1 tablet (10 mg total) by mouth daily., Disp: , Rfl:    ibuprofen (ADVIL) 200 MG tablet, Take 200 mg by mouth every 4 (four) hours as needed., Disp: , Rfl:    latanoprost (XALATAN) 0.005 % ophthalmic solution, Place 1 drop into both eyes at bedtime., Disp: , Rfl:    rosuvastatin (CRESTOR) 10 MG tablet, Take 1 tablet (10 mg total) by mouth daily., Disp: 90 tablet, Rfl: 3   timolol (TIMOPTIC) 0.5 % ophthalmic solution, 1  drop daily., Disp: , Rfl:   Physical exam:  Vitals:   04/08/21 1009  BP: (!) 145/91  Pulse: 62  Resp: 18  Temp: 98.4 F (36.9 C)  TempSrc: Tympanic  SpO2: 97%  Weight: 152 lb (68.9 kg)   Physical Exam Constitutional:      Appearance: Normal appearance.  HENT:     Head: Normocephalic and atraumatic.  Eyes:     Pupils: Pupils are equal, round, and reactive to light.  Cardiovascular:     Rate and Rhythm: Normal rate and regular rhythm.     Heart sounds: Normal heart sounds. No murmur heard. Pulmonary:     Effort: Pulmonary effort is normal.     Breath sounds: Normal breath sounds. No wheezing.  Abdominal:     General: Bowel sounds are normal. There is no distension.     Palpations: Abdomen is soft.     Tenderness: There is no abdominal tenderness.  Musculoskeletal:        General: Normal range of motion.     Cervical back: Normal range of motion.  Skin:    General: Skin is warm and dry.     Findings: No rash.  Neurological:     Mental Status: She is alert and oriented to person, place, and time.  Psychiatric:        Judgment: Judgment normal.     CMP Latest Ref Rng & Units 04/08/2021  Glucose 70 - 99 mg/dL 106(H)  BUN 8 - 23 mg/dL 22  Creatinine 0.44 - 1.00 mg/dL 0.70  Sodium 135 - 145 mmol/L 137  Potassium 3.5 - 5.1 mmol/L 3.9  Chloride 98 - 111 mmol/L 101  CO2 22 - 32 mmol/L 29  Calcium 8.9 - 10.3 mg/dL 9.1  Total Protein 6.5 - 8.1 g/dL  7.5  Total Bilirubin 0.3 - 1.2 mg/dL 1.0  Alkaline Phos 38 - 126 U/L 52  AST 15 - 41 U/L 22  ALT 0 - 44 U/L 23   CBC Latest Ref Rng & Units 04/08/2021  WBC 4.0 - 10.5 K/uL 12.3(H)  Hemoglobin 12.0 - 15.0 g/dL 13.4  Hematocrit 36.0 - 46.0 % 41.5  Platelets 150 - 400 K/uL 182    Assessment and plan- Patient is a 70 y.o. female with history of SLL here for routine follow-up.   CLL/SLL- Overall patient is doing well except for significant fatigue secondary to taking care of her husband who is in advanced stages of Parkinson's disease.  Lab work from 04/08/2021 shows mild lymphocytosis 12.3 and differential shows elevation in ALC. Denies any B symptoms. LDH is normal. She is asymptomatic therefore does not require treatment at this time.  Plan is to continue to monitor conservatively with lab work (CBC, CMP and LDH) and see Dr. Janese Banks back in 1 year.  Patient to call clinic for any concerns.  Thyroid abnormality- Discovered incidentally during work-up for her CLL.  She is status post partial thyroidectomy and FNP which was negative for malignancy.  No new concerns.  Fatigue- Secondary to caring for her husband.  Denies any weight loss or changes in her appetite.  I spent 25 minutes dedicated to the care of this patient (face-to-face and non-face-to-face) on the date of the encounter to include what is described in the assessment and plan.   Visit Diagnosis 1. Lymphoma, small lymphocytic ([place])   2. Caregiver with fatigue    Faythe Casa, NP 04/08/2021 11:24 AM

## 2021-04-08 NOTE — Progress Notes (Signed)
Pt very fatigued and stressed.  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

## 2021-05-09 DIAGNOSIS — Z23 Encounter for immunization: Secondary | ICD-10-CM | POA: Diagnosis not present

## 2021-05-21 DIAGNOSIS — H40001 Preglaucoma, unspecified, right eye: Secondary | ICD-10-CM | POA: Diagnosis not present

## 2021-05-22 DIAGNOSIS — H40001 Preglaucoma, unspecified, right eye: Secondary | ICD-10-CM | POA: Diagnosis not present

## 2021-05-29 DIAGNOSIS — Z23 Encounter for immunization: Secondary | ICD-10-CM | POA: Diagnosis not present

## 2021-06-03 DIAGNOSIS — J019 Acute sinusitis, unspecified: Secondary | ICD-10-CM | POA: Diagnosis not present

## 2021-06-03 DIAGNOSIS — H6123 Impacted cerumen, bilateral: Secondary | ICD-10-CM | POA: Diagnosis not present

## 2021-07-24 ENCOUNTER — Other Ambulatory Visit: Payer: Self-pay | Admitting: Family Medicine

## 2021-07-30 ENCOUNTER — Other Ambulatory Visit: Payer: Self-pay

## 2021-07-30 ENCOUNTER — Ambulatory Visit (INDEPENDENT_AMBULATORY_CARE_PROVIDER_SITE_OTHER): Payer: Medicare Other | Admitting: Family Medicine

## 2021-07-30 ENCOUNTER — Encounter: Payer: Self-pay | Admitting: Family Medicine

## 2021-07-30 VITALS — BP 130/83 | HR 73 | Temp 97.7°F | Ht 65.5 in | Wt 155.4 lb

## 2021-07-30 DIAGNOSIS — C9110 Chronic lymphocytic leukemia of B-cell type not having achieved remission: Secondary | ICD-10-CM | POA: Diagnosis not present

## 2021-07-30 DIAGNOSIS — F411 Generalized anxiety disorder: Secondary | ICD-10-CM | POA: Diagnosis not present

## 2021-07-30 DIAGNOSIS — I10 Essential (primary) hypertension: Secondary | ICD-10-CM | POA: Diagnosis not present

## 2021-07-30 DIAGNOSIS — R7303 Prediabetes: Secondary | ICD-10-CM

## 2021-07-30 DIAGNOSIS — F5104 Psychophysiologic insomnia: Secondary | ICD-10-CM

## 2021-07-30 DIAGNOSIS — E7800 Pure hypercholesterolemia, unspecified: Secondary | ICD-10-CM

## 2021-07-30 DIAGNOSIS — G4700 Insomnia, unspecified: Secondary | ICD-10-CM | POA: Insufficient documentation

## 2021-07-30 LAB — LIPID PANEL
Cholesterol: 164 mg/dL (ref 0–200)
HDL: 65.7 mg/dL (ref >39.00)
LDL Cholesterol: 78 mg/dL (ref 0–99)
NonHDL: 97.8
Total CHOL/HDL Ratio: 2
Triglycerides: 98 mg/dL (ref 0.0–149.0)
VLDL: 19.6 mg/dL (ref 0.0–40.0)

## 2021-07-30 LAB — CBC WITH DIFFERENTIAL/PLATELET
Basophils Absolute: 0 10*3/uL (ref 0.0–0.1)
Basophils Relative: 0.4 % (ref 0.0–3.0)
Eosinophils Absolute: 0.3 10*3/uL (ref 0.0–0.7)
Eosinophils Relative: 2.2 % (ref 0.0–5.0)
HCT: 39.9 % (ref 36.0–46.0)
Hemoglobin: 13.1 g/dL (ref 12.0–15.0)
Lymphocytes Relative: 60.5 % — ABNORMAL HIGH (ref 12.0–46.0)
Lymphs Abs: 7.1 10*3/uL — ABNORMAL HIGH (ref 0.7–4.0)
MCHC: 32.8 g/dL (ref 30.0–36.0)
MCV: 88.8 fl (ref 78.0–100.0)
Monocytes Absolute: 0.7 10*3/uL (ref 0.1–1.0)
Monocytes Relative: 6.4 % (ref 3.0–12.0)
Neutro Abs: 3.6 10*3/uL (ref 1.4–7.7)
Neutrophils Relative %: 30.5 % — ABNORMAL LOW (ref 43.0–77.0)
Platelets: 166 10*3/uL (ref 150.0–400.0)
RBC: 4.5 Mil/uL (ref 3.87–5.11)
RDW: 13.3 % (ref 11.5–15.5)
WBC: 11.7 10*3/uL — ABNORMAL HIGH (ref 4.0–10.5)

## 2021-07-30 LAB — COMPREHENSIVE METABOLIC PANEL
ALT: 25 U/L (ref 0–35)
AST: 23 U/L (ref 0–37)
Albumin: 4.6 g/dL (ref 3.5–5.2)
Alkaline Phosphatase: 54 U/L (ref 39–117)
BUN: 18 mg/dL (ref 6–23)
CO2: 29 mEq/L (ref 19–32)
Calcium: 9.5 mg/dL (ref 8.4–10.5)
Chloride: 102 mEq/L (ref 96–112)
Creatinine, Ser: 0.66 mg/dL (ref 0.40–1.20)
GFR: 89.92 mL/min (ref >60.00)
Glucose, Bld: 94 mg/dL (ref 70–99)
Potassium: 3.9 mEq/L (ref 3.5–5.1)
Sodium: 139 mEq/L (ref 135–145)
Total Bilirubin: 0.9 mg/dL (ref 0.2–1.2)
Total Protein: 6.9 g/dL (ref 6.0–8.3)

## 2021-07-30 LAB — TSH: TSH: 4.55 u[IU]/mL (ref 0.35–5.50)

## 2021-07-30 LAB — HEMOGLOBIN A1C: Hgb A1c MFr Bld: 5.8 % (ref 4.6–6.5)

## 2021-07-30 MED ORDER — TRAZODONE HCL 50 MG PO TABS: 25.0000 mg | ORAL_TABLET | Freq: Every evening | ORAL | 3 refills | Status: DC | PRN

## 2021-07-30 NOTE — Assessment & Plan Note (Signed)
Some caregiver stress -husband with parkinson's  Good support Offered counseling ref

## 2021-07-30 NOTE — Progress Notes (Signed)
Subjective:    Patient ID: Anita Buckley, female    DOB: [birth year], 70 y.o.   MRN: [id number]  This visit occurred during the SARS-CoV-2 public health emergency.  Safety protocols were in place, including screening questions prior to the visit, additional usage of staff PPE, and extensive cleaning of exam room while observing appropriate contact time as indicated for disinfecting solutions.   HPI Pt presents for annual f/u of chronic medical problems   Wt Readings from Last 3 Encounters:  07/30/21 155 lb 6 oz (70.5 kg)  04/08/21 152 lb (68.9 kg)  07/29/20 152 lb 8 oz (69.2 kg)   25.46 kg/m  Doing ok  Caregiving and some burnout /exhaustion from that  (caring for husband with parkinsons) Found a new caregiver -happy with that  Getting adjusted  Needs to take care of herself   Some problems with sleeping  Tries not to take xanax  Has not tried benadryl for sleep    She tried CBD oil -did not work  Copywriter, advertising in retrospect she has always had ADD and it manifests with word recall issues  Watches this  Never had testing- runs in the family    Mammogram 5/22  Self breast exam- no lumps  No gyn problems   Colonoscopy 04/2016 with 10 y recall   Dexa 1/22 osteopenia  Took 5 y of alendronate in the paste  Falls   HTN bp is stable today  No cp or palpitations or headaches or edema  No side effects to medicines  BP Readings from Last 3 Encounters:  07/30/21 130/83  04/08/21 (!) 145/91  07/29/20 135/88    Amlodipine 5 mg daily   CLL Lab Results  Component Value Date   WBC 12.3 (H) 04/08/2021   HGB 13.4 04/08/2021   HCT 41.5 04/08/2021   MCV 89.6 04/08/2021   PLT 182 04/08/2021   Utd with her oncology f/u Needs labs today  F/u due in august    Prediabetes Lab Results  Component Value Date   HGBA1C 5.7 07/29/2020    Hyperlipidemia Lab Results  Component Value Date   CHOL 237 (H) 11/12/2020   HDL 57.20 11/12/2020   LDLCALC 157 (H) 11/12/2020    LDLDIRECT 126.4 06/28/2012   TRIG 114.0 11/12/2020   CHOLHDL 4 11/12/2020  Crestor 10 mg daily   Diet has been pretty good   Patient Active Problem List   Diagnosis Date Noted   Insomnia 07/30/2021   Thyroid nodule 10/12/2019   Welcome to Medicare preventive visit 03/01/2018   Tendinitis of hand 02/08/2018   Glaucoma (increased eye pressure) 07/04/2017   Encounter for annual routine gynecological examination 03/01/2017   Chronic lymphocytic leukemia (CLL), B-cell ([place]) 02/11/2016   Colon cancer screening 01/25/2015   Estrogen deficiency 01/25/2015   Lymphoma ([place]) 06/04/2014   Osteopenia 01/06/2013   HTN (hypertension), benign 06/28/2012   Other screening mammogram 01/05/2012   Gynecological examination 01/05/2012   Post-menopause 01/05/2012   Routine general medical examination at a health care facility 12/31/2011   PURE HYPERCHOLESTEROLEMIA 08/15/2008   Anxiety state 12/08/2007   FIBROCYSTIC BREAST DISEASE 03/30/2007   Prediabetes 03/30/2007   Past Medical History:  Diagnosis Date   Anxiety    Cancer ([place])    B CELL LYMPHOMA   Chronic lymphocytic leukemia (CLL), B-cell ([place]) 02/11/2016   Depression    History of anxiety    History of depression    History of fibrocystic disease of breast    History of hyperlipidemia  Hypertension    Situational stress    Past Surgical History:  Procedure Laterality Date   COLONOSCOPY  12/2005   polyps, tics   COLONOSCOPY WITH PROPOFOL N/A 05/11/2016   Procedure: COLONOSCOPY WITH PROPOFOL;  Surgeon: Manya Silvas, MD;  Location: [facility] ENDOSCOPY;  Service: Endoscopy;  Laterality: N/A;   THYROIDECTOMY Left 08/20/2015   Procedure: THYROIDECTOMY/ HEMITHYROIDECTOMY;  Surgeon: Beverly Gust, MD;  Location: [facility] ORS;  Service: ENT;  Laterality: Left;   TUBAL LIGATION     Social History   Tobacco Use   Smoking status: Never   Smokeless tobacco: Never  Vaping Use   Vaping Use: Never used  Substance Use Topics   Alcohol use: Yes     Alcohol/week: 5.0 - 10.0 standard drinks    Types: 5 - 10 Glasses of wine per week    Comment: occ (wine with dinner)   Drug use: No   Family History  Problem Relation Age of Onset   Diabetes Father        late in life   Alcohol abuse Father    Hyperlipidemia Mother    Mental illness Mother    Depression Sister    Leukemia Brother    Bladder Cancer Brother    Schizophrenia Other        Grandfather   Breast cancer Neg Hx    Allergies  Allergen Reactions   Bee Venom     Anaphylaxis    Buspirone Hcl     REACTION: did not like   Minocycline Nausea And Vomiting   Penicillins     REACTION: reaction as a child   Current Outpatient Medications on File Prior to Visit  Medication Sig Dispense Refill   ALPRAZolam (XANAX) 0.5 MG tablet TAKE 1 TABLET BY MOUTH EVERY DAY AS NEEDED FOR ANXIETY 30 tablet 0   amLODipine (NORVASC) 5 MG tablet TAKE 1 TABLET (5 MG TOTAL) BY MOUTH DAILY. 90 tablet 3   Calcium Citrate-Vitamin D (CITRACAL + D PO) Take 1-2 capsules by mouth daily.      EPINEPHrine 0.3 mg/0.3 mL IJ SOAJ injection INJECT 0.3 ML INTO MUSCLE AS NEEDED 2 each 3   famotidine (PEPCID) 10 MG tablet Take 1 tablet (10 mg total) by mouth daily.     ibuprofen (ADVIL) 200 MG tablet Take 200 mg by mouth every 4 (four) hours as needed.     latanoprost (XALATAN) 0.005 % ophthalmic solution Place 1 drop into both eyes at bedtime.     rosuvastatin (CRESTOR) 10 MG tablet Take 1 tablet (10 mg total) by mouth daily. 90 tablet 3   timolol (TIMOPTIC) 0.5 % ophthalmic solution 1 drop daily.     No current facility-administered medications on file prior to visit.    Review of Systems  Constitutional:  Positive for fatigue. Negative for activity change, appetite change, fever and unexpected weight change.  HENT:  Negative for congestion, ear pain, rhinorrhea, sinus pressure and sore throat.   Eyes:  Negative for pain, redness and visual disturbance.  Respiratory:  Negative for cough, shortness of  breath and wheezing.   Cardiovascular:  Negative for chest pain and palpitations.  Gastrointestinal:  Negative for abdominal pain, blood in stool, constipation and diarrhea.  Endocrine: Negative for polydipsia and polyuria.  Genitourinary:  Negative for dysuria, frequency and urgency.  Musculoskeletal:  Negative for arthralgias, back pain and myalgias.  Skin:  Negative for pallor and rash.  Allergic/Immunologic: Negative for environmental allergies.  Neurological:  Negative for dizziness, syncope and  headaches.  Hematological:  Negative for adenopathy. Does not bruise/bleed easily.  Psychiatric/Behavioral:  Positive for sleep disturbance. Negative for decreased concentration and dysphoric mood. The patient is not nervous/anxious.        Caregiver stress      Objective:   Physical Exam Constitutional:      General: She is not in acute distress.    Appearance: Normal appearance. She is well-developed and normal weight. She is not ill-appearing or diaphoretic.  HENT:     Head: Normocephalic and atraumatic.     Right Ear: Tympanic membrane, ear canal and external ear normal.     Left Ear: Tympanic membrane, ear canal and external ear normal.     Nose: Nose normal. No congestion.     Mouth/Throat:     Mouth: Mucous membranes are moist.     Pharynx: Oropharynx is clear. No posterior oropharyngeal erythema.  Eyes:     General: No scleral icterus.    Extraocular Movements: Extraocular movements intact.     Conjunctiva/sclera: Conjunctivae normal.     Pupils: Pupils are equal, round, and reactive to light.  Neck:     Thyroid: No thyromegaly.     Vascular: No carotid bruit or JVD.  Cardiovascular:     Rate and Rhythm: Normal rate and regular rhythm.     Pulses: Normal pulses.     Heart sounds: Normal heart sounds.    No gallop.  Pulmonary:     Effort: Pulmonary effort is normal. No respiratory distress.     Breath sounds: Normal breath sounds. No wheezing.     Comments: Good air  exch Chest:     Chest wall: No tenderness.  Abdominal:     General: Bowel sounds are normal. There is no distension or abdominal bruit.     Palpations: Abdomen is soft. There is no mass.     Tenderness: There is no abdominal tenderness.     Hernia: No hernia is present.  Genitourinary:    Comments: Breast exam: No mass, nodules, thickening, tenderness, bulging, retraction, inflamation, nipple discharge or skin changes noted.  No axillary or clavicular LA.     Musculoskeletal:        General: No tenderness. Normal range of motion.     Cervical back: Normal range of motion and neck supple. No rigidity. No muscular tenderness.     Right lower leg: No edema.     Left lower leg: No edema.     Comments: No kyphosis   Lymphadenopathy:     Cervical: No cervical adenopathy.  Skin:    General: Skin is warm and dry.     Coloration: Skin is not pale.     Findings: No erythema or rash.     Comments: Solar lentigines diffusely   Neurological:     Mental Status: She is alert. Mental status is at baseline.     Cranial Nerves: No cranial nerve deficit.     Motor: No abnormal muscle tone.     Coordination: Coordination normal.     Gait: Gait normal.     Deep Tendon Reflexes: Reflexes are normal and symmetric. Reflexes normal.  Psychiatric:        Mood and Affect: Mood normal.        Cognition and Memory: Cognition and memory normal.          Assessment & Plan:   Problem List Items Addressed This Visit       Cardiovascular and Mediastinum   HTN (hypertension),  benign - Primary    bp in fair control at this time  BP Readings from Last 1 Encounters:  07/30/21 130/83  No changes needed Most recent labs reviewed  Disc lifstyle change with low sodium diet and exercise  Plan to continue amlodipine 5 mg daily      Relevant Orders   CBC with Differential/Platelet   Comprehensive metabolic panel   Lipid panel   TSH     Other   PURE HYPERCHOLESTEROLEMIA    Labs ordered Disc goals  for lipids and reasons to control them Rev last labs with pt Rev low sat fat diet in detail Fair diet       Relevant Orders   Lipid panel   Anxiety state    Some caregiver stress -husband with parkinson's  Good support Offered counseling ref       Relevant Medications   traZODone (DESYREL) 50 MG tablet   Prediabetes    A1C ordered disc imp of low glycemic diet and wt loss to prevent DM2       Relevant Orders   Hemoglobin A1c   Chronic lymphocytic leukemia (CLL), B-cell (H[facility])    Stable at last onc visit  Cbc today  Continue to follow       Relevant Orders   CBC with Differential/Platelet   Insomnia    Does not want to use xanax for this  Caregiver stress-husband with parkinson's  Intol of antihistamine Px trazodone 50 mg prn  Given handout re: med  Also enc good sleep hygeine

## 2021-07-30 NOTE — Patient Instructions (Addendum)
Try trazodone for sleep   Use caution  Add exercise when you can   Labs today   For healthy diet  Try to get most of your carbohydrates from produce (with the exception of white potatoes)  Eat less bread/pasta/rice/snack foods/cereals/sweets and other items from the middle of the grocery store (processed carbs)

## 2021-07-30 NOTE — Assessment & Plan Note (Signed)
Stable at last onc visit  Cbc today  Continue to follow

## 2021-07-30 NOTE — Assessment & Plan Note (Signed)
bp in fair control at this time  BP Readings from Last 1 Encounters:  07/30/21 130/83   No changes needed Most recent labs reviewed  Disc lifstyle change with low sodium diet and exercise  Plan to continue amlodipine 5 mg daily

## 2021-07-30 NOTE — Assessment & Plan Note (Signed)
A1C ordered °disc imp of low glycemic diet and wt loss to prevent DM2  °

## 2021-07-30 NOTE — Assessment & Plan Note (Signed)
Does not want to use xanax for this  Caregiver stress-husband with parkinson's  Intol of antihistamine Px trazodone 50 mg prn  Given handout re: med  Also enc good sleep hygeine

## 2021-07-30 NOTE — Assessment & Plan Note (Signed)
Labs ordered Disc goals for lipids and reasons to control them Rev last labs with pt Rev low sat fat diet in detail Fair diet

## 2021-08-22 ENCOUNTER — Other Ambulatory Visit: Payer: Self-pay | Admitting: Family Medicine

## 2021-08-27 IMAGING — MG MM DIGITAL SCREENING BILAT W/ TOMO AND CAD
8 series · 8 of 24 positions shown · non-contrast
Comparison: Previous exam(s).

CLINICAL DATA: Screening.

EXAM:
DIGITAL SCREENING BILATERAL MAMMOGRAM WITH TOMOSYNTHESIS AND CAD
TECHNIQUE: Bilateral screening digital craniocaudal and mediolateral oblique
mammograms were obtained. Bilateral screening digital breast
tomosynthesis was performed. The images were evaluated with
computer-aided detection.

[L MLO synth-2D]
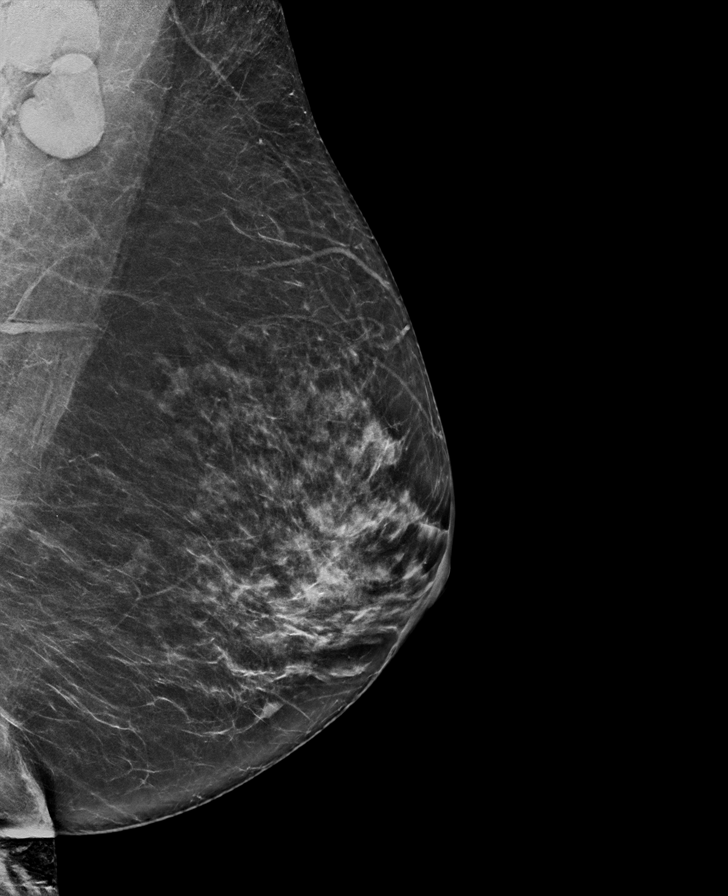

[R MLO synth-2D]
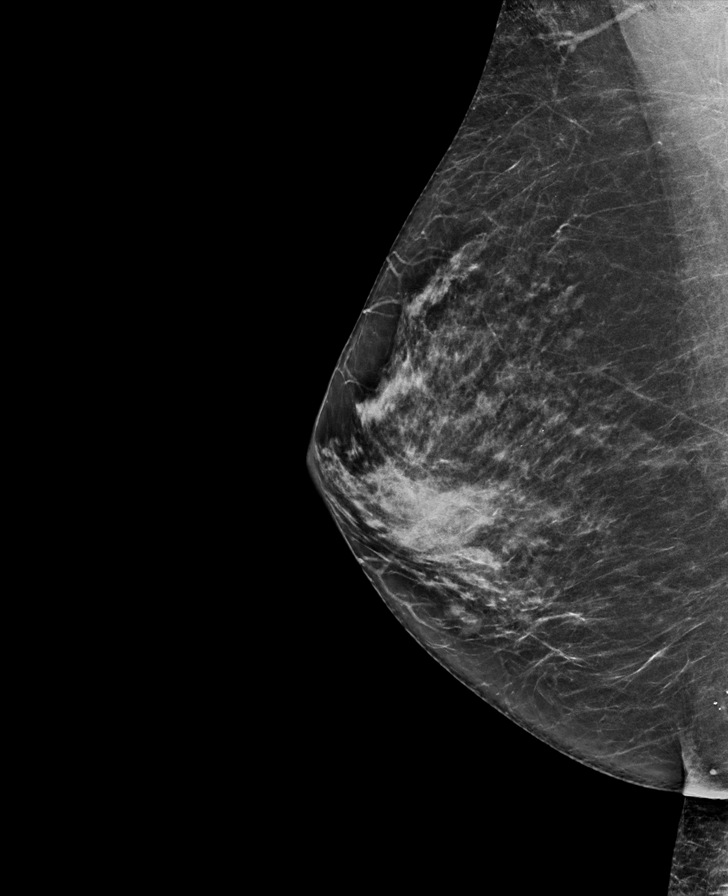

[R CC synth-2D]
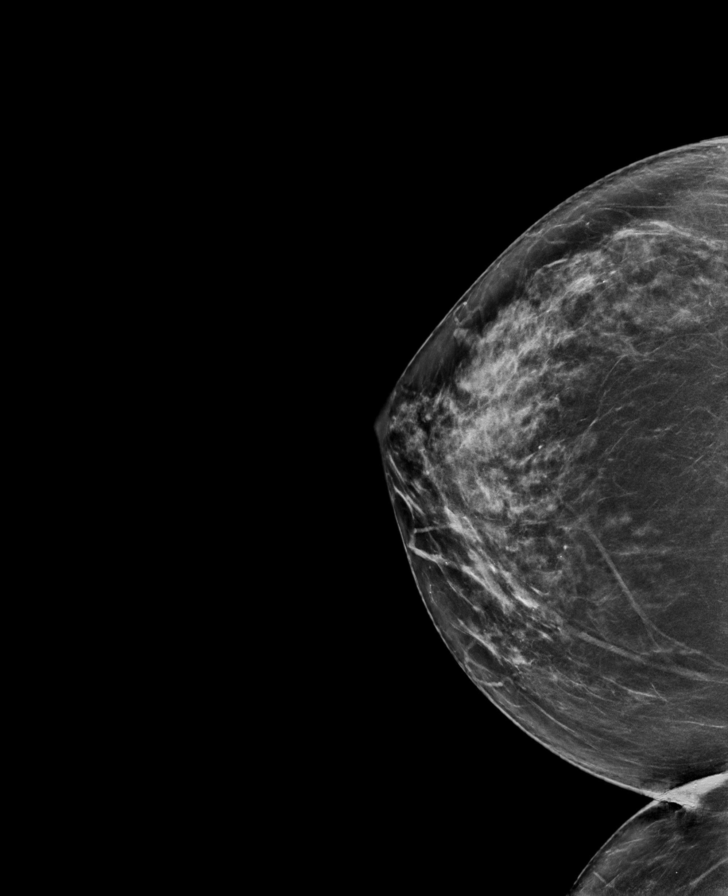

[L CC synth-2D]
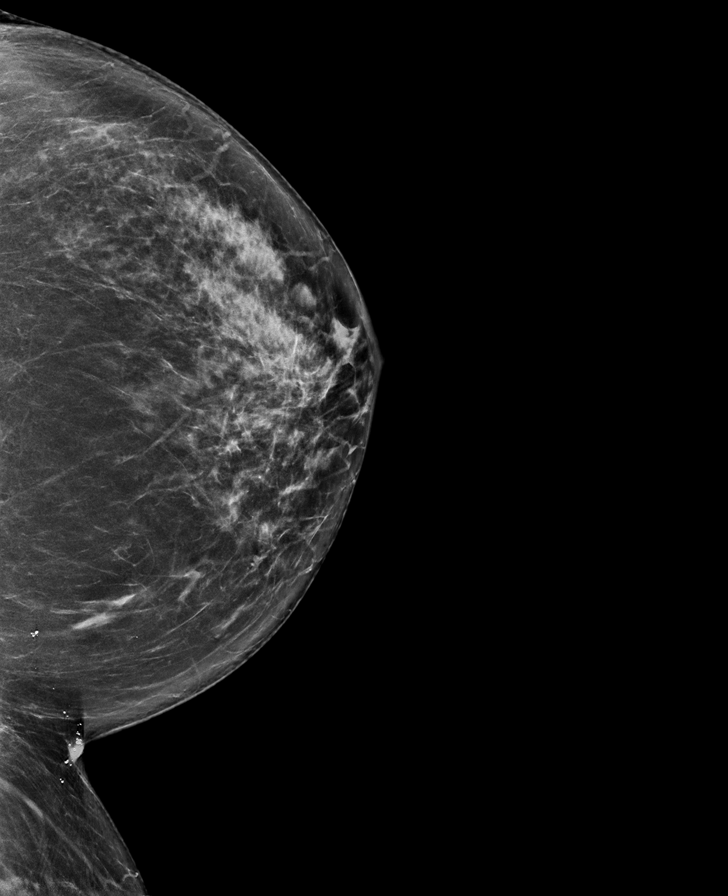

[R MLO tomo · tomo slice 33/64.0]
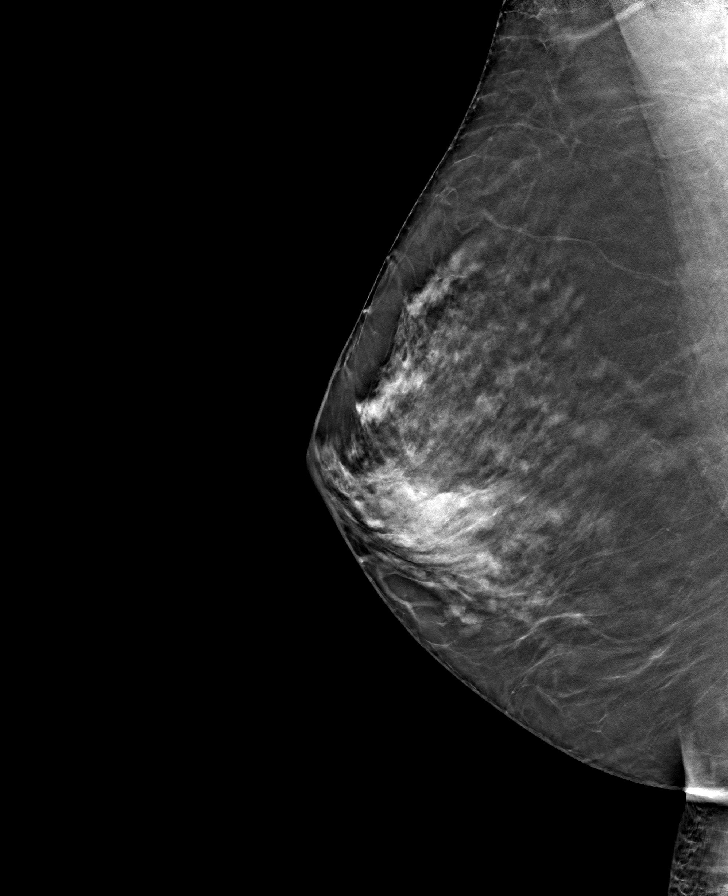

[L CC tomo · tomo slice 36/71.0]
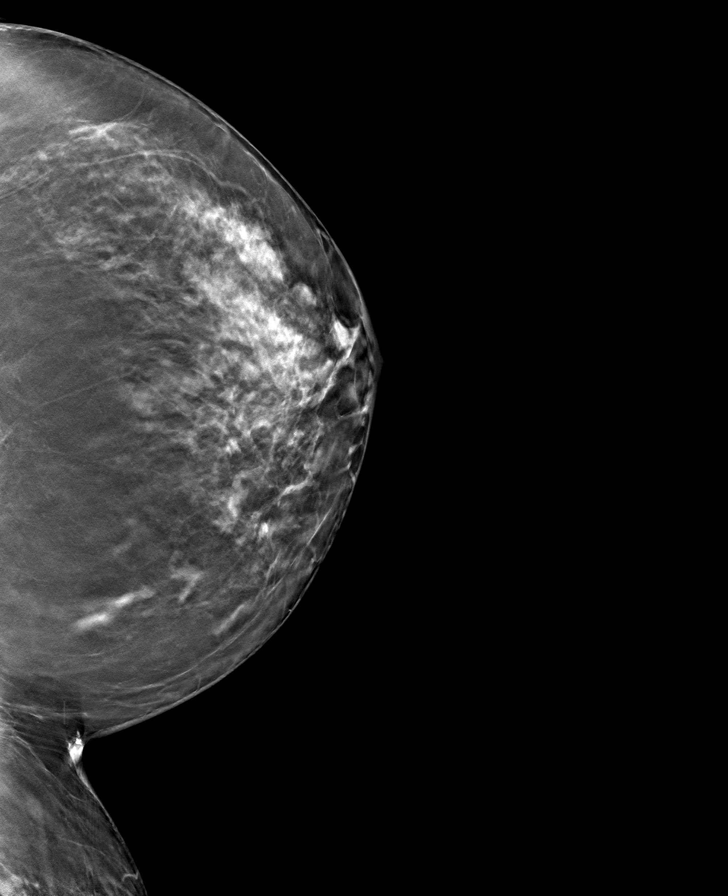

[R CC tomo · tomo slice 35/69.0]
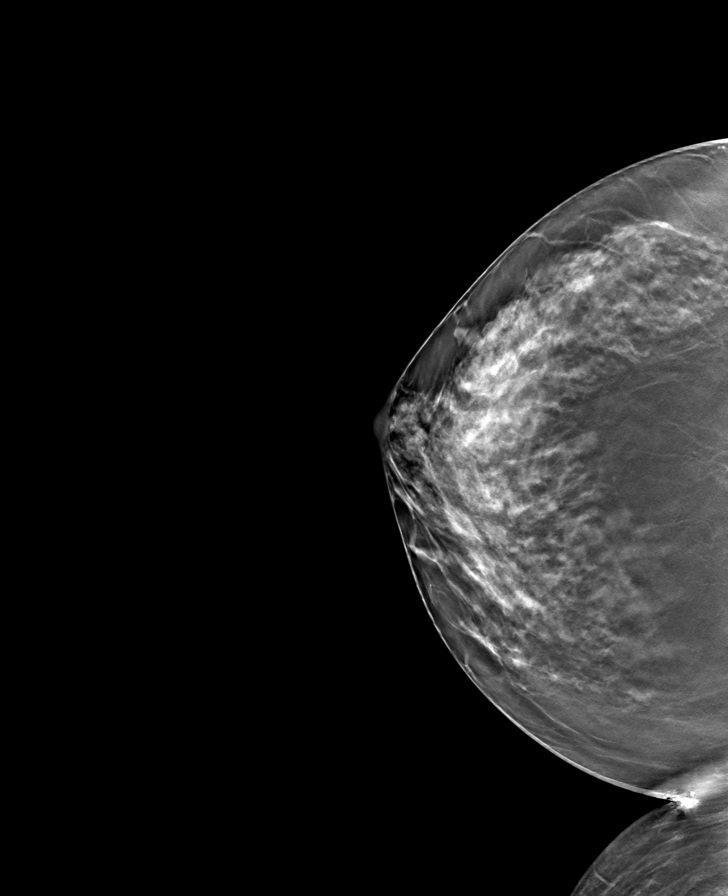

[L MLO tomo · tomo slice 35/70.0]
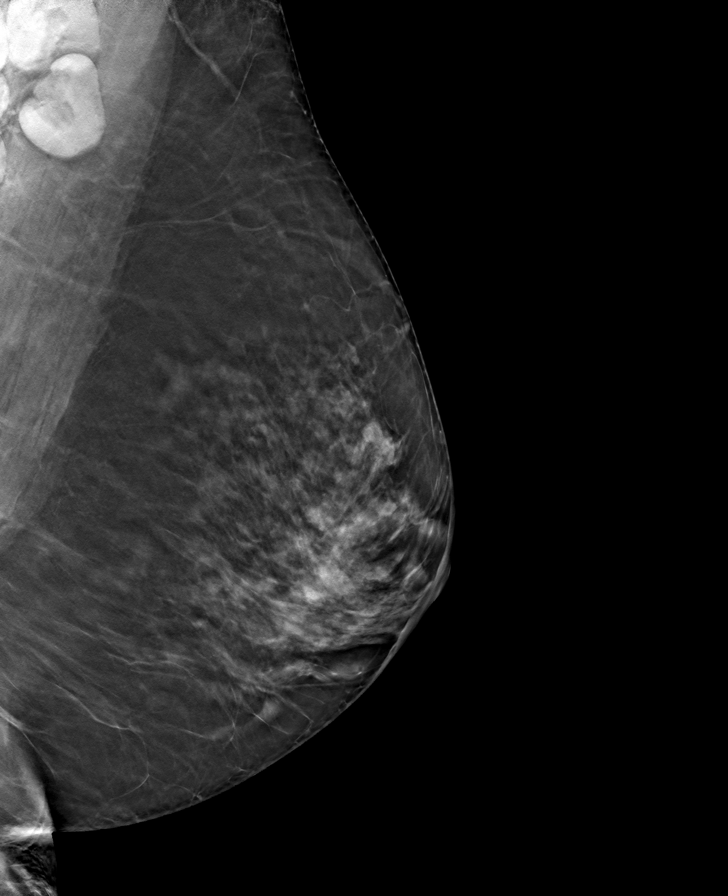

[8 of 24 positions shown; findings below may reference images not displayed]

ACR Breast Density Category c: The breast tissue is heterogeneously
dense, which may obscure small masses.
FINDINGS: There are no findings suspicious for malignancy. The images were
evaluated with computer-aided detection.
IMPRESSION: No mammographic evidence of malignancy. A result letter of this
screening mammogram will be mailed directly to the patient.

RECOMMENDATION:
Screening mammogram in one year. (Code:T4-5-GWO)

BI-RADS CATEGORY  1: Negative.

## 2021-10-10 ENCOUNTER — Encounter: Payer: Self-pay | Admitting: Family Medicine

## 2021-10-24 ENCOUNTER — Other Ambulatory Visit: Payer: Self-pay | Admitting: Family Medicine

## 2021-11-20 DIAGNOSIS — H40001 Preglaucoma, unspecified, right eye: Secondary | ICD-10-CM | POA: Diagnosis not present

## 2021-11-24 ENCOUNTER — Other Ambulatory Visit: Payer: Self-pay | Admitting: Family Medicine

## 2021-11-25 MED ORDER — ALPRAZOLAM 0.5 MG PO TABS: 0.5000 mg | ORAL_TABLET | Freq: Every day | ORAL | 0 refills | Status: DC | PRN

## 2021-11-25 NOTE — Telephone Encounter (Signed)
Name of Medication: Xanax ?Name of Pharmacy: [facility] [address] ?Last Fill or Written Date and Quantity: 02/04/21 #30 tabs/ 0 refill ?Last Office Visit and Type: CPE on 07/30/21 ?Next Office Visit and Type: CPE on 07/31/22 ? ?  ?

## 2021-12-08 DIAGNOSIS — Z20822 Contact with and (suspected) exposure to covid-19: Secondary | ICD-10-CM | POA: Diagnosis not present

## 2021-12-18 DIAGNOSIS — Z20822 Contact with and (suspected) exposure to covid-19: Secondary | ICD-10-CM | POA: Diagnosis not present

## 2021-12-20 DIAGNOSIS — Z23 Encounter for immunization: Secondary | ICD-10-CM | POA: Diagnosis not present

## 2022-01-23 ENCOUNTER — Encounter: Payer: Self-pay | Admitting: Family Medicine

## 2022-01-23 MED ORDER — EPINEPHRINE 0.3 MG/0.3ML IJ SOAJ: INTRAMUSCULAR | 3 refills | Status: DC

## 2022-01-23 MED ORDER — MELOXICAM 7.5 MG PO TABS: 7.5000 mg | ORAL_TABLET | Freq: Two times a day (BID) | ORAL | 3 refills | Status: DC | PRN

## 2022-02-03 ENCOUNTER — Ambulatory Visit: Payer: Medicare Other

## 2022-02-10 ENCOUNTER — Ambulatory Visit (INDEPENDENT_AMBULATORY_CARE_PROVIDER_SITE_OTHER): Payer: Medicare Other | Admitting: *Deleted

## 2022-02-10 DIAGNOSIS — Z0000 Encounter for general adult medical examination without abnormal findings: Secondary | ICD-10-CM

## 2022-02-10 DIAGNOSIS — Z1231 Encounter for screening mammogram for malignant neoplasm of breast: Secondary | ICD-10-CM

## 2022-02-13 ENCOUNTER — Ambulatory Visit: Payer: Medicare Other

## 2022-04-10 ENCOUNTER — Other Ambulatory Visit: Payer: Self-pay

## 2022-04-10 DIAGNOSIS — C8300 Small cell B-cell lymphoma, unspecified site: Secondary | ICD-10-CM

## 2022-04-13 ENCOUNTER — Encounter: Payer: Self-pay | Admitting: Oncology

## 2022-04-13 ENCOUNTER — Inpatient Hospital Stay (HOSPITAL_BASED_OUTPATIENT_CLINIC_OR_DEPARTMENT_OTHER): Payer: Medicare Other | Admitting: Oncology

## 2022-04-13 ENCOUNTER — Inpatient Hospital Stay: Payer: Medicare Other | Attending: Oncology

## 2022-04-13 VITALS — BP 143/96 | HR 64 | Temp 98.0°F | Resp 18 | Wt 150.5 lb

## 2022-04-13 DIAGNOSIS — C8300 Small cell B-cell lymphoma, unspecified site: Secondary | ICD-10-CM | POA: Diagnosis not present

## 2022-04-13 DIAGNOSIS — E785 Hyperlipidemia, unspecified: Secondary | ICD-10-CM | POA: Diagnosis not present

## 2022-04-13 DIAGNOSIS — Z79899 Other long term (current) drug therapy: Secondary | ICD-10-CM | POA: Insufficient documentation

## 2022-04-13 DIAGNOSIS — C9110 Chronic lymphocytic leukemia of B-cell type not having achieved remission: Secondary | ICD-10-CM | POA: Diagnosis not present

## 2022-04-13 DIAGNOSIS — I10 Essential (primary) hypertension: Secondary | ICD-10-CM | POA: Diagnosis not present

## 2022-04-13 LAB — COMPREHENSIVE METABOLIC PANEL
ALT: 19 U/L (ref 0–44)
AST: 21 U/L (ref 15–41)
Albumin: 4.7 g/dL (ref 3.5–5.0)
Alkaline Phosphatase: 51 U/L (ref 38–126)
Anion gap: 6 (ref 5–15)
BUN: 20 mg/dL (ref 8–23)
CO2: 29 mmol/L (ref 22–32)
Calcium: 9.4 mg/dL (ref 8.9–10.3)
Chloride: 103 mmol/L (ref 98–111)
Creatinine, Ser: 0.63 mg/dL (ref 0.44–1.00)
GFR, Estimated: >60 mL/min (ref >60)
Glucose, Bld: 94 mg/dL (ref 70–99)
Potassium: 3.8 mmol/L (ref 3.5–5.1)
Sodium: 138 mmol/L (ref 135–145)
Total Bilirubin: 0.7 mg/dL (ref 0.3–1.2)
Total Protein: 7.3 g/dL (ref 6.5–8.1)

## 2022-04-13 LAB — CBC WITH DIFFERENTIAL/PLATELET
Abs Immature Granulocytes: 0.03 10*3/uL (ref 0.00–0.07)
Basophils Absolute: 0.1 10*3/uL (ref 0.0–0.1)
Basophils Relative: 0 %
Eosinophils Absolute: 0.2 10*3/uL (ref 0.0–0.5)
Eosinophils Relative: 1 %
HCT: 42 % (ref 36.0–46.0)
Hemoglobin: 13.5 g/dL (ref 12.0–15.0)
Immature Granulocytes: 0 %
Lymphocytes Relative: 61 %
Lymphs Abs: 8.7 10*3/uL — ABNORMAL HIGH (ref 0.7–4.0)
MCH: 29.2 pg (ref 26.0–34.0)
MCHC: 32.1 g/dL (ref 30.0–36.0)
MCV: 90.9 fL (ref 80.0–100.0)
Monocytes Absolute: 1 10*3/uL (ref 0.1–1.0)
Monocytes Relative: 7 %
Neutro Abs: 4.4 10*3/uL (ref 1.7–7.7)
Neutrophils Relative %: 31 %
Platelets: 184 10*3/uL (ref 150–400)
RBC: 4.62 MIL/uL (ref 3.87–5.11)
RDW: 12.6 % (ref 11.5–15.5)
Smear Review: NORMAL
WBC: 14.3 10*3/uL — ABNORMAL HIGH (ref 4.0–10.5)
nRBC: 0 % (ref 0.0–0.2)

## 2022-04-13 LAB — LACTATE DEHYDROGENASE: LDH: 125 U/L (ref 98–192)

## 2022-04-13 NOTE — Progress Notes (Signed)
Hematology/Oncology Consult note [facility]  [phone] [phone]  Patient Care Team: Abner Greenspan, MD as PCP - General   Name of the patient: Anita Buckley  [id number]  03-03-1951   Date of visit: 04/13/22  Diagnosis-SLL/CLL  Chief complaint/ Reason for visit-routine follow-up of CLL  Heme/Onc history: Patient is a 71 year old female who was diagnosed with SLL back in 2015.  She was having some problems with her thyroid gland back then an ultrasound of the soft tissue head and neck in September 2015 revealed homogeneous thyroid gland with several thyroid nodules.  Several enlarged left-sided neck lymph nodes.  FNA of the left neck no was consistent with clonal population of lymphocytes CD5 positive, CD23 positive and CD43 8+.  It was negative for malignant cells.  She also had a PET scan subsequently and her last PET scan was in 2016 which showed stable mild metabolism within a 1.2 cm level 3 lymph node but no evidence of hypermetabolism or significant adenopathy elsewhere.  CT chest in February 2018 revealed mild thoracic lymphadenopathy compatible with CLL.     Interval history-patient is doing well overall.  Appetite and weight have remained stable.  She denies any B symptoms.  She feels that her neck nodes are overall stable and have not grown in size  ECOG PS- 0 Pain scale- 0   Review of systems- Review of Systems  Constitutional:  Negative for chills, fever, malaise/fatigue and weight loss.  HENT:  Negative for congestion, ear discharge and nosebleeds.   Eyes:  Negative for blurred vision.  Respiratory:  Negative for cough, hemoptysis, sputum production, shortness of breath and wheezing.   Cardiovascular:  Negative for chest pain, palpitations, orthopnea and claudication.  Gastrointestinal:  Negative for abdominal pain, blood in stool, constipation, diarrhea, heartburn, melena, nausea and vomiting.  Genitourinary:   Negative for dysuria, flank pain, frequency, hematuria and urgency.  Musculoskeletal:  Negative for back pain, joint pain and myalgias.  Skin:  Negative for rash.  Neurological:  Negative for dizziness, tingling, focal weakness, seizures, weakness and headaches.  Endo/Heme/Allergies:  Does not bruise/bleed easily.  Psychiatric/Behavioral:  Negative for depression and suicidal ideas. The patient does not have insomnia.       Allergies  Allergen Reactions   Bee Venom     Anaphylaxis    Buspirone Hcl     REACTION: did not like   Minocycline Nausea And Vomiting   Penicillins     REACTION: reaction as a child     Past Medical History:  Diagnosis Date   Anxiety    Cancer ([place])    B CELL LYMPHOMA   Chronic lymphocytic leukemia (CLL), B-cell ([place]) 02/11/2016   Depression    History of anxiety    History of depression    History of fibrocystic disease of breast    History of hyperlipidemia    Hypertension    Situational stress      Past Surgical History:  Procedure Laterality Date   COLONOSCOPY  12/2005   polyps, tics   COLONOSCOPY WITH PROPOFOL N/A 05/11/2016   Procedure: COLONOSCOPY WITH PROPOFOL;  Surgeon: Manya Silvas, MD;  Location: [facility] ENDOSCOPY;  Service: Endoscopy;  Laterality: N/A;   THYROIDECTOMY Left 08/20/2015   Procedure: THYROIDECTOMY/ HEMITHYROIDECTOMY;  Surgeon: Beverly Gust, MD;  Location: [facility] ORS;  Service: ENT;  Laterality: Left;   TUBAL LIGATION      Social History   Socioeconomic History   Marital status: Married  Spouse name: Not on file   Number of children: Not on file   Years of education: Not on file   Highest education level: Not on file  Occupational History   Not on file  Tobacco Use   Smoking status: Never   Smokeless tobacco: Never  Vaping Use   Vaping Use: Never used  Substance and Sexual Activity   Alcohol use: Yes    Alcohol/week: 5.0 - 10.0 standard drinks of alcohol    Types: 5 - 10 Glasses of wine per week     Comment: occ (wine with dinner)   Drug use: No   Sexual activity: Not on file  Other Topics Concern   Not on file  Social History Narrative   Lives at home in private residence with husband   Social Determinants of Health   Financial Resource Strain: Low Risk  (02/10/2022)   Overall Financial Resource Strain (CARDIA)    Difficulty of Paying Living Expenses: Not hard at all  Food Insecurity: No Food Insecurity (02/10/2022)   Hunger Vital Sign    Worried About Running Out of Food in the Last Year: Never true    Ran Out of Food in the Last Year: Never true  Transportation Needs: No Transportation Needs (02/10/2022)   PRAPARE - Hydrologist (Medical): No    Lack of Transportation (Non-Medical): No  Physical Activity: Insufficiently Active (02/10/2022)   Exercise Vital Sign    Days of Exercise per Week: 2 days    Minutes of Exercise per Session: 30 min  Stress: No Stress Concern Present (02/10/2022)   [place]    Feeling of Stress : Only a little  Social Connections: Moderately Isolated (02/10/2022)   Social Connection and Isolation Panel [NHANES]    Frequency of Communication with Friends and Family: More than three times a week    Frequency of Social Gatherings with Friends and Family: Once a week    Attends Religious Services: Never    Marine scientist or Organizations: No    Attends Archivist Meetings: Never    Marital Status: Married  Human resources officer Violence: Not At Risk (02/10/2022)   Humiliation, Afraid, Rape, and Kick questionnaire    Fear of Current or Ex-Partner: No    Emotionally Abused: No    Physically Abused: No    Sexually Abused: No    Family History  Problem Relation Age of Onset   Diabetes Father        late in life   Alcohol abuse Father    Hyperlipidemia Mother    Mental illness Mother    Depression Sister    Leukemia Brother    Bladder Cancer  Brother    Schizophrenia Other        Grandfather   Breast cancer Neg Hx      Current Outpatient Medications:    ALPRAZolam (XANAX) 0.5 MG tablet, Take 1 tablet (0.5 mg total) by mouth daily as needed for anxiety., Disp: 30 tablet, Rfl: 0   amLODipine (NORVASC) 5 MG tablet, TAKE 1 TABLET (5 MG TOTAL) BY MOUTH DAILY., Disp: 90 tablet, Rfl: 3   Calcium Citrate-Vitamin D (CITRACAL + D PO), Take 1-2 capsules by mouth daily. , Disp: , Rfl:    EPINEPHrine 0.3 mg/0.3 mL IJ SOAJ injection, INJECT 0.3 ML INTO MUSCLE AS NEEDED, Disp: 2 each, Rfl: 3   famotidine (PEPCID) 10 MG tablet, Take 1 tablet (10  mg total) by mouth daily., Disp: , Rfl:    latanoprost (XALATAN) 0.005 % ophthalmic solution, Place 1 drop into both eyes at bedtime., Disp: , Rfl:    rosuvastatin (CRESTOR) 10 MG tablet, TAKE 1 TABLET BY MOUTH EVERY DAY, Disp: 90 tablet, Rfl: 3   timolol (TIMOPTIC) 0.5 % ophthalmic solution, 1 drop daily., Disp: , Rfl:    meloxicam (MOBIC) 7.5 MG tablet, Take 1 tablet (7.5 mg total) by mouth 2 (two) times daily as needed for pain. With food (Patient not taking: Reported on 02/10/2022), Disp: 3060 tablet, Rfl: 3  Physical exam:  Vitals:   04/13/22 1117  BP: (!) 143/96  Pulse: 64  Resp: 18  Temp: 98 F (36.7 C)  SpO2: 99%  Weight: 150 lb 8 oz (68.3 kg)   Physical Exam Cardiovascular:     Rate and Rhythm: Normal rate and regular rhythm.     Heart sounds: Normal heart sounds.  Pulmonary:     Effort: Pulmonary effort is normal.     Breath sounds: Normal breath sounds.  Abdominal:     General: Bowel sounds are normal.     Palpations: Abdomen is soft.  Lymphadenopathy:     Comments:  palpable left supraclavicular adenopathy left greater than right  Skin:    General: Skin is warm and dry.  Neurological:     Mental Status: She is alert and oriented to person, place, and time.         Latest Ref Rng & Units 04/13/2022   10:56 AM  CMP  Glucose 70 - 99 mg/dL 94   BUN 8 - 23 mg/dL 20    Creatinine 0.44 - 1.00 mg/dL 0.63   Sodium 135 - 145 mmol/L 138   Potassium 3.5 - 5.1 mmol/L 3.8   Chloride 98 - 111 mmol/L 103   CO2 22 - 32 mmol/L 29   Calcium 8.9 - 10.3 mg/dL 9.4   Total Protein 6.5 - 8.1 g/dL 7.3   Total Bilirubin 0.3 - 1.2 mg/dL 0.7   Alkaline Phos 38 - 126 U/L 51   AST 15 - 41 U/L 21   ALT 0 - 44 U/L 19       Latest Ref Rng & Units 04/13/2022   10:56 AM  CBC  WBC 4.0 - 10.5 K/uL 14.3   Hemoglobin 12.0 - 15.0 g/dL 13.5   Hematocrit 36.0 - 46.0 % 42.0   Platelets 150 - 400 K/uL 184     No images are attached to the encounter.  No results found.   Assessment and plan- Patient is a 71 y.o. female here for routine follow-up of SLL/CLL  So far patient has had SLL findings in her lymph node without significant peripheral lymphocytosis.  Her white cell count has gradually been going up over the last 8 months and is presently at 14.3.  Her absolute lymphocyte count is presently 8.7 as compared to 5.7 in December 2021.  This will be more consistent with CLL at this time which remains a stage 0 given that her hemoglobin and platelets are still normal.  No palpable splenomegaly or B symptoms.  She does not require any treatment for her CLL at this time.  I will repeat labs in 6 months in 1 year and see her back in 1 year   Visit Diagnosis 1. Lymphoma, small lymphocytic ([place])      Dr. Randa Evens, MD, MPH [facility] [phone] 04/13/2022 3:55 PM

## 2022-04-20 ENCOUNTER — Encounter: Payer: Self-pay | Admitting: Oncology

## 2022-04-23 ENCOUNTER — Encounter: Payer: Self-pay | Admitting: Family Medicine

## 2022-04-23 DIAGNOSIS — K5730 Diverticulosis of large intestine without perforation or abscess without bleeding: Secondary | ICD-10-CM | POA: Diagnosis not present

## 2022-04-23 DIAGNOSIS — K6289 Other specified diseases of anus and rectum: Secondary | ICD-10-CM | POA: Diagnosis not present

## 2022-04-23 DIAGNOSIS — K640 First degree hemorrhoids: Secondary | ICD-10-CM | POA: Diagnosis not present

## 2022-04-23 DIAGNOSIS — Z8371 Family history of colonic polyps: Secondary | ICD-10-CM | POA: Diagnosis not present

## 2022-04-23 DIAGNOSIS — Z1211 Encounter for screening for malignant neoplasm of colon: Secondary | ICD-10-CM | POA: Diagnosis not present

## 2022-04-23 LAB — HM COLONOSCOPY

## 2022-04-28 ENCOUNTER — Ambulatory Visit
Admission: RE | Admit: 2022-04-28 | Discharge: 2022-04-28 | Disposition: A | Discharge status: Home / Self Care | Payer: Medicare Other | Source: Ambulatory Visit | Attending: Family Medicine | Admitting: Family Medicine

## 2022-04-28 ENCOUNTER — Encounter: Payer: Self-pay | Admitting: Family Medicine

## 2022-04-28 ENCOUNTER — Ambulatory Visit (INDEPENDENT_AMBULATORY_CARE_PROVIDER_SITE_OTHER): Payer: Medicare Other | Admitting: Family Medicine

## 2022-04-28 VITALS — BP 114/82 | HR 77 | Temp 97.7°F | Ht 65.5 in | Wt 150.2 lb

## 2022-04-28 DIAGNOSIS — F411 Generalized anxiety disorder: Secondary | ICD-10-CM | POA: Diagnosis not present

## 2022-04-28 DIAGNOSIS — Z1231 Encounter for screening mammogram for malignant neoplasm of breast: Secondary | ICD-10-CM | POA: Insufficient documentation

## 2022-04-28 DIAGNOSIS — F5104 Psychophysiologic insomnia: Secondary | ICD-10-CM | POA: Diagnosis not present

## 2022-04-28 MED ORDER — ESCITALOPRAM OXALATE 10 MG PO TABS: 10.0000 mg | ORAL_TABLET | Freq: Every day | ORAL | 3 refills | Status: DC

## 2022-04-28 MED ORDER — ALPRAZOLAM 0.5 MG PO TABS: 0.5000 mg | ORAL_TABLET | Freq: Every day | ORAL | 0 refills | Status: DC | PRN

## 2022-04-28 NOTE — Progress Notes (Signed)
Subjective:    Patient ID: Anita Buckley, female    DOB: 10-27-1950, 71 y.o.   MRN: [id number]  HPI Pt presents for c/o anxiety symptoms   Wt Readings from Last 3 Encounters:  04/28/22 150 lb 4 oz (68.2 kg)  04/13/22 150 lb 8 oz (68.3 kg)  07/30/21 155 lb 6 oz (70.5 kg)   24.62 kg/m   More anxious symptoms Makes her physically uncomfortable and out of control  More worry  Trouble concentrating  Feels like pressure (body/brain/heart) Rush of pins and needles  At times panicky feeling    That makes her depressed (fleeting)  Worse when quiet /not distracted (In bed at night) -husb is right beside her and sometimes disrupts her sleep also    Anxiety runs in family -she has dealt with it her whole life  ADHD runs in family also   Appetite : fine   Sleep-disrupted   Xanax : works well for her once in a while  Now finds herself wanting to take it in the evenings    Stressors: managing her husband's parkinsons  A not of anticipation of what is to come More responsibility  World is smaller   Also she was dx with CLL- this is ok now but afraid of future changes   Having to make financial decisions   Is afraid to plan anything  The uncertainty drives her crazy   Tried to make decision to plan trip with her daughter next May - very difficult for her   Trying to do some things for herself  Hired a caretaker 5 d per week  Gets out and walks and socializes  Gets out to shows  [place] back to [employer] with her kids  Visited her son in [place] -first time in 8 years   Support Does not hide her struggles and talks about it  Has a close friend   Has not had counseling  Done in the past  Unsure if she wants to  Would want to do in person   She wrote in a journal years ago    Took buspar years ago  Tolerated it ok - for PMS   Lexapro -daughter takes and recommended     Lab Results  Component Value Date   WBC 14.3 (H) 04/13/2022   HGB 13.5 04/13/2022    HCT 42.0 04/13/2022   MCV 90.9 04/13/2022   PLT 184 04/13/2022    Breathing techniques  Yoga 3-4 times per week Meditation  Music  Plays with water colors   Patient Active Problem List   Diagnosis Date Noted   Insomnia 07/30/2021   Thyroid nodule 10/12/2019   Welcome to Medicare preventive visit 03/01/2018   Tendinitis of hand 02/08/2018   Glaucoma (increased eye pressure) 07/04/2017   Encounter for annual routine gynecological examination 03/01/2017   Chronic lymphocytic leukemia (CLL), B-cell ([place]) 02/11/2016   Colon cancer screening 01/25/2015   Estrogen deficiency 01/25/2015   Lymphoma ([place]) 06/04/2014   Osteopenia 01/06/2013   HTN (hypertension), benign 06/28/2012   Other screening mammogram 01/05/2012   Gynecological examination 01/05/2012   Post-menopause 01/05/2012   Routine general medical examination at a health care facility 12/31/2011   PURE HYPERCHOLESTEROLEMIA 08/15/2008   GAD (generalized anxiety disorder) 12/08/2007   FIBROCYSTIC BREAST DISEASE 03/30/2007   Prediabetes 03/30/2007   Past Medical History:  Diagnosis Date   Anxiety    Cancer ([place])    B CELL LYMPHOMA   Chronic lymphocytic leukemia (CLL), B-cell (  [place]) 02/11/2016   Depression    History of anxiety    History of depression    History of fibrocystic disease of breast    History of hyperlipidemia    Hypertension    Situational stress    Past Surgical History:  Procedure Laterality Date   COLONOSCOPY  12/2005   polyps, tics   COLONOSCOPY WITH PROPOFOL N/A 05/11/2016   Procedure: COLONOSCOPY WITH PROPOFOL;  Surgeon: Manya Silvas, MD;  Location: [facility] ENDOSCOPY;  Service: Endoscopy;  Laterality: N/A;   THYROIDECTOMY Left 08/20/2015   Procedure: THYROIDECTOMY/ HEMITHYROIDECTOMY;  Surgeon: Beverly Gust, MD;  Location: [facility] ORS;  Service: ENT;  Laterality: Left;   TUBAL LIGATION     Social History   Tobacco Use   Smoking status: Never   Smokeless tobacco: Never  Vaping Use   Vaping  Use: Never used  Substance Use Topics   Alcohol use: Yes    Alcohol/week: 5.0 - 10.0 standard drinks of alcohol    Types: 5 - 10 Glasses of wine per week    Comment: occ (wine with dinner)   Drug use: No   Family History  Problem Relation Age of Onset   Diabetes Father        late in life   Alcohol abuse Father    Hyperlipidemia Mother    Mental illness Mother    Depression Sister    Leukemia Brother    Bladder Cancer Brother    Schizophrenia Other        Grandfather   Breast cancer Neg Hx    Allergies  Allergen Reactions   Bee Venom     Anaphylaxis    Buspirone Hcl     REACTION: did not like   Minocycline Nausea And Vomiting   Penicillins     REACTION: reaction as a child   Current Outpatient Medications on File Prior to Visit  Medication Sig Dispense Refill   amLODipine (NORVASC) 5 MG tablet TAKE 1 TABLET (5 MG TOTAL) BY MOUTH DAILY. 90 tablet 3   Calcium Citrate-Vitamin D (CITRACAL + D PO) Take 1-2 capsules by mouth daily.      EPINEPHrine 0.3 mg/0.3 mL IJ SOAJ injection INJECT 0.3 ML INTO MUSCLE AS NEEDED 2 each 3   famotidine (PEPCID) 10 MG tablet Take 1 tablet (10 mg total) by mouth daily.     latanoprost (XALATAN) 0.005 % ophthalmic solution Place 1 drop into both eyes at bedtime.     meloxicam (MOBIC) 7.5 MG tablet Take 1 tablet (7.5 mg total) by mouth 2 (two) times daily as needed for pain. With food 3060 tablet 3   rosuvastatin (CRESTOR) 10 MG tablet TAKE 1 TABLET BY MOUTH EVERY DAY 90 tablet 3   timolol (TIMOPTIC) 0.5 % ophthalmic solution 1 drop daily.     No current facility-administered medications on file prior to visit.      Review of Systems  Constitutional:  Positive for fatigue. Negative for activity change, appetite change, fever and unexpected weight change.  HENT:  Negative for congestion, ear pain, rhinorrhea, sinus pressure and sore throat.   Eyes:  Negative for pain, redness and visual disturbance.  Respiratory:  Negative for cough,  shortness of breath and wheezing.   Cardiovascular:  Negative for chest pain and palpitations.  Gastrointestinal:  Negative for abdominal pain, blood in stool, constipation and diarrhea.  Endocrine: Negative for polydipsia and polyuria.  Genitourinary:  Negative for dysuria, frequency and urgency.  Musculoskeletal:  Negative for arthralgias, back pain  and myalgias.  Skin:  Negative for pallor and rash.  Allergic/Immunologic: Negative for environmental allergies.  Neurological:  Negative for dizziness, syncope and headaches.  Hematological:  Negative for adenopathy. Does not bruise/bleed easily.  Psychiatric/Behavioral:  Positive for decreased concentration, dysphoric mood and sleep disturbance. Negative for behavioral problems, confusion and suicidal ideas. The patient is nervous/anxious.        Objective:   Physical Exam Constitutional:      General: She is not in acute distress.    Appearance: Normal appearance. She is well-developed and normal weight. She is not ill-appearing or diaphoretic.  HENT:     Head: Normocephalic and atraumatic.  Eyes:     General: No scleral icterus.    Conjunctiva/sclera: Conjunctivae normal.     Pupils: Pupils are equal, round, and reactive to light.  Neck:     Thyroid: No thyromegaly.     Vascular: No carotid bruit or JVD.  Cardiovascular:     Rate and Rhythm: Normal rate and regular rhythm.     Heart sounds: Normal heart sounds.     No gallop.  Pulmonary:     Effort: Pulmonary effort is normal. No respiratory distress.     Breath sounds: Normal breath sounds. No wheezing or rales.  Abdominal:     General: There is no distension or abdominal bruit.     Palpations: Abdomen is soft.  Musculoskeletal:     Cervical back: Normal range of motion and neck supple.     Right lower leg: No edema.     Left lower leg: No edema.  Lymphadenopathy:     Cervical: No cervical adenopathy.  Skin:    General: Skin is warm and dry.     Coloration: Skin is not  pale.     Findings: No rash.  Neurological:     Mental Status: She is alert.     Coordination: Coordination normal.     Deep Tendon Reflexes: Reflexes are normal and symmetric. Reflexes normal.  Psychiatric:        Attention and Perception: Attention normal.        Mood and Affect: Mood normal.        Speech: Speech normal.        Behavior: Behavior normal.        Cognition and Memory: Cognition and memory normal.     Comments: Mildly anxious Good insight   Candidly discusses symptoms and stressors             Assessment & Plan:   Problem List Items Addressed This Visit       Other   GAD (generalized anxiety disorder) - Primary    Some degree of mild GAD (lifelong) now worsened with stress reaction (husband with parkinson's and she has CLL)  Overall good coping skills and insight  Reviewed stressors/ coping techniques/symptoms/ support sources/ tx options and side effects in detail today She does not want to overuse xanax Has done counseling several times in the past  Disc other pharm options Choose lexapro (works well for her daughter) 10 mg daily in evening -hope it will help sleep also  Discussed expectations of SSRI medication including time to effectiveness and mechanism of action, also poss of side effects (early and late)- including mental fuzziness, weight or appetite change, nausea and poss of worse dep or anxiety (even suicidal thoughts)  Pt voiced understanding and will stop med and update if this occurs  inst to call if any issues  F/u 4-6 wk  and consider inc to 20 if helpful  Strongly enc good self care -especially exercise         Relevant Medications   escitalopram (LEXAPRO) 10 MG tablet   ALPRAZolam (XANAX) 0.5 MG tablet   Insomnia    Suspect related to her anxiety and stress reaction   Plan to try lexapro  Would like to use xanax less  F/u 4-6 wk Disc sleep hygiene

## 2022-04-28 NOTE — Assessment & Plan Note (Signed)
Suspect related to her anxiety and stress reaction   Plan to try lexapro  Would like to use xanax less  F/u 4-6 wk Disc sleep hygiene

## 2022-04-28 NOTE — Assessment & Plan Note (Signed)
Some degree of mild GAD (lifelong) now worsened with stress reaction (husband with parkinson's and she has CLL)  Overall good coping skills and insight  Reviewed stressors/ coping techniques/symptoms/ support sources/ tx options and side effects in detail today She does not want to overuse xanax Has done counseling several times in the past  Disc other pharm options Choose lexapro (works well for her daughter) 10 mg daily in evening -hope it will help sleep also  Discussed expectations of SSRI medication including time to effectiveness and mechanism of action, also poss of side effects (early and late)- including mental fuzziness, weight or appetite change, nausea and poss of worse dep or anxiety (even suicidal thoughts)  Pt voiced understanding and will stop med and update if this occurs  inst to call if any issues  F/u 4-6 wk and consider inc to 20 if helpful  Strongly enc good self care -especially exercise

## 2022-04-28 NOTE — Patient Instructions (Addendum)
Let us know if you want a counseling referral   Take lexapro 10 mg in evening daily  If worse or any intolerable side effects let us know  Take care of yourself  Keep exercising and doing all the good stuff  Follow up in 4-6 weeks

## 2022-05-08 ENCOUNTER — Telehealth: Payer: Self-pay | Admitting: Family Medicine

## 2022-05-08 ENCOUNTER — Encounter: Payer: Self-pay | Admitting: Family Medicine

## 2022-05-08 NOTE — Telephone Encounter (Signed)
So sorry to hear that, please go ahead and stop it.  Pretty much all of the anxiety medications can affect eye pressure so it is important to monitor it.  Do you want to try something else?  I want to make sure the panic attacks stop (off of it) first

## 2022-05-08 NOTE — Telephone Encounter (Signed)
Patient called in to let Dr Glori Bickers  know that she thinks that rx escitalopram (LEXAPRO) 10 MG tablet is causing her panic attacks. And she wanted to ask do Dr Glori Bickers think that lexapro is a good medication for her since she has glaucoma,and should she continue to take or change medications? She has an eye appointment on October 3,and she wants to know should she ask her eye doctor about the lexapro and glaucoma as well?

## 2022-05-08 NOTE — Telephone Encounter (Signed)
Pt notified of Dr. Marliss Coots comments and recommendations. Pt said that she does want to try another med once off of the lexapro (med removed from list). Pt is asking the name of the med PCP would recommend, she wants to ask her eye doctor their recommendation on the med before starting it. Pt said we can send her the name of med PCP recommends through mychart. Pt has an eye exam on 05/22/22 so will check then and med will be out of her sxs by then.

## 2022-05-08 NOTE — Telephone Encounter (Signed)
Aware- I messaged her that I would choose buspar next

## 2022-05-16 DIAGNOSIS — Z23 Encounter for immunization: Secondary | ICD-10-CM | POA: Diagnosis not present

## 2022-05-19 DIAGNOSIS — H401121 Primary open-angle glaucoma, left eye, mild stage: Secondary | ICD-10-CM | POA: Diagnosis not present

## 2022-05-21 ENCOUNTER — Other Ambulatory Visit: Payer: Self-pay | Admitting: Family Medicine

## 2022-05-25 DIAGNOSIS — Z23 Encounter for immunization: Secondary | ICD-10-CM | POA: Diagnosis not present

## 2022-06-02 ENCOUNTER — Encounter: Payer: Self-pay | Admitting: Family Medicine

## 2022-06-02 ENCOUNTER — Ambulatory Visit (INDEPENDENT_AMBULATORY_CARE_PROVIDER_SITE_OTHER): Payer: Medicare Other | Admitting: Family Medicine

## 2022-06-02 VITALS — BP 142/86 | HR 64 | Temp 97.6°F | Ht 65.5 in | Wt 149.1 lb

## 2022-06-02 DIAGNOSIS — F411 Generalized anxiety disorder: Secondary | ICD-10-CM

## 2022-06-02 MED ORDER — BUSPIRONE HCL 15 MG PO TABS: 7.5000 mg | ORAL_TABLET | Freq: Two times a day (BID) | ORAL | 3 refills | Status: DC

## 2022-06-02 NOTE — Patient Instructions (Addendum)
Keep taking care of yourself   Continue exercise  Socialize when you can   I will put in the referral for counseling  If you don't get a call in the next week let us know  Try buspar 7.5 mg twice daily  If you tolerate it well there is an option to go up on the dose  Message me with how you are doing in the next few weeks   If you have any intolerable side effects let us know

## 2022-06-02 NOTE — Progress Notes (Signed)
Subjective:    Patient ID: Anita Buckley, female    DOB: 16-Sep-1950, 71 y.o.   MRN: [id number]  HPI Pt presents for f/u of GAD/ mood   Wt Readings from Last 3 Encounters:  06/02/22 149 lb 2 oz (67.6 kg)  04/28/22 150 lb 4 oz (68.2 kg)  04/13/22 150 lb 8 oz (68.3 kg)   24.44 kg/m   Last visit we px lexapro 10 mg  Discussed self care, counseling  Goal to use xanax less    She had to stop lexapro due to side effects -it actually caused side effects  Is a little better but still overwhelmed easily   She canceled her trip for May 2024 due to fear of the unknown  This was heart breaking    Is interested in trying buspar now  Got cleared from her eye doctor    Up and down a lot in the night for overactive bladder  Then worse anxiety - may go both ways  May be interested in urology referral   Called 4 people for counseling- could not get in  Is reading and meditating  Has friends she can talk to   Patient Active Problem List   Diagnosis Date Noted   Insomnia 07/30/2021   Thyroid nodule 10/12/2019   Welcome to Medicare preventive visit 03/01/2018   Tendinitis of hand 02/08/2018   Glaucoma (increased eye pressure) 07/04/2017   Encounter for annual routine gynecological examination 03/01/2017   Chronic lymphocytic leukemia (CLL), B-cell ([place]) 02/11/2016   Colon cancer screening 01/25/2015   Estrogen deficiency 01/25/2015   Lymphoma ([place]) 06/04/2014   Osteopenia 01/06/2013   HTN (hypertension), benign 06/28/2012   Other screening mammogram 01/05/2012   Gynecological examination 01/05/2012   Post-menopause 01/05/2012   Routine general medical examination at a health care facility 12/31/2011   PURE HYPERCHOLESTEROLEMIA 08/15/2008   GAD (generalized anxiety disorder) 12/08/2007   FIBROCYSTIC BREAST DISEASE 03/30/2007   Prediabetes 03/30/2007   Past Medical History:  Diagnosis Date   Anxiety    Cancer ([place])    B CELL LYMPHOMA   Chronic lymphocytic leukemia  (CLL), B-cell ([place]) 02/11/2016   Depression    History of anxiety    History of depression    History of fibrocystic disease of breast    History of hyperlipidemia    Hypertension    Situational stress    Past Surgical History:  Procedure Laterality Date   COLONOSCOPY  12/2005   polyps, tics   COLONOSCOPY WITH PROPOFOL N/A 05/11/2016   Procedure: COLONOSCOPY WITH PROPOFOL;  Surgeon: Manya Silvas, MD;  Location: [facility] ENDOSCOPY;  Service: Endoscopy;  Laterality: N/A;   THYROIDECTOMY Left 08/20/2015   Procedure: THYROIDECTOMY/ HEMITHYROIDECTOMY;  Surgeon: Beverly Gust, MD;  Location: [facility] ORS;  Service: ENT;  Laterality: Left;   TUBAL LIGATION     Social History   Tobacco Use   Smoking status: Never   Smokeless tobacco: Never  Vaping Use   Vaping Use: Never used  Substance Use Topics   Alcohol use: Yes    Alcohol/week: 5.0 - 10.0 standard drinks of alcohol    Types: 5 - 10 Glasses of wine per week    Comment: occ (wine with dinner)   Drug use: No   Family History  Problem Relation Age of Onset   Diabetes Father        late in life   Alcohol abuse Father    Hyperlipidemia Mother    Mental illness Mother  Depression Sister    Leukemia Brother    Bladder Cancer Brother    Schizophrenia Other        Grandfather   Breast cancer Neg Hx    Allergies  Allergen Reactions   Bee Venom     Anaphylaxis    Minocycline Nausea And Vomiting   Penicillins     REACTION: reaction as a child   Current Outpatient Medications on File Prior to Visit  Medication Sig Dispense Refill   ALPRAZolam (XANAX) 0.5 MG tablet Take 1 tablet (0.5 mg total) by mouth daily as needed for anxiety. 30 tablet 0   amLODipine (NORVASC) 5 MG tablet TAKE 1 TABLET (5 MG TOTAL) BY MOUTH DAILY. 90 tablet 3   Calcium Citrate-Vitamin D (CITRACAL + D PO) Take 1-2 capsules by mouth daily.      EPINEPHrine 0.3 mg/0.3 mL IJ SOAJ injection INJECT 0.3 ML INTO MUSCLE AS NEEDED 2 each 3   famotidine (PEPCID) 10  MG tablet Take 1 tablet (10 mg total) by mouth daily.     latanoprost (XALATAN) 0.005 % ophthalmic solution Place 1 drop into both eyes at bedtime.     rosuvastatin (CRESTOR) 10 MG tablet TAKE 1 TABLET BY MOUTH EVERY DAY 90 tablet 3   timolol (TIMOPTIC) 0.5 % ophthalmic solution 1 drop daily.     No current facility-administered medications on file prior to visit.     Review of Systems  Constitutional:  Positive for fatigue. Negative for activity change, appetite change, fever and unexpected weight change.  HENT:  Negative for congestion, ear pain, rhinorrhea, sinus pressure and sore throat.   Eyes:  Negative for pain, redness and visual disturbance.  Respiratory:  Negative for cough, shortness of breath and wheezing.   Cardiovascular:  Negative for chest pain and palpitations.  Gastrointestinal:  Negative for abdominal pain, blood in stool, constipation and diarrhea.  Endocrine: Negative for polydipsia and polyuria.  Genitourinary:  Negative for dysuria, frequency and urgency.  Musculoskeletal:  Negative for arthralgias, back pain and myalgias.  Skin:  Negative for pallor and rash.  Allergic/Immunologic: Negative for environmental allergies.  Neurological:  Negative for dizziness, syncope and headaches.  Hematological:  Negative for adenopathy. Does not bruise/bleed easily.  Psychiatric/Behavioral:  Positive for sleep disturbance. Negative for decreased concentration, dysphoric mood and suicidal ideas. The patient is nervous/anxious.        Objective:   Physical Exam Constitutional:      General: She is not in acute distress.    Appearance: Normal appearance. She is not ill-appearing.  HENT:     Head: Normocephalic and atraumatic.  Eyes:     Conjunctiva/sclera: Conjunctivae normal.     Pupils: Pupils are equal, round, and reactive to light.  Cardiovascular:     Rate and Rhythm: Normal rate.  Pulmonary:     Effort: Pulmonary effort is normal. No respiratory distress.      Breath sounds: Normal breath sounds.  Skin:    General: Skin is warm and dry.  Neurological:     Mental Status: She is alert.     Motor: No weakness.     Coordination: Coordination normal.  Psychiatric:        Attention and Perception: Attention normal.        Mood and Affect: Mood is anxious.        Speech: Speech normal.        Cognition and Memory: Cognition and memory normal.     Comments: Mildly anxious  Not tearful  Candidly discusses symptoms and stressors             Assessment & Plan:   Problem List Items Addressed This Visit       Other   GAD (generalized anxiety disorder) - Primary    Did not tolerate lexapro (caused panic attacks)  Canceled a trip in May she was worried about and a little relief there  Still jumpy and jittery  Counseling referral placed  Px buspar 7.5 mg bid (she has had in the past)  inst to hold it and alert [place] if side eff Discussed expectations of this medication including time to effectiveness and mechanism of action, also poss of side effects (early and late)- including mental fuzziness, weight or appetite change, nausea and poss of worse dep or anxiety (even suicidal thoughts)  Pt voiced understanding and will stop med and update if this occurs   Very good health/self care habits Enc socialization  Enc to continue meditation and exercise also      Relevant Medications   busPIRone (BUSPAR) 15 MG tablet   Other Relevant Orders   Ambulatory referral to Psychology

## 2022-06-02 NOTE — Assessment & Plan Note (Signed)
Did not tolerate lexapro (caused panic attacks)  Canceled a trip in May she was worried about and a little relief there  Still jumpy and jittery  Counseling referral placed  Px buspar 7.5 mg bid (she has had in the past)  inst to hold it and alert [place] if side eff Discussed expectations of this medication including time to effectiveness and mechanism of action, also poss of side effects (early and late)- including mental fuzziness, weight or appetite change, nausea and poss of worse dep or anxiety (even suicidal thoughts)  Pt voiced understanding and will stop med and update if this occurs   Very good health/self care habits Enc socialization  Enc to continue meditation and exercise also

## 2022-06-04 ENCOUNTER — Other Ambulatory Visit: Payer: Self-pay | Admitting: Family Medicine

## 2022-06-30 ENCOUNTER — Telehealth: Payer: Medicare Other | Admitting: Physician Assistant

## 2022-06-30 DIAGNOSIS — J019 Acute sinusitis, unspecified: Secondary | ICD-10-CM | POA: Diagnosis not present

## 2022-06-30 DIAGNOSIS — B9689 Other specified bacterial agents as the cause of diseases classified elsewhere: Secondary | ICD-10-CM | POA: Diagnosis not present

## 2022-06-30 MED ORDER — SULFAMETHOXAZOLE-TRIMETHOPRIM 800-160 MG PO TABS: 1.0000 | ORAL_TABLET | Freq: Two times a day (BID) | ORAL | 0 refills | Status: DC

## 2022-06-30 NOTE — Progress Notes (Signed)
E-Visit for Sinus Problems  We are sorry that you are not feeling well.  Here is how we plan to help!  Based on what you have shared with me it looks like you have sinusitis.  Sinusitis is inflammation and infection in the sinus cavities of the head.  Based on your presentation I believe you most likely have Acute Bacterial Sinusitis.  This is an infection caused by bacteria and is treated with antibiotics. I have prescribed Bactrim twice daily for 7 days.  You may use an oral decongestant such as Mucinex D or if you have glaucoma or high blood pressure use plain Mucinex. Saline nasal spray help and can safely be used as often as needed for congestion.  If you develop worsening sinus pain, fever or notice severe headache and vision changes, or if symptoms are not better after completion of antibiotic, please schedule an appointment with a health care provider.    Sinus infections are not as easily transmitted as other respiratory infection, however we still recommend that you avoid close contact with loved ones, especially the very young and elderly.  Remember to wash your hands thoroughly throughout the day as this is the number one way to prevent the spread of infection!  Home Care: Only take medications as instructed by your medical team. Complete the entire course of an antibiotic. Do not take these medications with alcohol. A steam or ultrasonic humidifier can help congestion.  You can place a towel over your head and breathe in the steam from hot water coming from a faucet. Avoid close contacts especially the very young and the elderly. Cover your mouth when you cough or sneeze. Always remember to wash your hands.  Get Help Right Away If: You develop worsening fever or sinus pain. You develop a severe head ache or visual changes. Your symptoms persist after you have completed your treatment plan.  Make sure you Understand these instructions. Will watch your condition. Will get help  right away if you are not doing well or get worse.  Thank you for choosing an e-visit.  Your e-visit answers were reviewed by a board certified advanced clinical practitioner to complete your personal care plan. Depending upon the condition, your plan could have included both over the counter or prescription medications.  Please review your pharmacy choice. Make sure the pharmacy is open so you can pick up prescription now. If there is a problem, you may contact your provider through [employer] and have the prescription routed to another pharmacy.  Your safety is important to [place]. If you have drug allergies check your prescription carefully.   For the next 24 hours you can use MyChart to ask questions about today's visit, request a non-urgent call back, or ask for a work or school excuse. You will get an email in the next two days asking about your experience. I hope that your e-visit has been valuable and will speed your recovery.

## 2022-06-30 NOTE — Progress Notes (Signed)
I have spent 5 minutes in review of e-visit questionnaire, review and updating patient chart, medical decision making and response to patient.   Zilda No Cody Mercedies Ganesh, PA-C    

## 2022-06-30 NOTE — Progress Notes (Signed)
I have spent 5 minutes in review of e-visit questionnaire, review and updating patient chart, medical decision making and response to patient.   Garrit Marrow Cody Cheyane Ayon, PA-C    

## 2022-07-01 ENCOUNTER — Ambulatory Visit (INDEPENDENT_AMBULATORY_CARE_PROVIDER_SITE_OTHER): Payer: Medicare Other | Admitting: Clinical

## 2022-07-01 DIAGNOSIS — F411 Generalized anxiety disorder: Secondary | ICD-10-CM

## 2022-07-01 NOTE — Progress Notes (Signed)
                Saliah Crisp, LCSW 
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
## 2022-07-20 ENCOUNTER — Telehealth: Payer: Self-pay | Admitting: Family Medicine

## 2022-07-20 DIAGNOSIS — E7800 Pure hypercholesterolemia, unspecified: Secondary | ICD-10-CM

## 2022-07-20 DIAGNOSIS — R7303 Prediabetes: Secondary | ICD-10-CM

## 2022-07-20 DIAGNOSIS — I10 Essential (primary) hypertension: Secondary | ICD-10-CM

## 2022-07-20 NOTE — Telephone Encounter (Signed)
-----   Message from Velna Hatchet, RT sent at 07/06/2022 12:03 PM EST ----- Regarding: Wed 12/6 lab Patient is scheduled for cpx, please order future labs.  Thanks, Anda Kraft

## 2022-07-22 ENCOUNTER — Other Ambulatory Visit: Payer: Medicare Other

## 2022-07-23 ENCOUNTER — Other Ambulatory Visit: Payer: Medicare Other

## 2022-07-23 ENCOUNTER — Other Ambulatory Visit (INDEPENDENT_AMBULATORY_CARE_PROVIDER_SITE_OTHER): Payer: Medicare Other

## 2022-07-23 DIAGNOSIS — R7303 Prediabetes: Secondary | ICD-10-CM

## 2022-07-23 DIAGNOSIS — I10 Essential (primary) hypertension: Secondary | ICD-10-CM

## 2022-07-23 DIAGNOSIS — E7800 Pure hypercholesterolemia, unspecified: Secondary | ICD-10-CM

## 2022-07-23 LAB — CBC WITH DIFFERENTIAL/PLATELET
Basophils Absolute: 0 10*3/uL (ref 0.0–0.1)
Basophils Relative: 0.3 % (ref 0.0–3.0)
Eosinophils Absolute: 0.2 10*3/uL (ref 0.0–0.7)
Eosinophils Relative: 1.5 % (ref 0.0–5.0)
HCT: 40 % (ref 36.0–46.0)
Hemoglobin: 13.3 g/dL (ref 12.0–15.0)
Lymphocytes Relative: 66.4 % — ABNORMAL HIGH (ref 12.0–46.0)
Lymphs Abs: 9.3 10*3/uL — ABNORMAL HIGH (ref 0.7–4.0)
MCHC: 33.2 g/dL (ref 30.0–36.0)
MCV: 88.5 fl (ref 78.0–100.0)
Monocytes Absolute: 0.7 10*3/uL (ref 0.1–1.0)
Monocytes Relative: 5.4 % (ref 3.0–12.0)
Neutro Abs: 3.7 10*3/uL (ref 1.4–7.7)
Neutrophils Relative %: 26.4 % — ABNORMAL LOW (ref 43.0–77.0)
Platelets: 165 10*3/uL (ref 150.0–400.0)
RBC: 4.52 Mil/uL (ref 3.87–5.11)
RDW: 13.2 % (ref 11.5–15.5)
WBC: 14 10*3/uL — ABNORMAL HIGH (ref 4.0–10.5)

## 2022-07-23 LAB — COMPREHENSIVE METABOLIC PANEL
ALT: 18 U/L (ref 0–35)
AST: 18 U/L (ref 0–37)
Albumin: 4.7 g/dL (ref 3.5–5.2)
Alkaline Phosphatase: 49 U/L (ref 39–117)
BUN: 21 mg/dL (ref 6–23)
CO2: 31 mEq/L (ref 19–32)
Calcium: 9.3 mg/dL (ref 8.4–10.5)
Chloride: 103 mEq/L (ref 96–112)
Creatinine, Ser: 0.74 mg/dL (ref 0.40–1.20)
GFR: 82.37 mL/min (ref >60.00)
Glucose, Bld: 103 mg/dL — ABNORMAL HIGH (ref 70–99)
Potassium: 4 mEq/L (ref 3.5–5.1)
Sodium: 139 mEq/L (ref 135–145)
Total Bilirubin: 0.6 mg/dL (ref 0.2–1.2)
Total Protein: 6.5 g/dL (ref 6.0–8.3)

## 2022-07-23 LAB — LIPID PANEL
Cholesterol: 143 mg/dL (ref 0–200)
HDL: 64.3 mg/dL (ref >39.00)
LDL Cholesterol: 63 mg/dL (ref 0–99)
NonHDL: 78.69
Total CHOL/HDL Ratio: 2
Triglycerides: 77 mg/dL (ref 0.0–149.0)
VLDL: 15.4 mg/dL (ref 0.0–40.0)

## 2022-07-23 LAB — HEMOGLOBIN A1C: Hgb A1c MFr Bld: 5.9 % (ref 4.6–6.5)

## 2022-07-23 LAB — TSH: TSH: 5.82 u[IU]/mL — ABNORMAL HIGH (ref 0.35–5.50)

## 2022-07-24 LAB — PATHOLOGIST SMEAR REVIEW

## 2022-07-29 ENCOUNTER — Ambulatory Visit: Payer: Medicare Other | Admitting: Clinical

## 2022-07-31 ENCOUNTER — Encounter: Payer: Self-pay | Admitting: Family Medicine

## 2022-07-31 ENCOUNTER — Ambulatory Visit (INDEPENDENT_AMBULATORY_CARE_PROVIDER_SITE_OTHER): Payer: Medicare Other | Admitting: Family Medicine

## 2022-07-31 VITALS — BP 128/76 | HR 60 | Temp 97.3°F | Ht 65.0 in | Wt 150.5 lb

## 2022-07-31 DIAGNOSIS — E7800 Pure hypercholesterolemia, unspecified: Secondary | ICD-10-CM

## 2022-07-31 DIAGNOSIS — I10 Essential (primary) hypertension: Secondary | ICD-10-CM | POA: Diagnosis not present

## 2022-07-31 DIAGNOSIS — R7303 Prediabetes: Secondary | ICD-10-CM | POA: Diagnosis not present

## 2022-07-31 DIAGNOSIS — E038 Other specified hypothyroidism: Secondary | ICD-10-CM | POA: Diagnosis not present

## 2022-07-31 DIAGNOSIS — E2839 Other primary ovarian failure: Secondary | ICD-10-CM | POA: Diagnosis not present

## 2022-07-31 DIAGNOSIS — F411 Generalized anxiety disorder: Secondary | ICD-10-CM | POA: Diagnosis not present

## 2022-07-31 DIAGNOSIS — C9110 Chronic lymphocytic leukemia of B-cell type not having achieved remission: Secondary | ICD-10-CM | POA: Diagnosis not present

## 2022-07-31 DIAGNOSIS — M8580 Other specified disorders of bone density and structure, unspecified site: Secondary | ICD-10-CM | POA: Diagnosis not present

## 2022-07-31 MED ORDER — AMLODIPINE BESYLATE 5 MG PO TABS: 5.0000 mg | ORAL_TABLET | Freq: Every day | ORAL | 3 refills | Status: DC

## 2022-07-31 MED ORDER — BUSPIRONE HCL 15 MG PO TABS: 7.5000 mg | ORAL_TABLET | Freq: Two times a day (BID) | ORAL | 3 refills | Status: DC

## 2022-07-31 NOTE — Assessment & Plan Note (Signed)
Drug holiday for alendronate for 2 y  Now due for dexa -ordered and pt will schedule  No falls or fx Enc her to continue ca with D and good exercise

## 2022-07-31 NOTE — Assessment & Plan Note (Signed)
Continues onc f/u  No current tx Priscille Loveless

## 2022-07-31 NOTE — Assessment & Plan Note (Signed)
Lab Results  Component Value Date   TSH 5.82 (H) 07/23/2022   Past h/o b9 nodules No change in exam Multi factorial fatigue  Will plan re check tsh in a mo with FT4

## 2022-07-31 NOTE — Assessment & Plan Note (Signed)
Stable Lab Results  Component Value Date   HGBA1C 5.9 07/23/2022   Is mindful about diet disc imp of low glycemic diet and wt loss to prevent DM2

## 2022-07-31 NOTE — Assessment & Plan Note (Signed)
bp in fair control at this time  BP Readings from Last 1 Encounters:  07/31/22 128/76   No changes needed Most recent labs reviewed  Disc lifstyle change with low sodium diet and exercise  Plan to continue amlodipine 5 mg daily

## 2022-07-31 NOTE — Progress Notes (Signed)
Subjective:    Patient ID: Anita Buckley, female    DOB: Sep 30, 1950, 71 y.o.   MRN: [id number]  HPI Pt presents for annual f/u of chronic health problems   Wt Readings from Last 3 Encounters:  07/31/22 150 lb 8 oz (68.3 kg)  06/02/22 149 lb 2 oz (67.6 kg)  04/28/22 150 lb 4 oz (68.2 kg)   25.04 kg/m  Doing better  Buspar helps  Weathering things better  Went to counseling once  / did not want to do virtual  Joined a gym  doing 2 times per week senior fitness  Working on [employer] History  Administered Date(s) Administered   Influenza Whole 05/08/2008, 06/17/2009   Influenza, High Dose Seasonal PF 05/28/2021, 05/29/2021, 05/25/2022   Influenza-Unspecified 06/01/2015, 06/03/2017, 07/04/2018, 06/21/2019, 05/20/2020   PFIZER Comirnaty(Gray Top)Covid-19 Tri-Sucrose Vaccine 11/26/2020, 05/16/2022   PFIZER(Purple Top)SARS-COV-2 Vaccination 08/22/2019, 09/12/2019, 03/31/2020   Pfizer Covid-19 Vaccine Bivalent Booster 36yr & up 05/09/2021, 12/20/2021   Pneumococcal Conjugate-13 03/01/2018   Pneumococcal Polysaccharide-23 04/28/2019   Td 08/26/2000, 08/18/2003   Tdap 01/09/2014   Zoster Recombinat (Shingrix) 06/21/2019, 10/23/2019   Zoster, Live 06/01/2007   Health Maintenance Due  Topic Date Due   Hepatitis C Screening  Never done   COVID-19 Vaccine (8 - 2023-24 season) 07/11/2022   Mammogram 04/2022  Self breast exam- no lumps   Colonoscopy 04/2016 just had one this year 04/23/2022 -per pt normal   Dexa 08/2020- osteopenia  Took alendronate in the past  (on drug holiday)  Falls: none  Fractures:none  Supplements : vitamin D (the ca is too big)  Exercise : going to the gym     HTN bp is stable today  No cp or palpitations or headaches or edema  No side effects to medicines  BP Readings from Last 3 Encounters:  07/31/22 128/76  06/02/22 (!) 142/86  04/28/22 114/82     Amlodipine 5 mg daily   CLL Lab Results  Component Value Date    WBC 14.0 (H) 07/23/2022   HGB 13.3 07/23/2022   HCT 40.0 07/23/2022   MCV 88.5 07/23/2022   PLT 165.0 07/23/2022     Prediabetes Lab Results  Component Value Date   HGBA1C 5.9 07/23/2022  Stable   Is pretty good about sugar  Some salty snack foods   Good exercise   Hyperlipidemia Lab Results  Component Value Date   CHOL 143 07/23/2022   CHOL 164 07/30/2021   CHOL 237 (H) 11/12/2020   Lab Results  Component Value Date   HDL 64.30 07/23/2022   HDL 65.70 07/30/2021   HDL 57.20 11/12/2020   Lab Results  Component Value Date   LDLCALC 63 07/23/2022   LDLCALC 78 07/30/2021   LDLCALC 157 (H) 11/12/2020   Lab Results  Component Value Date   TRIG 77.0 07/23/2022   TRIG 98.0 07/30/2021   TRIG 114.0 11/12/2020   Lab Results  Component Value Date   CHOLHDL 2 07/23/2022   CHOLHDL 2 07/30/2021   CHOLHDL 4 11/12/2020   Lab Results  Component Value Date   LDLDIRECT 126.4 06/28/2012   LDLDIRECT 143.2 08/15/2008   LDLDIRECT 141.2 05/10/2008  Crestor 10 mg daily    Thyroid  H/o thyroid nodules in past and saw Dr SGabriel Carina Lab Results  Component Value Date   TSH 5.82 (H) 07/23/2022    She has felt more tired lately  Hard to tell if this if from her CLL  Patient  Active Problem List   Diagnosis Date Noted   Elevated TSH 07/31/2022   Insomnia 07/30/2021   Thyroid nodule 10/12/2019   Welcome to Medicare preventive visit 03/01/2018   Tendinitis of hand 02/08/2018   Glaucoma (increased eye pressure) 07/04/2017   Encounter for annual routine gynecological examination 03/01/2017   Chronic lymphocytic leukemia (CLL), B-cell ([place]) 02/11/2016   Colon cancer screening 01/25/2015   Estrogen deficiency 01/25/2015   Lymphoma ([place]) 06/04/2014   Osteopenia 01/06/2013   HTN (hypertension), benign 06/28/2012   Other screening mammogram 01/05/2012   Gynecological examination 01/05/2012   Post-menopause 01/05/2012   Routine general medical examination at a health care  facility 12/31/2011   PURE HYPERCHOLESTEROLEMIA 08/15/2008   GAD (generalized anxiety disorder) 12/08/2007   FIBROCYSTIC BREAST DISEASE 03/30/2007   Prediabetes 03/30/2007   Past Medical History:  Diagnosis Date   Anxiety    Cancer ([place])    B CELL LYMPHOMA   Chronic lymphocytic leukemia (CLL), B-cell ([place]) 02/11/2016   Depression    History of anxiety    History of depression    History of fibrocystic disease of breast    History of hyperlipidemia    Hypertension    Situational stress    Past Surgical History:  Procedure Laterality Date   COLONOSCOPY  12/2005   polyps, tics   COLONOSCOPY WITH PROPOFOL N/A 05/11/2016   Procedure: COLONOSCOPY WITH PROPOFOL;  Surgeon: Manya Silvas, MD;  [facility];  Service: Endoscopy;  Laterality: N/A;   THYROIDECTOMY Left 08/20/2015   Procedure: THYROIDECTOMY/ HEMITHYROIDECTOMY;  Surgeon: Beverly Gust, MD;  Location: [facility] OR[facility];  Service: ENT;  Laterality: Left;   TUBAL LIGATION     Social History   Tobacco Use   Smoking status: Never   Smokeless tobacco: Never  Vaping Use   Vaping Use: Never used  Substance Use Topics   Alcohol use: Yes    Alcohol/week: 5.0 - 10.0 standard drinks of alcohol    Types: 5 - 10 Glasses of wine per week    Comment: occ (wine with dinner)   Drug use: No   Family History  Problem Relation Age of Onset   Diabetes Father        late in life   Alcohol abuse Father    Hyperlipidemia Mother    Mental illness Mother    Depression Sister    Leukemia Brother    Bladder Cancer Brother    Schizophrenia Other        Grandfather   Breast cancer Neg Hx    Allergies  Allergen Reactions   Bee Venom     Anaphylaxis    Minocycline Nausea And Vomiting   Penicillins     REACTION: reaction as a child   Current Outpatient Medications on File Prior to Visit  Medication Sig Dispense Refill   ALPRAZolam (XANAX) 0.5 MG tablet Take 1 tablet (0.5 mg total) by mouth daily as needed for anxiety. 30  tablet 0   Calcium Citrate-Vitamin D (CITRACAL + D PO) Take 1-2 capsules by mouth daily.      EPINEPHrine 0.3 mg/0.3 mL IJ SOAJ injection INJECT 0.3 ML INTO MUSCLE AS NEEDED 2 each 3   famotidine (PEPCID) 10 MG tablet Take 1 tablet (10 mg total) by mouth daily.     latanoprost (XALATAN) 0.005 % ophthalmic solution Place 1 drop into both eyes at bedtime.     rosuvastatin (CRESTOR) 10 MG tablet TAKE 1 TABLET BY MOUTH EVERY DAY 90 tablet 3   timolol (  TIMOPTIC) 0.5 % ophthalmic solution 1 drop daily.     No current facility-administered medications on file prior to visit.      Review of Systems  Constitutional:  Positive for fatigue. Negative for activity change, appetite change, fever and unexpected weight change.  HENT:  Negative for congestion, ear pain, rhinorrhea, sinus pressure and sore throat.   Eyes:  Negative for pain, redness and visual disturbance.  Respiratory:  Negative for cough, shortness of breath and wheezing.   Cardiovascular:  Negative for chest pain and palpitations.  Gastrointestinal:  Negative for abdominal pain, blood in stool, constipation and diarrhea.  Endocrine: Negative for polydipsia and polyuria.  Genitourinary:  Negative for dysuria, frequency and urgency.  Musculoskeletal:  Negative for arthralgias, back pain and myalgias.  Skin:  Negative for pallor and rash.  Allergic/Immunologic: Negative for environmental allergies.  Neurological:  Negative for dizziness, syncope and headaches.  Hematological:  Negative for adenopathy. Does not bruise/bleed easily.  Psychiatric/Behavioral:  Negative for decreased concentration and dysphoric mood. The patient is nervous/anxious.        Objective:   Physical Exam Constitutional:      General: She is not in acute distress.    Appearance: Normal appearance. She is well-developed. She is not ill-appearing or diaphoretic.  HENT:     Head: Normocephalic and atraumatic.     Right Ear: Tympanic membrane, ear canal and  external ear normal.     Left Ear: Tympanic membrane, ear canal and external ear normal.     Nose: Nose normal. No congestion.     Mouth/Throat:     Mouth: Mucous membranes are moist.     Pharynx: Oropharynx is clear. No posterior oropharyngeal erythema.  Eyes:     General: No scleral icterus.    Extraocular Movements: Extraocular movements intact.     Conjunctiva/sclera: Conjunctivae normal.     Pupils: Pupils are equal, round, and reactive to light.  Neck:     Thyroid: No thyromegaly.     Vascular: No carotid bruit or JVD.     Comments: No change in thyroid exam Cardiovascular:     Rate and Rhythm: Normal rate and regular rhythm.     Pulses: Normal pulses.     Heart sounds: Normal heart sounds.     No gallop.  Pulmonary:     Effort: Pulmonary effort is normal. No respiratory distress.     Breath sounds: Normal breath sounds. No wheezing.     Comments: Good air exch Chest:     Chest wall: No tenderness.  Abdominal:     General: Bowel sounds are normal. There is no distension or abdominal bruit.     Palpations: Abdomen is soft. There is no mass.     Tenderness: There is no abdominal tenderness.     Hernia: No hernia is present.  Genitourinary:    Comments: Breast exam: No mass, nodules, thickening, tenderness, bulging, retraction, inflamation, nipple discharge or skin changes noted.  No axillary or clavicular LA.     Musculoskeletal:        General: No tenderness. Normal range of motion.     Cervical back: Normal range of motion and neck supple. No rigidity. No muscular tenderness.     Right lower leg: No edema.     Left lower leg: No edema.     Comments: No kyphosis   Lymphadenopathy:     Cervical: Cervical adenopathy present.  Skin:    General: Skin is warm and dry.  Coloration: Skin is not pale.     Findings: No erythema or rash.     Comments: Solar lentigines diffusely   Neurological:     Mental Status: She is alert. Mental status is at baseline.     Cranial  Nerves: No cranial nerve deficit.     Motor: No abnormal muscle tone.     Coordination: Coordination normal.     Gait: Gait normal.     Deep Tendon Reflexes: Reflexes are normal and symmetric. Reflexes normal.  Psychiatric:        Mood and Affect: Mood normal.        Cognition and Memory: Cognition and memory normal.           Assessment & Plan:   Problem List Items Addressed This Visit       Cardiovascular and Mediastinum   HTN (hypertension), benign - Primary    bp in fair control at this time  BP Readings from Last 1 Encounters:  07/31/22 128/76  No changes needed Most recent labs reviewed  Disc lifstyle change with low sodium diet and exercise  Plan to continue amlodipine 5 mg daily       Relevant Medications   amLODipine (NORVASC) 5 MG tablet     Endocrine   Subclinical hypothyroidism    Lab Results  Component Value Date   TSH 5.82 (H) 07/23/2022  Past h/o b9 nodules No change in exam Multi factorial fatigue  Will plan re check tsh in a mo with FT4       Relevant Orders   T4, free   TSH     Musculoskeletal and Integument   Osteopenia    Drug holiday for alendronate for 2 y  Now due for dexa -ordered and pt will schedule  No falls or fx Enc her to continue ca with D and good exercise         Other   Chronic lymphocytic leukemia (CLL), B-cell ([place])    Continues onc f/u  No current tx /stable       Estrogen deficiency   Relevant Orders   DG Bone Density   GAD (generalized anxiety disorder)    Significant imp with buspar 7.5 mg bid  Had a counseling appt and unsure if she will continue  Self care is improved and exercising       Relevant Medications   busPIRone (BUSPAR) 15 MG tablet   Prediabetes    Stable Lab Results  Component Value Date   HGBA1C 5.9 07/23/2022  Is mindful about diet disc imp of low glycemic diet and wt loss to prevent DM2       Pure hypercholesterolemia    Disc goals for lipids and reasons to control them Rev  last labs with pt Rev low sat fat diet in detail Very well controlled with crestor 10 mg daily  LDO of 63      Relevant Medications   amLODipine (NORVASC) 5 MG tablet

## 2022-07-31 NOTE — Patient Instructions (Addendum)
Stop at check out so I can send for your colonsocopy report   Please call [facility] to schedule your dexa after January 19   Keep exercising  Strength training is much more important than we thought in the past   The thyroid number warrants follow up  Schedule non fasting labs for a month

## 2022-07-31 NOTE — Assessment & Plan Note (Signed)
Significant imp with buspar 7.5 mg bid  Had a counseling appt and unsure if she will continue  Self care is improved and exercising

## 2022-07-31 NOTE — Assessment & Plan Note (Signed)
Disc goals for lipids and reasons to control them Rev last labs with pt Rev low sat fat diet in detail Very well controlled with crestor 10 mg daily  LDO of 63

## 2022-08-20 ENCOUNTER — Encounter: Payer: Self-pay | Admitting: Family Medicine

## 2022-08-31 ENCOUNTER — Other Ambulatory Visit (INDEPENDENT_AMBULATORY_CARE_PROVIDER_SITE_OTHER): Payer: Medicare Other

## 2022-08-31 DIAGNOSIS — E038 Other specified hypothyroidism: Secondary | ICD-10-CM | POA: Diagnosis not present

## 2022-08-31 LAB — TSH: TSH: 5.07 u[IU]/mL (ref 0.35–5.50)

## 2022-08-31 LAB — T4, FREE: Free T4: 0.82 ng/dL (ref 0.60–1.60)

## 2022-10-14 ENCOUNTER — Other Ambulatory Visit: Payer: Medicare Other

## 2022-10-15 ENCOUNTER — Inpatient Hospital Stay: Payer: Medicare Other | Attending: Oncology

## 2022-10-15 DIAGNOSIS — Z8572 Personal history of non-Hodgkin lymphomas: Secondary | ICD-10-CM | POA: Insufficient documentation

## 2022-10-15 DIAGNOSIS — C8300 Small cell B-cell lymphoma, unspecified site: Secondary | ICD-10-CM

## 2022-10-15 LAB — CBC WITH DIFFERENTIAL/PLATELET
Abs Immature Granulocytes: 0.03 10*3/uL (ref 0.00–0.07)
Basophils Absolute: 0 10*3/uL (ref 0.0–0.1)
Basophils Relative: 0 %
Eosinophils Absolute: 0.3 10*3/uL (ref 0.0–0.5)
Eosinophils Relative: 2 %
HCT: 40.5 % (ref 36.0–46.0)
Hemoglobin: 13 g/dL (ref 12.0–15.0)
Immature Granulocytes: 0 %
Lymphocytes Relative: 60 %
Lymphs Abs: 8.5 10*3/uL — ABNORMAL HIGH (ref 0.7–4.0)
MCH: 29.2 pg (ref 26.0–34.0)
MCHC: 32.1 g/dL (ref 30.0–36.0)
MCV: 91 fL (ref 80.0–100.0)
Monocytes Absolute: 1.3 10*3/uL — ABNORMAL HIGH (ref 0.1–1.0)
Monocytes Relative: 9 %
Neutro Abs: 4.1 10*3/uL (ref 1.7–7.7)
Neutrophils Relative %: 29 %
Platelets: 160 10*3/uL (ref 150–400)
RBC: 4.45 MIL/uL (ref 3.87–5.11)
RDW: 12.7 % (ref 11.5–15.5)
WBC: 14.3 10*3/uL — ABNORMAL HIGH (ref 4.0–10.5)
nRBC: 0 % (ref 0.0–0.2)

## 2022-10-21 DIAGNOSIS — Z23 Encounter for immunization: Secondary | ICD-10-CM | POA: Diagnosis not present

## 2022-11-03 ENCOUNTER — Encounter: Payer: Self-pay | Admitting: Family Medicine

## 2022-11-03 MED ORDER — ALPRAZOLAM 0.5 MG PO TABS: 0.5000 mg | ORAL_TABLET | Freq: Every day | ORAL | 0 refills | Status: DC | PRN

## 2022-11-03 NOTE — Telephone Encounter (Signed)
Name of Medication: Xanax Name of Pharmacy: [facility] or Written Date and Quantity: 04/28/22 #30 tabs/ 0 refills Last Office Visit and Type: CPE 07/31/22 Next Office Visit and Type: non scheduled

## 2022-11-05 ENCOUNTER — Other Ambulatory Visit: Payer: Self-pay | Admitting: Family Medicine

## 2022-11-19 DIAGNOSIS — D3131 Benign neoplasm of right choroid: Secondary | ICD-10-CM | POA: Diagnosis not present

## 2022-11-19 DIAGNOSIS — H40001 Preglaucoma, unspecified, right eye: Secondary | ICD-10-CM | POA: Diagnosis not present

## 2022-11-19 DIAGNOSIS — H2513 Age-related nuclear cataract, bilateral: Secondary | ICD-10-CM | POA: Diagnosis not present

## 2022-11-19 DIAGNOSIS — H401121 Primary open-angle glaucoma, left eye, mild stage: Secondary | ICD-10-CM | POA: Diagnosis not present

## 2022-12-08 ENCOUNTER — Ambulatory Visit
Admission: RE | Admit: 2022-12-08 | Discharge: 2022-12-08 | Disposition: A | Discharge status: Home / Self Care | Payer: Medicare Other | Source: Ambulatory Visit | Attending: Family Medicine | Admitting: Family Medicine

## 2022-12-08 DIAGNOSIS — M8589 Other specified disorders of bone density and structure, multiple sites: Secondary | ICD-10-CM | POA: Diagnosis not present

## 2022-12-08 DIAGNOSIS — Z780 Asymptomatic menopausal state: Secondary | ICD-10-CM | POA: Diagnosis not present

## 2022-12-08 DIAGNOSIS — E2839 Other primary ovarian failure: Secondary | ICD-10-CM | POA: Insufficient documentation

## 2023-01-01 ENCOUNTER — Encounter: Payer: Self-pay | Admitting: Family Medicine

## 2023-01-01 DIAGNOSIS — G8929 Other chronic pain: Secondary | ICD-10-CM

## 2023-01-01 DIAGNOSIS — M25561 Pain in right knee: Secondary | ICD-10-CM | POA: Insufficient documentation

## 2023-01-01 DIAGNOSIS — M7061 Trochanteric bursitis, right hip: Secondary | ICD-10-CM

## 2023-01-01 DIAGNOSIS — M2141 Flat foot [pes planus] (acquired), right foot: Secondary | ICD-10-CM | POA: Insufficient documentation

## 2023-01-10 ENCOUNTER — Encounter: Payer: Self-pay | Admitting: Family Medicine

## 2023-01-12 ENCOUNTER — Telehealth: Payer: Self-pay

## 2023-01-12 NOTE — Telephone Encounter (Signed)
Added from my chart message from patient.   I found a tick behind my ear 5/23, and one between my toes 5/26, after a recent trip to [place]. , Both attached, neither swollen or engorged.  Should I take preventative  dose of doxycycline ??  [facility] [place], [place].  Called patient states no rash. One behind ear is a little itchy. Started putting some OTC cream that is helping with the itch. No fever, headache that is out of her normal. Advised if any changes to the area or any new symptoms to give [place] a call back. But for now she is going to watch the are. I have reviewed and had repeat back to me all the symptoms she would need to let us know about right away. Skin changes or bullseye rash. Any redness. Swelling or heat in area. Any nausea, vomiting, headache or changes in vision.

## 2023-01-12 NOTE — Telephone Encounter (Signed)
Aware and agree with advisement  Thanks

## 2023-01-12 NOTE — Telephone Encounter (Signed)
Called patient reviewed all symptoms and sent in phone note to Dr. Milinda Antis. Sending to her pool to hold if she has any more information she would like for [place] to reach out to patient with.

## 2023-01-20 DIAGNOSIS — M25561 Pain in right knee: Secondary | ICD-10-CM | POA: Diagnosis not present

## 2023-01-20 DIAGNOSIS — M25552 Pain in left hip: Secondary | ICD-10-CM | POA: Diagnosis not present

## 2023-01-20 DIAGNOSIS — M25551 Pain in right hip: Secondary | ICD-10-CM | POA: Diagnosis not present

## 2023-01-20 DIAGNOSIS — M25562 Pain in left knee: Secondary | ICD-10-CM | POA: Diagnosis not present

## 2023-01-26 DIAGNOSIS — M25561 Pain in right knee: Secondary | ICD-10-CM | POA: Diagnosis not present

## 2023-01-26 DIAGNOSIS — M25552 Pain in left hip: Secondary | ICD-10-CM | POA: Diagnosis not present

## 2023-01-26 DIAGNOSIS — M25551 Pain in right hip: Secondary | ICD-10-CM | POA: Diagnosis not present

## 2023-01-26 DIAGNOSIS — M25562 Pain in left knee: Secondary | ICD-10-CM | POA: Diagnosis not present

## 2023-02-01 DIAGNOSIS — M25561 Pain in right knee: Secondary | ICD-10-CM | POA: Diagnosis not present

## 2023-02-01 DIAGNOSIS — M25562 Pain in left knee: Secondary | ICD-10-CM | POA: Diagnosis not present

## 2023-02-01 DIAGNOSIS — M25552 Pain in left hip: Secondary | ICD-10-CM | POA: Diagnosis not present

## 2023-02-01 DIAGNOSIS — M25551 Pain in right hip: Secondary | ICD-10-CM | POA: Diagnosis not present

## 2023-02-08 DIAGNOSIS — M25561 Pain in right knee: Secondary | ICD-10-CM | POA: Diagnosis not present

## 2023-02-08 DIAGNOSIS — M25562 Pain in left knee: Secondary | ICD-10-CM | POA: Diagnosis not present

## 2023-02-08 DIAGNOSIS — M25552 Pain in left hip: Secondary | ICD-10-CM | POA: Diagnosis not present

## 2023-02-08 DIAGNOSIS — M25551 Pain in right hip: Secondary | ICD-10-CM | POA: Diagnosis not present

## 2023-02-22 DIAGNOSIS — M25562 Pain in left knee: Secondary | ICD-10-CM | POA: Diagnosis not present

## 2023-02-22 DIAGNOSIS — M25551 Pain in right hip: Secondary | ICD-10-CM | POA: Diagnosis not present

## 2023-02-22 DIAGNOSIS — M25552 Pain in left hip: Secondary | ICD-10-CM | POA: Diagnosis not present

## 2023-02-22 DIAGNOSIS — M25561 Pain in right knee: Secondary | ICD-10-CM | POA: Diagnosis not present

## 2023-03-17 ENCOUNTER — Other Ambulatory Visit: Payer: Self-pay | Admitting: Family Medicine

## 2023-03-17 NOTE — Telephone Encounter (Signed)
Refill request for  ALPRAZOLAM 0.5 MG TAB   LOV - 07/31/22 Next OV - not scheduled Last refill - 11/03/22 #30/0

## 2023-03-18 ENCOUNTER — Ambulatory Visit (INDEPENDENT_AMBULATORY_CARE_PROVIDER_SITE_OTHER): Payer: Medicare Other

## 2023-03-18 VITALS — Ht 65.0 in | Wt 155.0 lb

## 2023-03-18 DIAGNOSIS — Z0000 Encounter for general adult medical examination without abnormal findings: Secondary | ICD-10-CM

## 2023-03-18 DIAGNOSIS — Z1231 Encounter for screening mammogram for malignant neoplasm of breast: Secondary | ICD-10-CM

## 2023-03-18 MED ORDER — ALPRAZOLAM 0.5 MG PO TABS: 0.5000 mg | ORAL_TABLET | Freq: Every day | ORAL | 0 refills | Status: DC | PRN

## 2023-03-18 NOTE — Progress Notes (Signed)
Subjective:   Anita Buckley is a 72 y.o. female who presents for Medicare Annual (Subsequent) preventive examination.  Visit Complete: Virtual  I connected with  Anita Buckley on 03/18/23 by a audio enabled telemedicine application and verified that I am speaking with the correct person using two identifiers.  Patient Location: Home  Provider Location: Home Office  I discussed the limitations of evaluation and management by telemedicine. The patient expressed understanding and agreed to proceed.  Patient Medicare AWV questionnaire was completed by the patient on 03/15/23; I have confirmed that all information answered by patient is correct and no changes since this date. Vital Signs: Unable to obtain new vitals due to this being a telehealth visit.   Review of Systems      Cardiac Risk Factors include: advanced age (>24men, >64 women);hypertension     Objective:    Today's Vitals   03/18/23 1004  Weight: 155 lb (70.3 kg)  Height: 5\' 5"  (1.651 m)   Body mass index is 25.79 kg/m.     03/18/2023   10:13 AM 04/13/2022   11:09 AM 02/10/2022   11:05 AM 04/08/2021   10:02 AM 01/31/2021    2:00 PM 04/08/2020    1:51 PM 01/09/2020   11:21 AM  Advanced Directives  Does Patient Have a Medical Advance Directive? Yes No Yes Yes Yes No Yes  Type of Estate agent of [place];Living will  Healthcare Power of [employer] of [place];Living will Healthcare Power of [place];Living will  Healthcare Power of [place];Living will  Copy of Healthcare Power of Attorney in Chart? No - copy requested  No - copy requested  No - copy requested  No - copy requested  Would patient like information on creating a medical advance directive?  No - Patient declined         Current Medications (verified) Outpatient Encounter Medications as of 03/18/2023  Medication Sig   ALPRAZolam (XANAX) 0.5 MG tablet TAKE 1 TABLET BY MOUTH DAILY AS NEEDED FOR ANXIETY    amLODipine (NORVASC) 5 MG tablet Take 1 tablet (5 mg total) by mouth daily.   busPIRone (BUSPAR) 15 MG tablet Take 0.5 tablets (7.5 mg total) by mouth 2 (two) times daily.   latanoprost (XALATAN) 0.005 % ophthalmic solution Place 1 drop into both eyes at bedtime.   rosuvastatin (CRESTOR) 10 MG tablet TAKE ONE TABLET BY MOUTH EVERY DAY   timolol (TIMOPTIC) 0.5 % ophthalmic solution 1 drop daily.   Calcium Citrate-Vitamin D (CITRACAL + D PO) Take 1-2 capsules by mouth daily.    EPINEPHrine 0.3 mg/0.3 mL IJ SOAJ injection INJECT 0.3 ML INTO MUSCLE AS NEEDED (Patient not taking: Reported on 03/18/2023)   famotidine (PEPCID) 10 MG tablet Take 1 tablet (10 mg total) by mouth daily. (Patient not taking: Reported on 03/18/2023)   No facility-administered encounter medications on file as of 03/18/2023.    Allergies (verified) Bee venom, Minocycline, and Penicillins   History: Past Medical History:  Diagnosis Date   Anxiety    Cancer (HCC)    B CELL LYMPHOMA   Chronic lymphocytic leukemia (CLL), B-cell (HCC) 02/11/2016   Depression    History of anxiety    History of depression    History of fibrocystic disease of breast    History of hyperlipidemia    Hypertension    Situational stress    Past Surgical History:  Procedure Laterality Date   COLONOSCOPY  12/2005   polyps, tics   COLONOSCOPY WITH  PROPOFOL N/A 05/11/2016   Procedure: COLONOSCOPY WITH PROPOFOL;  Surgeon: Scot Jun, MD;  Location: [facility] ENDOSCOPY;  Service: Endoscopy;  Laterality: N/A;   THYROIDECTOMY Left 08/20/2015   Procedure: THYROIDECTOMY/ HEMITHYROIDECTOMY;  Surgeon: Linus Salmons, MD;  Location: [facility] ORS;  Service: ENT;  Laterality: Left;   TUBAL LIGATION     Family History  Problem Relation Age of Onset   Diabetes Father        late in life   Alcohol abuse Father    Hyperlipidemia Mother    Mental illness Mother    Depression Sister    Leukemia Brother    Bladder Cancer Brother    Schizophrenia Other         Grandfather   Breast cancer Neg Hx    Social History   Socioeconomic History   Marital status: Married    Spouse name: Not on file   Number of children: Not on file   Years of education: Not on file   Highest education level: Not on file  Occupational History   Not on file  Tobacco Use   Smoking status: Never   Smokeless tobacco: Never  Vaping Use   Vaping status: Never Used  Substance and Sexual Activity   Alcohol use: Yes    Alcohol/week: 5.0 - 10.0 standard drinks of alcohol    Types: 5 - 10 Glasses of wine per week    Comment: occ (wine with dinner)   Drug use: No   Sexual activity: Not on file  Other Topics Concern   Not on file  Social History Narrative   Lives at home in private residence with husband   Social Determinants of Health   Financial Resource Strain: Low Risk  (03/18/2023)   Overall Financial Resource Strain (CARDIA)    Difficulty of Paying Living Expenses: Not hard at all  Food Insecurity: No Food Insecurity (03/18/2023)   Hunger Vital Sign    Worried About Running Out of Food in the Last Year: Never true    Ran Out of Food in the Last Year: Never true  Transportation Needs: No Transportation Needs (03/18/2023)   PRAPARE - Administrator, Civil Service (Medical): No    Lack of Transportation (Non-Medical): No  Physical Activity: Sufficiently Active (03/18/2023)   Exercise Vital Sign    Days of Exercise per Week: 4 days    Minutes of Exercise per Session: 40 min  Stress: No Stress Concern Present (03/18/2023)   [employer] of Occupational Health - Occupational Stress Questionnaire    Feeling of Stress : Not at all  Social Connections: Moderately Isolated (03/18/2023)   Social Connection and Isolation Panel [NHANES]    Frequency of Communication with Friends and Family: More than three times a week    Frequency of Social Gatherings with Friends and Family: Once a week    Attends Religious Services: Never    Database administrator or  Organizations: No    Attends Engineer, structural: Never    Marital Status: Married    Tobacco Counseling Counseling given: Not Answered   Clinical Intake:  Pre-visit preparation completed: Yes  Pain : No/denies pain     BMI - recorded: 25.75 Nutritional Status: BMI 25 -29 Overweight Nutritional Risks: None Diabetes: No  How often do you need to have someone help you when you read instructions, pamphlets, or other written materials from your doctor or pharmacy?: 1 - Never  Interpreter Needed?: No  Information entered by ::  C.Huey Scalia LPN   Activities of Daily Living    03/15/2023   11:04 AM  In your present state of health, do you have any difficulty performing the following activities:  Hearing? 0  Vision? 0  Difficulty concentrating or making decisions? 0  Walking or climbing stairs? 0  Dressing or bathing? 0  Doing errands, shopping? 0  Preparing Food and eating ? N  Using the Toilet? N  In the past six months, have you accidently leaked urine? N  Do you have problems with loss of bowel control? N  Managing your Medications? N  Managing your Finances? N  Housekeeping or managing your Housekeeping? N    Patient Care Team: Tower, Audrie Gallus, MD as PCP - General  Indicate any recent Medical Services you may have received from other than [facility] providers in the past year (date may be approximate).     Assessment:   This is a routine wellness examination for Sujeily.  Hearing/Vision screen Hearing Screening - Comments:: Denies hearing difficulties   Vision Screening - Comments:: Glasses - [facility] - Has Glaucoma and is UTD on eye exams  Dietary issues and exercise activities discussed:     Goals Addressed             This Visit's Progress    Patient Stated       Continue knee exercises to build strength.        Depression Screen    03/18/2023   10:12 AM 04/28/2022    3:29 PM 02/10/2022   11:13 AM 01/31/2021    2:02 PM 01/09/2020    11:22 AM 03/01/2018    2:33 PM 03/01/2017   11:21 AM  PHQ 2/9 Scores  PHQ - 2 Score 0 0 0 0 0 0 2  PHQ- 9 Score  8  0 0  8    Fall Risk    03/15/2023   11:04 AM 02/10/2022   11:05 AM 01/31/2021    2:01 PM 01/09/2020   11:22 AM 03/01/2018    2:33 PM  Fall Risk   Falls in the past year? 0 0 0 0 No  Number falls in past yr: 0 0 0 0   Injury with Fall? 0 0 0 0   Risk for fall due to : No Fall Risks  Medication side effect Medication side effect   Follow up Falls prevention discussed;Falls evaluation completed Falls evaluation completed;Education provided;Falls prevention discussed Falls evaluation completed;Falls prevention discussed Falls evaluation completed;Falls prevention discussed     MEDICARE RISK AT HOME:   TIMED UP AND GO:  Was the test performed?  No    Cognitive Function:    01/31/2021    2:04 PM 01/09/2020   11:25 AM  MMSE - Mini Mental State Exam  Not completed: Refused   Orientation to time  5  Orientation to Place  5  Registration  3  Attention/ Calculation  5  Recall  3  Language- repeat  1        03/18/2023   10:16 AM 02/10/2022   11:06 AM  6CIT Screen  What Year? 0 points 0 points  What month? 0 points 0 points  What time? 0 points 0 points  Count back from 20 0 points 0 points  Months in reverse 0 points 0 points  Repeat phrase 2 points 0 points  Total Score 2 points 0 points    Immunizations Immunization History  Administered Date(s) Administered   Influenza Whole 05/08/2008, 06/17/2009  Influenza, High Dose Seasonal PF 05/28/2021, 05/29/2021, 05/25/2022   Influenza-Unspecified 06/01/2015, 06/03/2017, 07/04/2018, 06/21/2019, 05/20/2020   PFIZER Comirnaty(Gray Top)Covid-19 Tri-Sucrose Vaccine 11/26/2020, 05/16/2022   PFIZER(Purple Top)SARS-COV-2 Vaccination 08/22/2019, 09/12/2019, 03/31/2020   Pfizer Covid-19 Vaccine Bivalent Booster 39yrs & up 05/09/2021, 12/20/2021   Pneumococcal Conjugate-13 03/01/2018   Pneumococcal Polysaccharide-23  04/28/2019   Respiratory Syncytial Virus Vaccine,Recomb Aduvanted(Arexvy) 07/31/2022   Td 08/26/2000, 08/18/2003   Tdap 01/09/2014   Zoster Recombinant(Shingrix) 06/21/2019, 10/23/2019   Zoster, Live 06/01/2007    TDAP status: Up to date  Flu Vaccine status: Due, Education has been provided regarding the importance of this vaccine. Advised may receive this vaccine at local pharmacy or Health Dept. Aware to provide a copy of the vaccination record if obtained from local pharmacy or Health Dept. Verbalized acceptance and understanding.  Pneumococcal vaccine status: Up to date  Covid-19 vaccine status: Information provided on how to obtain vaccines.   Qualifies for Shingles Vaccine? Yes   Zostavax completed Yes   Shingrix Completed?: Yes  Screening Tests Health Maintenance  Topic Date Due   Hepatitis C Screening  Never done   COVID-19 Vaccine (8 - 2023-24 season) 07/11/2022   INFLUENZA VACCINE  03/18/2023   DTaP/Tdap/Td (4 - Td or Tdap) 01/10/2024   Medicare Annual Wellness (AWV)  03/17/2024   MAMMOGRAM  04/28/2024   Colonoscopy  04/23/2032   Pneumonia Vaccine 1+ Years old  Completed   DEXA SCAN  Completed   Zoster Vaccines- Shingrix  Completed   HPV VACCINES  Aged Out    Health Maintenance  Health Maintenance Due  Topic Date Due   Hepatitis C Screening  Never done   COVID-19 Vaccine (8 - 2023-24 season) 07/11/2022   INFLUENZA VACCINE  03/18/2023    Colorectal cancer screening: Type of screening: Colonoscopy. Completed 04/23/22. Repeat every 10 years  Mammogram status: Ordered 03/18/23. Pt provided with contact info and advised to call to schedule appt.   Bone Density status: Completed 12/08/22. Results reflect: Bone density results: OSTEOPENIA. Repeat every 2 years.  Lung Cancer Screening: (Low Dose CT Chest recommended if Age 81-80 years, 20 pack-year currently smoking OR have quit w/in 15years.) does not qualify.   Lung Cancer Screening Referral: no  Additional  Screening:  Hepatitis C Screening: does qualify; Completed DUE  Vision Screening: Recommended annual ophthalmology exams for early detection of glaucoma and other disorders of the eye. Is the patient up to date with their annual eye exam?  Yes  Who is the provider or what is the name of the office in which the patient attends annual eye exams? [facility] If pt is not established with a provider, would they like to be referred to a provider to establish care? Yes .   Dental Screening: Recommended annual dental exams for proper oral hygiene    Community Resource Referral / Chronic Care Management: CRR required this visit?  No   CCM required this visit?  No     Plan:     I have personally reviewed and noted the following in the patient's chart:   Medical and social history Use of alcohol, tobacco or illicit drugs  Current medications and supplements including opioid prescriptions. Patient is not currently taking opioid prescriptions. Functional ability and status Nutritional status Physical activity Advanced directives List of other physicians Hospitalizations, surgeries, and ER visits in previous 12 months Vitals Screenings to include cognitive, depression, and falls Referrals and appointments  In addition, I have reviewed and discussed with patient certain preventive protocols,  quality metrics, and best practice recommendations. A written personalized care plan for preventive services as well as general preventive health recommendations were provided to patient.     Maryan Puls, LPN   11/18/345   After Visit Summary: (MyChart) Due to this being a telephonic visit, the after visit summary with patients personalized plan was offered to patient via MyChart   Nurse Notes: Order for mammogram placed.

## 2023-03-18 NOTE — Patient Instructions (Addendum)
Anita Buckley , Thank you for taking time to come for your Medicare Wellness Visit. I appreciate your ongoing commitment to your health goals. Please review the following plan we discussed and let me know if I can assist you in the future.   Referrals/Orders/Follow-Ups/Clinician Recommendations: Aim for 30 minutes of exercise or brisk walking, 6-8 glasses of water, and 5 servings of fruits and vegetables each day.   You have an order for:  []   2D Mammogram  [x]   3D Mammogram  []   Bone Density     Please call for appointment:  [facility]  [address] [place] [address] [phone]  [facility] [address] [place], [place] [address] [phone]  [facility] [address] Geanie Logan [place], [place] [address] [phone]    Make sure to wear two-piece clothing.  No lotions, powders, or deodorants the day of the appointment. Make sure to bring picture ID and insurance card.  Bring list of medications you are currently taking including any supplements.   Schedule your [place] screening mammogram through MyChart!   Log into your MyChart account.  Go to 'Visit' (or 'Appointments' if on mobile App) --> Schedule an Appointment  Under 'Select a Reason for Visit' choose the Mammogram Screening option.  Complete the pre-visit questions and select the time and place that best fits your schedule.    This is a list of the screening recommended for you and due dates:  Health Maintenance  Topic Date Due   Hepatitis C Screening  Never done   COVID-19 Vaccine (8 - 2023-24 season) 07/11/2022   Medicare Annual Wellness Visit  02/11/2023   Flu Shot  03/18/2023   DTaP/Tdap/Td vaccine (4 - Td or Tdap) 01/10/2024   Mammogram  04/28/2024   Colon Cancer Screening  04/23/2032   Pneumonia Vaccine  Completed   DEXA scan (bone density measurement)   Completed   Zoster (Shingles) Vaccine  Completed   HPV Vaccine  Aged Out    Advanced directives: (Copy Requested) Please bring a copy of your health care power of attorney and living will to the office to be added to your chart at your convenience.  Next Medicare Annual Wellness Visit scheduled for next year: Yes  Preventive Care 29 Years and Older, Female Preventive care refers to lifestyle choices and visits with your health care provider that can promote health and wellness. What does preventive care include? A yearly physical exam. This is also called an annual well check. Dental exams once or twice a year. Routine eye exams. Ask your health care provider how often you should have your eyes checked. Personal lifestyle choices, including: Daily care of your teeth and gums. Regular physical activity. Eating a healthy diet. Avoiding tobacco and drug use. Limiting alcohol use. Practicing safe sex. Taking low-dose aspirin every day. Taking vitamin and mineral supplements as recommended by your health care provider. What happens during an annual well check? The services and screenings done by your health care provider during your annual well check will depend on your age, overall health, lifestyle risk factors, and family history of disease. Counseling  Your health care provider may ask you questions about your: Alcohol use. Tobacco use. Drug use. Emotional well-being. Home and relationship well-being. Sexual activity. Eating habits. History of falls. Memory and ability to understand (cognition). Work and work Astronomer. Reproductive health. Screening  You may have  the following tests or measurements: Height, weight, and BMI. Blood pressure. Lipid and cholesterol levels. These may be checked every 5 years, or more frequently if you are over 47 years old. Skin check. Lung cancer screening. You may have this screening every year starting at age 75 if you have a 30-pack-year  history of smoking and currently smoke or have quit within the past 15 years. Fecal occult blood test (FOBT) of the stool. You may have this test every year starting at age 23. Flexible sigmoidoscopy or colonoscopy. You may have a sigmoidoscopy every 5 years or a colonoscopy every 10 years starting at age 39. Hepatitis C blood test. Hepatitis B blood test. Sexually transmitted disease (STD) testing. Diabetes screening. This is done by checking your blood sugar (glucose) after you have not eaten for a while (fasting). You may have this done every 1-3 years. Bone density scan. This is done to screen for osteoporosis. You may have this done starting at age 38. Mammogram. This may be done every 1-2 years. Talk to your health care provider about how often you should have regular mammograms. Talk with your health care provider about your test results, treatment options, and if necessary, the need for more tests. Vaccines  Your health care provider may recommend certain vaccines, such as: Influenza vaccine. This is recommended every year. Tetanus, diphtheria, and acellular pertussis (Tdap, Td) vaccine. You may need a Td booster every 10 years. Zoster vaccine. You may need this after age 67. Pneumococcal 13-valent conjugate (PCV13) vaccine. One dose is recommended after age 3. Pneumococcal polysaccharide (PPSV23) vaccine. One dose is recommended after age 77. Talk to your health care provider about which screenings and vaccines you need and how often you need them. This information is not intended to replace advice given to you by your health care provider. Make sure you discuss any questions you have with your health care provider. Document Released: 08/30/2015 Document Revised: 04/22/2016 Document Reviewed: 06/04/2015 Elsevier Interactive Patient Education  2017 [employer].  Fall Prevention in the Home Falls can cause injuries. They can happen to people of all ages. There are many things you can  do to make your home safe and to help prevent falls. What can I do on the outside of my home? Regularly fix the edges of walkways and driveways and fix any cracks. Remove anything that might make you trip as you walk through a door, such as a raised step or threshold. Trim any bushes or trees on the path to your home. Use bright outdoor lighting. Clear any walking paths of anything that might make someone trip, such as rocks or tools. Regularly check to see if handrails are loose or broken. Make sure that both sides of any steps have handrails. Any raised decks and porches should have guardrails on the edges. Have any leaves, snow, or ice cleared regularly. Use sand or salt on walking paths during winter. Clean up any spills in your garage right away. This includes oil or grease spills. What can I do in the bathroom? Use night lights. Install grab bars by the toilet and in the tub and shower. Do not use towel bars as grab bars. Use non-skid mats or decals in the tub or shower. If you need to sit down in the shower, use a plastic, non-slip stool. Keep the floor dry. Clean up any water that spills on the floor as soon as it happens. Remove soap buildup in the tub or shower regularly. Attach bath mats  securely with double-sided non-slip rug tape. Do not have throw rugs and other things on the floor that can make you trip. What can I do in the bedroom? Use night lights. Make sure that you have a light by your bed that is easy to reach. Do not use any sheets or blankets that are too big for your bed. They should not hang down onto the floor. Have a firm chair that has side arms. You can use this for support while you get dressed. Do not have throw rugs and other things on the floor that can make you trip. What can I do in the kitchen? Clean up any spills right away. Avoid walking on wet floors. Keep items that you use a lot in easy-to-reach places. If you need to reach something above you,  use a strong step stool that has a grab bar. Keep electrical cords out of the way. Do not use floor polish or wax that makes floors slippery. If you must use wax, use non-skid floor wax. Do not have throw rugs and other things on the floor that can make you trip. What can I do with my stairs? Do not leave any items on the stairs. Make sure that there are handrails on both sides of the stairs and use them. Fix handrails that are broken or loose. Make sure that handrails are as long as the stairways. Check any carpeting to make sure that it is firmly attached to the stairs. Fix any carpet that is loose or worn. Avoid having throw rugs at the top or bottom of the stairs. If you do have throw rugs, attach them to the floor with carpet tape. Make sure that you have a light switch at the top of the stairs and the bottom of the stairs. If you do not have them, ask someone to add them for you. What else can I do to help prevent falls? Wear shoes that: Do not have high heels. Have rubber bottoms. Are comfortable and fit you well. Are closed at the toe. Do not wear sandals. If you use a stepladder: Make sure that it is fully opened. Do not climb a closed stepladder. Make sure that both sides of the stepladder are locked into place. Ask someone to hold it for you, if possible. Clearly mark and make sure that you can see: Any grab bars or handrails. First and last steps. Where the edge of each step is. Use tools that help you move around (mobility aids) if they are needed. These include: Canes. Walkers. Scooters. Crutches. Turn on the lights when you go into a dark area. Replace any light bulbs as soon as they burn out. Set up your furniture so you have a clear path. Avoid moving your furniture around. If any of your floors are uneven, fix them. If there are any pets around you, be aware of where they are. Review your medicines with your doctor. Some medicines can make you feel dizzy. This can  increase your chance of falling. Ask your doctor what other things that you can do to help prevent falls. This information is not intended to replace advice given to you by your health care provider. Make sure you discuss any questions you have with your health care provider. Document Released: 05/30/2009 Document Revised: 01/09/2016 Document Reviewed: 09/07/2014 Elsevier Interactive Patient Education  2017 [employer].

## 2023-03-18 NOTE — Addendum Note (Signed)
Addended by: Roxy Manns A on: 03/18/2023 11:10 AM   Modules accepted: Orders

## 2023-04-08 DIAGNOSIS — F411 Generalized anxiety disorder: Secondary | ICD-10-CM | POA: Diagnosis not present

## 2023-04-08 DIAGNOSIS — F4321 Adjustment disorder with depressed mood: Secondary | ICD-10-CM | POA: Diagnosis not present

## 2023-04-14 ENCOUNTER — Encounter: Payer: Self-pay | Admitting: Oncology

## 2023-04-14 ENCOUNTER — Inpatient Hospital Stay: Payer: Medicare Other | Attending: Oncology | Admitting: Oncology

## 2023-04-14 ENCOUNTER — Inpatient Hospital Stay: Payer: Medicare Other

## 2023-04-14 VITALS — BP 129/83 | HR 65 | Temp 96.9°F | Resp 18 | Ht 65.0 in | Wt 153.1 lb

## 2023-04-14 DIAGNOSIS — Z79899 Other long term (current) drug therapy: Secondary | ICD-10-CM | POA: Diagnosis not present

## 2023-04-14 DIAGNOSIS — G4700 Insomnia, unspecified: Secondary | ICD-10-CM | POA: Insufficient documentation

## 2023-04-14 DIAGNOSIS — R5383 Other fatigue: Secondary | ICD-10-CM

## 2023-04-14 DIAGNOSIS — C8300 Small cell B-cell lymphoma, unspecified site: Secondary | ICD-10-CM

## 2023-04-14 DIAGNOSIS — C9110 Chronic lymphocytic leukemia of B-cell type not having achieved remission: Secondary | ICD-10-CM

## 2023-04-14 DIAGNOSIS — I10 Essential (primary) hypertension: Secondary | ICD-10-CM | POA: Diagnosis not present

## 2023-04-14 DIAGNOSIS — E785 Hyperlipidemia, unspecified: Secondary | ICD-10-CM | POA: Insufficient documentation

## 2023-04-14 LAB — CBC WITH DIFFERENTIAL/PLATELET
Abs Immature Granulocytes: 0.03 10*3/uL (ref 0.00–0.07)
Basophils Absolute: 0 10*3/uL (ref 0.0–0.1)
Basophils Relative: 0 %
Eosinophils Absolute: 0.2 10*3/uL (ref 0.0–0.5)
Eosinophils Relative: 1 %
HCT: 39.8 % (ref 36.0–46.0)
Hemoglobin: 12.9 g/dL (ref 12.0–15.0)
Immature Granulocytes: 0 %
Lymphocytes Relative: 59 %
Lymphs Abs: 7.8 10*3/uL — ABNORMAL HIGH (ref 0.7–4.0)
MCH: 29 pg (ref 26.0–34.0)
MCHC: 32.4 g/dL (ref 30.0–36.0)
MCV: 89.4 fL (ref 80.0–100.0)
Monocytes Absolute: 1.2 10*3/uL — ABNORMAL HIGH (ref 0.1–1.0)
Monocytes Relative: 9 %
Neutro Abs: 4.1 10*3/uL (ref 1.7–7.7)
Neutrophils Relative %: 31 %
Platelets: 157 10*3/uL (ref 150–400)
RBC: 4.45 MIL/uL (ref 3.87–5.11)
RDW: 12.3 % (ref 11.5–15.5)
Smear Review: NORMAL
WBC: 13.3 10*3/uL — ABNORMAL HIGH (ref 4.0–10.5)
nRBC: 0 % (ref 0.0–0.2)

## 2023-04-14 LAB — FERRITIN: Ferritin: 32 ng/mL (ref 11–307)

## 2023-04-14 LAB — VITAMIN B12: Vitamin B-12: 225 pg/mL (ref 180–914)

## 2023-04-14 LAB — IRON AND TIBC
Iron: 78 ug/dL (ref 28–170)
Saturation Ratios: 23 % (ref 10.4–31.8)
TIBC: 339 ug/dL (ref 250–450)
UIBC: 261 ug/dL

## 2023-04-14 LAB — FOLATE: Folate: 13.3 ng/mL (ref >5.9)

## 2023-04-14 NOTE — Progress Notes (Signed)
Hematology/Oncology Consult note [facility]  Telephone:([phone] [phone]  Patient Care Team: Tower, Audrie Gallus, MD as PCP - General Creig Hines, MD as Consulting Physician (Oncology)   Name of the patient: Anita Buckley  [id number]  [birth year]   Date of visit: 04/14/23  Diagnosis- SLL/CLL   Chief complaint/ Reason for visit- routine f/u of CLL  Heme/Onc history:  Patient is a 72 year old female who was diagnosed with SLL back in 2015.  She was having some problems with her thyroid gland back then an ultrasound of the soft tissue head and neck in September 2015 revealed homogeneous thyroid gland with several thyroid nodules.  Several enlarged left-sided neck lymph nodes.  FNA of the left neck no was consistent with clonal population of lymphocytes CD5 positive, CD23 positive and CD43 8+.  It was negative for malignant cells.  She also had a PET scan subsequently and her last PET scan was in 2016 which showed stable mild metabolism within a 1.2 cm level 3 lymph node but no evidence of hypermetabolism or significant adenopathy elsewhere.  CT chest in February 2018 revealed mild thoracic lymphadenopathy compatible with CLL.     Interval history-patient has been stressed taking care of her husband who has Parkinson's.  She has also been struggling with insomnia.  She has been feeling fatigued during the day  ECOG PS- 0 Pain scale- 0   Review of systems- Review of Systems  Constitutional:  Positive for malaise/fatigue. Negative for chills, fever and weight loss.  HENT:  Negative for congestion, ear discharge and nosebleeds.   Eyes:  Negative for blurred vision.  Respiratory:  Negative for cough, hemoptysis, sputum production, shortness of breath and wheezing.   Cardiovascular:  Negative for chest pain, palpitations, orthopnea and claudication.  Gastrointestinal:  Negative for abdominal pain, blood in stool, constipation, diarrhea, heartburn,  melena, nausea and vomiting.  Genitourinary:  Negative for dysuria, flank pain, frequency, hematuria and urgency.  Musculoskeletal:  Negative for back pain, joint pain and myalgias.  Skin:  Negative for rash.  Neurological:  Negative for dizziness, tingling, focal weakness, seizures, weakness and headaches.  Endo/Heme/Allergies:  Does not bruise/bleed easily.  Psychiatric/Behavioral:  Negative for depression and suicidal ideas. The patient does not have insomnia.       Allergies  Allergen Reactions   Bee Venom     Anaphylaxis    Minocycline Nausea And Vomiting   Penicillins     REACTION: reaction as a child     Past Medical History:  Diagnosis Date   Anxiety    Cancer (HCC)    B CELL LYMPHOMA   Chronic lymphocytic leukemia (CLL), B-cell (HCC) 02/11/2016   Depression    History of anxiety    History of depression    History of fibrocystic disease of breast    History of hyperlipidemia    Hypertension    Situational stress      Past Surgical History:  Procedure Laterality Date   COLONOSCOPY  12/2005   polyps, tics   COLONOSCOPY WITH PROPOFOL N/A 05/11/2016   Procedure: COLONOSCOPY WITH PROPOFOL;  Surgeon: Scot Jun, MD;  Location: [facility] ENDOSCOPY;  Service: Endoscopy;  Laterality: N/A;   THYROIDECTOMY Left 08/20/2015   Procedure: THYROIDECTOMY/ HEMITHYROIDECTOMY;  Surgeon: Linus Salmons, MD;  Location: [facility] ORS;  Service: ENT;  Laterality: Left;   TUBAL LIGATION      Social History   Socioeconomic History   Marital status: Married    Spouse name:  Not on file   Number of children: Not on file   Years of education: Not on file   Highest education level: Not on file  Occupational History   Not on file  Tobacco Use   Smoking status: Never   Smokeless tobacco: Never  Vaping Use   Vaping status: Never Used  Substance and Sexual Activity   Alcohol use: Yes    Alcohol/week: 5.0 - 10.0 standard drinks of alcohol    Types: 5 - 10 Glasses of wine per week     Comment: occ (wine with dinner)   Drug use: No   Sexual activity: Not on file  Other Topics Concern   Not on file  Social History Narrative   Lives at home in private residence with husband   Social Determinants of Health   Financial Resource Strain: Low Risk  (03/18/2023)   Overall Financial Resource Strain (CARDIA)    Difficulty of Paying Living Expenses: Not hard at all  Food Insecurity: No Food Insecurity (03/18/2023)   Hunger Vital Sign    Worried About Running Out of Food in the Last Year: Never true    Ran Out of Food in the Last Year: Never true  Transportation Needs: No Transportation Needs (03/18/2023)   PRAPARE - Administrator, Civil Service (Medical): No    Lack of Transportation (Non-Medical): No  Physical Activity: Sufficiently Active (03/18/2023)   Exercise Vital Sign    Days of Exercise per Week: 4 days    Minutes of Exercise per Session: 40 min  Stress: No Stress Concern Present (03/18/2023)   [employer] of Occupational Health - Occupational Stress Questionnaire    Feeling of Stress : Not at all  Social Connections: Moderately Isolated (03/18/2023)   Social Connection and Isolation Panel [NHANES]    Frequency of Communication with Friends and Family: More than three times a week    Frequency of Social Gatherings with Friends and Family: Once a week    Attends Religious Services: Never    Database administrator or Organizations: No    Attends Banker Meetings: Never    Marital Status: Married  Catering manager Violence: Not At Risk (03/18/2023)   Humiliation, Afraid, Rape, and Kick questionnaire    Fear of Current or Ex-Partner: No    Emotionally Abused: No    Physically Abused: No    Sexually Abused: No    Family History  Problem Relation Age of Onset   Diabetes Father        late in life   Alcohol abuse Father    Hyperlipidemia Mother    Mental illness Mother    Depression Sister    Leukemia Brother    Bladder Cancer Brother     Schizophrenia Other        Grandfather   Breast cancer Neg Hx      Current Outpatient Medications:    ALPRAZolam (XANAX) 0.5 MG tablet, Take 1 tablet (0.5 mg total) by mouth daily as needed. for anxiety, Disp: 30 tablet, Rfl: 0   amLODipine (NORVASC) 5 MG tablet, Take 1 tablet (5 mg total) by mouth daily., Disp: 90 tablet, Rfl: 3   busPIRone (BUSPAR) 15 MG tablet, Take 0.5 tablets (7.5 mg total) by mouth 2 (two) times daily., Disp: 90 tablet, Rfl: 3   EPINEPHrine 0.3 mg/0.3 mL IJ SOAJ injection, INJECT 0.3 ML INTO MUSCLE AS NEEDED, Disp: 2 each, Rfl: 3   latanoprost (XALATAN) 0.005 % ophthalmic solution, Place  1 drop into both eyes at bedtime., Disp: , Rfl:    rosuvastatin (CRESTOR) 10 MG tablet, TAKE ONE TABLET BY MOUTH EVERY DAY, Disp: 90 tablet, Rfl: 2   timolol (TIMOPTIC) 0.5 % ophthalmic solution, 1 drop daily., Disp: , Rfl:   Physical exam:  Vitals:   04/14/23 1108 04/14/23 1114  BP: (!) 146/85 129/83  Pulse: 60 65  Resp: 18   Temp: (!) 96.9 F (36.1 C)   TempSrc: Tympanic   SpO2: 98%   Weight: 153 lb 1.6 oz (69.4 kg)   Height: 5\' 5"  (1.651 m)    Physical Exam Cardiovascular:     Rate and Rhythm: Normal rate and regular rhythm.     Heart sounds: Normal heart sounds.  Pulmonary:     Effort: Pulmonary effort is normal.     Breath sounds: Normal breath sounds.  Abdominal:     General: Bowel sounds are normal.     Palpations: Abdomen is soft.     Comments: No palpable hepatosplenomegaly  Lymphadenopathy:     Comments: Palpable left cervical adenopathy.  No palpable bilateral axillary or inguinal adenopathy  Skin:    General: Skin is warm and dry.  Neurological:     Mental Status: She is alert and oriented to person, place, and time.         Latest Ref Rng & Units 07/23/2022    9:20 AM  CMP  Glucose 70 - 99 mg/dL 161   BUN 6 - 23 mg/dL 21   Creatinine 0.96 - 1.20 mg/dL 0.45   Sodium 409 - 811 mEq/L 139   Potassium 3.5 - 5.1 mEq/L 4.0   Chloride 96 - 112 mEq/L  103   CO2 19 - 32 mEq/L 31   Calcium 8.4 - 10.5 mg/dL 9.3   Total Protein 6.0 - 8.3 g/dL 6.5   Total Bilirubin 0.2 - 1.2 mg/dL 0.6   Alkaline Phos 39 - 117 U/L 49   AST 0 - 37 U/L 18   ALT 0 - 35 U/L 18       Latest Ref Rng & Units 04/14/2023   10:52 AM  CBC  WBC 4.0 - 10.5 K/uL 13.3   Hemoglobin 12.0 - 15.0 g/dL 91.4   Hematocrit 78.2 - 46.0 % 39.8   Platelets 150 - 400 K/uL 157      Assessment and plan- Patient is a 72 y.o. female here for routine follow-up of CLL/SLL  Patient has mild leukocytosis/lymphocytosis which has remained stable over the last 2 years.  Absolute lymphocyte doubling time remains more than 6 months.  No evidence of anemia or thrombocytopenia.  No palpable hepatosplenomegaly.  Stable palpable left cervical adenopathy nonbulky.  It would be difficult to attribute patient's fatigue to CLL and her CLL does not require treatment at this time.  Fatigue: I will check ferritin and iron studies and B12 and folate today.  If she has evidence of iron deficiency she may benefit from IV iron   Visit Diagnosis 1. Other fatigue   2. CLL (chronic lymphocytic leukemia) (HCC)      Dr. Owens Shark, MD, MPH [facility] at [facility] [phone] 04/14/2023 3:40 PM

## 2023-04-15 ENCOUNTER — Telehealth: Payer: Self-pay | Admitting: *Deleted

## 2023-04-15 NOTE — Telephone Encounter (Signed)
I called the patient and let her know that Dr. Smith Robert says the B12 is low and she wanted to see if she wants to take oral B12 over-the-counter or come in and get monthly injections.  Patient says she is tired of being tired and so she would like to go ahead and get the monthly injections to hopefully make it go faster to feel better.  She is okay to get the injection on September 14 at 2:30

## 2023-04-15 NOTE — Telephone Encounter (Signed)
-----   Message from Creig Hines sent at 04/15/2023 10:39 AM EDT ----- B2 is low- does she want to take po b12 1000 mcg daily or come for monthly injection or give herself injections?

## 2023-04-16 ENCOUNTER — Telehealth: Payer: Self-pay | Admitting: *Deleted

## 2023-04-16 ENCOUNTER — Other Ambulatory Visit: Payer: Self-pay | Admitting: Oncology

## 2023-04-16 DIAGNOSIS — E538 Deficiency of other specified B group vitamins: Secondary | ICD-10-CM | POA: Insufficient documentation

## 2023-04-16 NOTE — Telephone Encounter (Signed)
I had told pt yest about b12 level but I got another message that she needs iv iron. When I looked at the schedule it already had an appt for the iron. I wanted to make sure that is what pt wants and she can come on that day. Pt was aware of it and I apologized for calling twice but I wanted to makes sure she was good for b12 and IV iron and she thanked me to make sure it was ok.

## 2023-04-20 DIAGNOSIS — F4321 Adjustment disorder with depressed mood: Secondary | ICD-10-CM | POA: Diagnosis not present

## 2023-04-20 DIAGNOSIS — F411 Generalized anxiety disorder: Secondary | ICD-10-CM | POA: Diagnosis not present

## 2023-04-21 ENCOUNTER — Inpatient Hospital Stay: Payer: Medicare Other

## 2023-04-21 ENCOUNTER — Other Ambulatory Visit: Payer: Self-pay | Admitting: Oncology

## 2023-04-21 ENCOUNTER — Telehealth: Payer: Self-pay | Admitting: *Deleted

## 2023-04-21 ENCOUNTER — Telehealth: Payer: Self-pay | Admitting: Oncology

## 2023-04-21 ENCOUNTER — Inpatient Hospital Stay: Payer: Medicare Other | Attending: Oncology

## 2023-04-21 VITALS — BP 127/79 | HR 78 | Temp 97.8°F | Resp 18

## 2023-04-21 DIAGNOSIS — Z79899 Other long term (current) drug therapy: Secondary | ICD-10-CM | POA: Diagnosis not present

## 2023-04-21 DIAGNOSIS — E538 Deficiency of other specified B group vitamins: Secondary | ICD-10-CM | POA: Diagnosis not present

## 2023-04-21 DIAGNOSIS — C9110 Chronic lymphocytic leukemia of B-cell type not having achieved remission: Secondary | ICD-10-CM | POA: Insufficient documentation

## 2023-04-21 MED ORDER — SODIUM CHLORIDE 0.9 % IV SOLN
1000.0000 mg | Freq: Once | INTRAVENOUS | Status: AC
  Administered 2023-04-21: 1000 mg via INTRAVENOUS
  Filled 2023-04-21: qty 10

## 2023-04-21 MED ORDER — CYANOCOBALAMIN 1000 MCG/ML IJ SOLN
1000.0000 ug | INTRAMUSCULAR | Status: DC
  Administered 2023-04-21: 1000 ug via INTRAMUSCULAR
  Filled 2023-04-21: qty 1

## 2023-04-21 MED ORDER — SODIUM CHLORIDE 0.9 % IV SOLN
Freq: Once | INTRAVENOUS | Status: AC
  Filled 2023-04-21: qty 250

## 2023-04-21 NOTE — Patient Instructions (Signed)
 [facility]  Discharge Instructions: Thank you for choosing [facility] to provide your oncology and hematology care.  If you have a lab appointment with the Cancer Center, please go directly to the Cancer Center and check in at the registration area.  Wear comfortable clothing and clothing appropriate for easy access to any Portacath or PICC line.   We strive to give you quality time with your provider. You may need to reschedule your appointment if you arrive late (15 or more minutes).  Arriving late affects you and other patients whose appointments are after yours.  Also, if you miss three or more appointments without notifying the office, you may be dismissed from the clinic at the provider's discretion.      For prescription refill requests, have your pharmacy contact our office and allow 72 hours for refills to be completed.    Today you received the following chemotherapy and/or immunotherapy agents: Monoferric    To help prevent nausea and vomiting after your treatment, we encourage you to take your nausea medication as directed.  BELOW ARE SYMPTOMS THAT SHOULD BE REPORTED IMMEDIATELY: *FEVER GREATER THAN 100.4 F (38 C) OR HIGHER *CHILLS OR SWEATING *NAUSEA AND VOMITING THAT IS NOT CONTROLLED WITH YOUR NAUSEA MEDICATION *UNUSUAL SHORTNESS OF BREATH *UNUSUAL BRUISING OR BLEEDING *URINARY PROBLEMS (pain or burning when urinating, or frequent urination) *BOWEL PROBLEMS (unusual diarrhea, constipation, pain near the anus) TENDERNESS IN MOUTH AND THROAT WITH OR WITHOUT PRESENCE OF ULCERS (sore throat, sores in mouth, or a toothache) UNUSUAL RASH, SWELLING OR PAIN  UNUSUAL VAGINAL DISCHARGE OR ITCHING   Items with * indicate a potential emergency and should be followed up as soon as possible or go to the Emergency Department if any problems should occur.  Please show the CHEMOTHERAPY ALERT CARD or IMMUNOTHERAPY ALERT CARD at check-in to  the Emergency Department and triage nurse.  Should you have questions after your visit or need to cancel or reschedule your appointment, please contact [place] [facility]  [phone] and follow the prompts.  Office hours are 8:00 a.m. to 4:30 p.m. Monday - Friday. Please note that voicemails left after 4:00 p.m. may not be returned until the following business day.  We are closed weekends and major holidays. You have access to a nurse at all times for urgent questions. Please call the main number to the clinic [phone] and follow the prompts.  For any non-urgent questions, you may also contact your provider using MyChart. We now offer e-Visits for anyone 85 and older to request care online for non-urgent symptoms. For details visit [email or web address].   Also download the MyChart app! Go to the app store, search "MyChart", open the app, select [place], and log in with your MyChart username and password.

## 2023-04-21 NOTE — Telephone Encounter (Signed)
Patient had left a message for me to call her about her next B12 injection.  Told her next 1 is going to be October 2 and she wanted to be at 10:30 AM.  Patient is aware and is good with this date and time

## 2023-04-21 NOTE — Telephone Encounter (Signed)
Pt stopped by the desk and stated she didn't know if she was supposed to get B12 injections or not. She said she would like a call to discuss if the injections or the supplements would be better

## 2023-04-26 ENCOUNTER — Other Ambulatory Visit: Payer: Self-pay | Admitting: Family Medicine

## 2023-04-26 DIAGNOSIS — F411 Generalized anxiety disorder: Secondary | ICD-10-CM | POA: Diagnosis not present

## 2023-04-26 DIAGNOSIS — F4321 Adjustment disorder with depressed mood: Secondary | ICD-10-CM | POA: Diagnosis not present

## 2023-04-27 NOTE — Telephone Encounter (Signed)
Name of Medication: Xanax Name of Pharmacy: [facility]  Last Fill or Written Date and Quantity: 03/18/23 #30 tabs/ 0 refill Last Office Visit and Type: CPE 08/01/23 Next Office Visit and Type: none scheduled    Epipen last filled on 01/23/22 so Rx has expired

## 2023-04-28 ENCOUNTER — Other Ambulatory Visit: Payer: Self-pay | Admitting: Family Medicine

## 2023-04-28 ENCOUNTER — Ambulatory Visit (INDEPENDENT_AMBULATORY_CARE_PROVIDER_SITE_OTHER): Payer: Medicare Other | Admitting: Podiatry

## 2023-04-28 ENCOUNTER — Encounter: Payer: Self-pay | Admitting: Podiatry

## 2023-04-28 DIAGNOSIS — Q666 Other congenital valgus deformities of feet: Secondary | ICD-10-CM

## 2023-04-28 DIAGNOSIS — M205X9 Other deformities of toe(s) (acquired), unspecified foot: Secondary | ICD-10-CM | POA: Diagnosis not present

## 2023-04-28 NOTE — Progress Notes (Signed)
Subjective:  Patient ID: Anita Buckley, female    DOB: 1951/06/25,  MRN: [id number] HPI Chief Complaint  Patient presents with   Foot Orthotics    Requesting new orthotics    72 y.o. female presents with the above complaint.   ROS: Denies fever chills nausea vomit muscle aches pains calf pain back pain chest pain shortness of breath.  Past Medical History:  Diagnosis Date   Anxiety    Cancer (HCC)    B CELL LYMPHOMA   Chronic lymphocytic leukemia (CLL), B-cell (HCC) 02/11/2016   Depression    History of anxiety    History of depression    History of fibrocystic disease of breast    History of hyperlipidemia    Hypertension    Situational stress    Past Surgical History:  Procedure Laterality Date   COLONOSCOPY  12/2005   polyps, tics   COLONOSCOPY WITH PROPOFOL N/A 05/11/2016   Procedure: COLONOSCOPY WITH PROPOFOL;  Surgeon: Scot Jun, MD;  Location: [facility] ENDOSCOPY;  Service: Endoscopy;  Laterality: N/A;   THYROIDECTOMY Left 08/20/2015   Procedure: THYROIDECTOMY/ HEMITHYROIDECTOMY;  Surgeon: Linus Salmons, MD;  Location: [facility] ORS;  Service: ENT;  Laterality: Left;   TUBAL LIGATION      Current Outpatient Medications:    ALPRAZolam (XANAX) 0.5 MG tablet, TAKE ONE TABLET (0.5 MG) BY MOUTH EVERY DAY AS NEEDED, Disp: 30 tablet, Rfl: 0   amLODipine (NORVASC) 5 MG tablet, Take 1 tablet (5 mg total) by mouth daily., Disp: 90 tablet, Rfl: 3   busPIRone (BUSPAR) 15 MG tablet, Take 0.5 tablets (7.5 mg total) by mouth 2 (two) times daily., Disp: 90 tablet, Rfl: 3   EPINEPHrine 0.3 mg/0.3 mL IJ SOAJ injection, INJECT 0.3ML INTO THE MUSCLE AS NEEDED, Disp: 2 each, Rfl: 1   latanoprost (XALATAN) 0.005 % ophthalmic solution, Place 1 drop into both eyes at bedtime., Disp: , Rfl:    rosuvastatin (CRESTOR) 10 MG tablet, TAKE ONE TABLET BY MOUTH EVERY DAY, Disp: 90 tablet, Rfl: 2   timolol (TIMOPTIC) 0.5 % ophthalmic solution, 1 drop daily., Disp: , Rfl:   Allergies  Allergen  Reactions   Bee Venom     Anaphylaxis    Minocycline Nausea And Vomiting   Penicillins     REACTION: reaction as a child   Review of Systems Objective:  There were no vitals filed for this visit.  General: Well developed, nourished, in no acute distress, alert and oriented x3   Dermatological: Skin is warm, dry and supple bilateral. Nails x 10 are well maintained; remaining integument appears unremarkable at this time. There are no open sores, no preulcerative lesions, no rash or signs of infection present.  Vascular: Dorsalis Pedis artery and Posterior Tibial artery pedal pulses are 2/4 bilateral with immedate capillary fill time. Pedal hair growth present. No varicosities and no lower extremity edema present bilateral.   Neruologic: Grossly intact via light touch bilateral. Vibratory intact via tuning fork bilateral. Protective threshold with Semmes Wienstein monofilament intact to all pedal sites bilateral. Patellar and Achilles deep tendon reflexes 2+ bilateral. No Babinski or clonus noted bilateral.   Musculoskeletal: No gross boney pedal deformities bilateral. No pain, crepitus, or limitation noted with foot and ankle range of motion bilateral. Muscular strength 5/5 in all groups tested bilateral.  Pes planovalgus bilateral.  She has limited range of motion of the first metatarsophalangeal joint bilateral.  Appears to have some posterior tibial tendon dysfunction but minimal.  Currently no tenderness on palpation  of the heel.  Gait: Unassisted, Nonantalgic.    Radiographs:  None taken  Assessment & Plan:   Assessment: Pes planovalgus history of Planter fasciitis and hallux limitus.  Plan: Discussed etiology pathology conservative surgical therapies her primary care provider wants her to get back into some new orthotics we are going to scan her today for a new set of orthotics.  These orthotics need to be with an extrinsic post and a deeper heel cup for her flatfoot.  The cast  is to neutral.     Daniesha Driver T. [place], [place]

## 2023-05-03 ENCOUNTER — Ambulatory Visit
Admission: RE | Admit: 2023-05-03 | Discharge: 2023-05-03 | Disposition: A | Discharge status: Home / Self Care | Payer: Medicare Other | Source: Ambulatory Visit | Attending: Family Medicine | Admitting: Family Medicine

## 2023-05-03 DIAGNOSIS — Z1231 Encounter for screening mammogram for malignant neoplasm of breast: Secondary | ICD-10-CM | POA: Insufficient documentation

## 2023-05-04 ENCOUNTER — Ambulatory Visit: Payer: Medicare Other | Admitting: Podiatry

## 2023-05-05 ENCOUNTER — Encounter: Payer: Self-pay | Admitting: Oncology

## 2023-05-05 DIAGNOSIS — Z23 Encounter for immunization: Secondary | ICD-10-CM | POA: Diagnosis not present

## 2023-05-06 DIAGNOSIS — F4321 Adjustment disorder with depressed mood: Secondary | ICD-10-CM | POA: Diagnosis not present

## 2023-05-06 DIAGNOSIS — F411 Generalized anxiety disorder: Secondary | ICD-10-CM | POA: Diagnosis not present

## 2023-05-17 DIAGNOSIS — F4321 Adjustment disorder with depressed mood: Secondary | ICD-10-CM | POA: Diagnosis not present

## 2023-05-17 DIAGNOSIS — F411 Generalized anxiety disorder: Secondary | ICD-10-CM | POA: Diagnosis not present

## 2023-05-17 NOTE — Progress Notes (Signed)
CFO order was placed today  Items to be fit when in  [employer], CFo, CFm

## 2023-05-19 ENCOUNTER — Inpatient Hospital Stay: Payer: Medicare Other | Attending: Oncology

## 2023-05-19 DIAGNOSIS — Z79899 Other long term (current) drug therapy: Secondary | ICD-10-CM | POA: Diagnosis not present

## 2023-05-19 DIAGNOSIS — E538 Deficiency of other specified B group vitamins: Secondary | ICD-10-CM | POA: Insufficient documentation

## 2023-05-19 MED ORDER — CYANOCOBALAMIN 1000 MCG/ML IJ SOLN
1000.0000 ug | INTRAMUSCULAR | Status: DC
  Administered 2023-05-19: 1000 ug via INTRAMUSCULAR
  Filled 2023-05-19: qty 1

## 2023-05-20 DIAGNOSIS — Z23 Encounter for immunization: Secondary | ICD-10-CM | POA: Diagnosis not present

## 2023-05-27 ENCOUNTER — Telehealth: Payer: Self-pay | Admitting: Family Medicine

## 2023-05-27 DIAGNOSIS — F411 Generalized anxiety disorder: Secondary | ICD-10-CM | POA: Diagnosis not present

## 2023-05-27 DIAGNOSIS — F4321 Adjustment disorder with depressed mood: Secondary | ICD-10-CM | POA: Diagnosis not present

## 2023-05-27 NOTE — Telephone Encounter (Signed)
Patient is receiving b12 injections where she  goes to the cancer center to get.She has already had 2 injections,and would like to know if she could come to our office to receive them. She would also like Dr Milinda Antis to know that she did find a mental health therapist named [facility] in [place].

## 2023-05-30 NOTE — Telephone Encounter (Signed)
Thanks for the update It sounds like her oncologist wanted her to get monthly B12 injections  Please schedule a a nurse visit for next injection when she is due and also I would like to get a B12 level that day just before her shot so we can see how it is progressing  Thanks

## 2023-05-31 NOTE — Telephone Encounter (Signed)
Patient has been scheduled

## 2023-06-10 ENCOUNTER — Other Ambulatory Visit: Payer: Self-pay | Admitting: Family Medicine

## 2023-06-10 NOTE — Telephone Encounter (Signed)
Refill request for ALPRAZOLAM 0.5 MG TAB   LOV - 07/31/22 Next OV - not scheduled Last refill - 04/27/23 #30/0

## 2023-06-14 DIAGNOSIS — F411 Generalized anxiety disorder: Secondary | ICD-10-CM | POA: Diagnosis not present

## 2023-06-14 DIAGNOSIS — F4321 Adjustment disorder with depressed mood: Secondary | ICD-10-CM | POA: Diagnosis not present

## 2023-06-17 ENCOUNTER — Other Ambulatory Visit: Payer: Medicare Other

## 2023-06-18 ENCOUNTER — Ambulatory Visit: Payer: Medicare Other

## 2023-06-18 NOTE — Progress Notes (Signed)
Patient presents today to pick up custom molded foot orthotics, diagnosed with pes planus  by Dr. Al Corpus.   Orthotics were dispensed and fit was satisfactory. Reviewed instructions for break-in and wear. Written instructions given to patient.  Patient will follow up as needed.   Addison Bailey CPed, Cfo, CFm

## 2023-06-20 ENCOUNTER — Telehealth: Payer: Self-pay | Admitting: Family Medicine

## 2023-06-20 DIAGNOSIS — E538 Deficiency of other specified B group vitamins: Secondary | ICD-10-CM

## 2023-06-20 NOTE — Telephone Encounter (Signed)
-----   Message from Vincenza Hews sent at 06/09/2023  2:08 PM EDT ----- Regarding: Lab Mon 06/21/23 Hello,   Patient has a lab appointment on Monday 06/21/23 for B12. Can we get lab orders please.   Thanks

## 2023-06-21 ENCOUNTER — Ambulatory Visit (INDEPENDENT_AMBULATORY_CARE_PROVIDER_SITE_OTHER): Payer: Medicare Other

## 2023-06-21 ENCOUNTER — Other Ambulatory Visit (INDEPENDENT_AMBULATORY_CARE_PROVIDER_SITE_OTHER): Payer: Medicare Other

## 2023-06-21 DIAGNOSIS — E538 Deficiency of other specified B group vitamins: Secondary | ICD-10-CM | POA: Diagnosis not present

## 2023-06-21 LAB — VITAMIN B12: Vitamin B-12: 388 pg/mL (ref 211–911)

## 2023-06-21 MED ORDER — CYANOCOBALAMIN 1000 MCG/ML IJ SOLN
1000.0000 ug | Freq: Once | INTRAMUSCULAR | Status: AC
Start: 2023-06-21 — End: 2023-06-21
  Administered 2023-06-21: 1000 ug via INTRAMUSCULAR

## 2023-06-21 NOTE — Progress Notes (Signed)
Patient presented for B 12 injection given by Makinzy Cleere, CMA to right deltoid, patient voiced no concerns nor showed any signs of distress during injection.  

## 2023-06-22 ENCOUNTER — Other Ambulatory Visit: Payer: Medicare Other

## 2023-06-29 DIAGNOSIS — F4321 Adjustment disorder with depressed mood: Secondary | ICD-10-CM | POA: Diagnosis not present

## 2023-06-29 DIAGNOSIS — F411 Generalized anxiety disorder: Secondary | ICD-10-CM | POA: Diagnosis not present

## 2023-07-21 ENCOUNTER — Ambulatory Visit (INDEPENDENT_AMBULATORY_CARE_PROVIDER_SITE_OTHER): Payer: Medicare Other

## 2023-07-21 DIAGNOSIS — E538 Deficiency of other specified B group vitamins: Secondary | ICD-10-CM

## 2023-07-21 MED ORDER — CYANOCOBALAMIN 1000 MCG/ML IJ SOLN
1000.0000 ug | Freq: Once | INTRAMUSCULAR | Status: AC
Start: 2023-07-21 — End: 2023-07-21
  Administered 2023-07-21: 1000 ug via INTRAMUSCULAR

## 2023-07-21 NOTE — Progress Notes (Signed)
Per orders of Dr. Roxy Manns, injection of vitamin b 12  given by Lewanda Rife in right deltoid at pts request. Patient tolerated injection well. Patient will make appointment for 1 month.

## 2023-07-23 DIAGNOSIS — F4321 Adjustment disorder with depressed mood: Secondary | ICD-10-CM | POA: Diagnosis not present

## 2023-07-23 DIAGNOSIS — F411 Generalized anxiety disorder: Secondary | ICD-10-CM | POA: Diagnosis not present

## 2023-07-24 ENCOUNTER — Other Ambulatory Visit: Payer: Self-pay | Admitting: Family Medicine

## 2023-07-26 NOTE — Telephone Encounter (Signed)
Med refilled once  

## 2023-07-26 NOTE — Telephone Encounter (Signed)
Patient scheduled.

## 2023-07-26 NOTE — Telephone Encounter (Signed)
Pt is due for her CPE (labs prior) on or after 08/02/23, please schedule and then route back to me to refill. thanks

## 2023-07-28 ENCOUNTER — Telehealth: Payer: Self-pay | Admitting: Family Medicine

## 2023-07-28 DIAGNOSIS — E538 Deficiency of other specified B group vitamins: Secondary | ICD-10-CM

## 2023-07-28 DIAGNOSIS — I10 Essential (primary) hypertension: Secondary | ICD-10-CM

## 2023-07-28 DIAGNOSIS — E038 Other specified hypothyroidism: Secondary | ICD-10-CM

## 2023-07-28 DIAGNOSIS — R7303 Prediabetes: Secondary | ICD-10-CM

## 2023-07-28 DIAGNOSIS — E7800 Pure hypercholesterolemia, unspecified: Secondary | ICD-10-CM

## 2023-07-28 NOTE — Telephone Encounter (Signed)
-----   Message from Lovena Neighbours sent at 07/26/2023  1:25 PM EST ----- Regarding: Fasting labs for Friday 12.13.24 Please put physical lab orders in future. Thank you, Denny Peon

## 2023-07-30 ENCOUNTER — Other Ambulatory Visit (INDEPENDENT_AMBULATORY_CARE_PROVIDER_SITE_OTHER): Payer: Medicare Other

## 2023-07-30 ENCOUNTER — Other Ambulatory Visit: Payer: Medicare Other

## 2023-07-30 DIAGNOSIS — R7303 Prediabetes: Secondary | ICD-10-CM | POA: Diagnosis not present

## 2023-07-30 DIAGNOSIS — E038 Other specified hypothyroidism: Secondary | ICD-10-CM

## 2023-07-30 DIAGNOSIS — E7800 Pure hypercholesterolemia, unspecified: Secondary | ICD-10-CM

## 2023-07-30 DIAGNOSIS — E538 Deficiency of other specified B group vitamins: Secondary | ICD-10-CM | POA: Diagnosis not present

## 2023-07-30 DIAGNOSIS — I10 Essential (primary) hypertension: Secondary | ICD-10-CM | POA: Diagnosis not present

## 2023-07-30 LAB — CBC WITH DIFFERENTIAL/PLATELET
Basophils Absolute: 0 10*3/uL (ref 0.0–0.1)
Basophils Relative: 0.3 % (ref 0.0–3.0)
Eosinophils Absolute: 0.2 10*3/uL (ref 0.0–0.7)
Eosinophils Relative: 1.8 % (ref 0.0–5.0)
HCT: 40.1 % (ref 36.0–46.0)
Hemoglobin: 13.2 g/dL (ref 12.0–15.0)
Lymphocytes Relative: 65 % — ABNORMAL HIGH (ref 12.0–46.0)
Lymphs Abs: 8.8 10*3/uL — ABNORMAL HIGH (ref 0.7–4.0)
MCHC: 33 g/dL (ref 30.0–36.0)
MCV: 91 fl (ref 78.0–100.0)
Monocytes Absolute: 0.8 10*3/uL (ref 0.1–1.0)
Monocytes Relative: 5.7 % (ref 3.0–12.0)
Neutro Abs: 3.7 10*3/uL (ref 1.4–7.7)
Neutrophils Relative %: 27.2 % — ABNORMAL LOW (ref 43.0–77.0)
Platelets: 155 10*3/uL (ref 150.0–400.0)
RBC: 4.4 Mil/uL (ref 3.87–5.11)
RDW: 13.7 % (ref 11.5–15.5)
WBC: 13.5 10*3/uL — ABNORMAL HIGH (ref 4.0–10.5)

## 2023-07-30 LAB — COMPREHENSIVE METABOLIC PANEL
ALT: 19 U/L (ref 0–35)
AST: 19 U/L (ref 0–37)
Albumin: 4.6 g/dL (ref 3.5–5.2)
Alkaline Phosphatase: 51 U/L (ref 39–117)
BUN: 19 mg/dL (ref 6–23)
CO2: 30 meq/L (ref 19–32)
Calcium: 9.1 mg/dL (ref 8.4–10.5)
Chloride: 104 meq/L (ref 96–112)
Creatinine, Ser: 0.74 mg/dL (ref 0.40–1.20)
GFR: 81.78 mL/min (ref >60.00)
Glucose, Bld: 91 mg/dL (ref 70–99)
Potassium: 4 meq/L (ref 3.5–5.1)
Sodium: 142 meq/L (ref 135–145)
Total Bilirubin: 0.8 mg/dL (ref 0.2–1.2)
Total Protein: 6.3 g/dL (ref 6.0–8.3)

## 2023-07-30 LAB — LIPID PANEL
Cholesterol: 130 mg/dL (ref 0–200)
HDL: 54.8 mg/dL (ref >39.00)
LDL Cholesterol: 56 mg/dL (ref 0–99)
NonHDL: 74.84
Total CHOL/HDL Ratio: 2
Triglycerides: 92 mg/dL (ref 0.0–149.0)
VLDL: 18.4 mg/dL (ref 0.0–40.0)

## 2023-07-30 LAB — T4, FREE: Free T4: 0.69 ng/dL (ref 0.60–1.60)

## 2023-07-30 LAB — TSH: TSH: 3.19 u[IU]/mL (ref 0.35–5.50)

## 2023-07-30 LAB — HEMOGLOBIN A1C: Hgb A1c MFr Bld: 5.9 % (ref 4.6–6.5)

## 2023-07-30 LAB — VITAMIN B12: Vitamin B-12: 473 pg/mL (ref 211–911)

## 2023-08-06 ENCOUNTER — Encounter: Payer: Self-pay | Admitting: Family Medicine

## 2023-08-06 ENCOUNTER — Ambulatory Visit (INDEPENDENT_AMBULATORY_CARE_PROVIDER_SITE_OTHER): Payer: Medicare Other | Admitting: Family Medicine

## 2023-08-06 VITALS — BP 126/74 | HR 87 | Temp 97.8°F | Ht 64.75 in | Wt 148.0 lb

## 2023-08-06 DIAGNOSIS — E038 Other specified hypothyroidism: Secondary | ICD-10-CM

## 2023-08-06 DIAGNOSIS — Z1211 Encounter for screening for malignant neoplasm of colon: Secondary | ICD-10-CM

## 2023-08-06 DIAGNOSIS — F411 Generalized anxiety disorder: Secondary | ICD-10-CM | POA: Diagnosis not present

## 2023-08-06 DIAGNOSIS — C9110 Chronic lymphocytic leukemia of B-cell type not having achieved remission: Secondary | ICD-10-CM | POA: Diagnosis not present

## 2023-08-06 DIAGNOSIS — I10 Essential (primary) hypertension: Secondary | ICD-10-CM | POA: Diagnosis not present

## 2023-08-06 DIAGNOSIS — R7303 Prediabetes: Secondary | ICD-10-CM

## 2023-08-06 DIAGNOSIS — E538 Deficiency of other specified B group vitamins: Secondary | ICD-10-CM | POA: Diagnosis not present

## 2023-08-06 DIAGNOSIS — M8580 Other specified disorders of bone density and structure, unspecified site: Secondary | ICD-10-CM

## 2023-08-06 DIAGNOSIS — E7800 Pure hypercholesterolemia, unspecified: Secondary | ICD-10-CM | POA: Diagnosis not present

## 2023-08-06 DIAGNOSIS — E2839 Other primary ovarian failure: Secondary | ICD-10-CM

## 2023-08-06 MED ORDER — BUSPIRONE HCL 15 MG PO TABS: 15.0000 mg | ORAL_TABLET | Freq: Two times a day (BID) | ORAL | 1 refills | Status: DC

## 2023-08-06 MED ORDER — ALPRAZOLAM 0.5 MG PO TABS: 0.5000 mg | ORAL_TABLET | Freq: Every day | ORAL | 0 refills | Status: DC | PRN

## 2023-08-06 NOTE — Patient Instructions (Addendum)
Stay active  Add some strength training to your routine, this is important for bone and brain health and can reduce your risk of falls and help your body use insulin properly and regulate weight  Light weights, exercise bands , and internet videos are a good way to start  Yoga (chair or regular), machines , floor exercises or a gym with machines are also good options    Go up on buspar to 15 mg twice daily  If side effects or if not feeling better let us know   Follow up in 6-8 weeks for mood

## 2023-08-06 NOTE — Assessment & Plan Note (Signed)
Dexa reviewed 11/2022 On drug holiday from alendronate  No falls or fracture  Discussed fall prevention, supplements and exercise for bone density

## 2023-08-06 NOTE — Assessment & Plan Note (Signed)
Disc goals for lipids and reasons to control them Rev last labs with pt Rev low sat fat diet in detail Continues crestor 10 mg daily  LDL of 56

## 2023-08-06 NOTE — Progress Notes (Signed)
Subjective:    Patient ID: Anita Buckley, female    DOB: 10/16/1950, 72 y.o.   MRN: [id number]  HPI  Here for annual follow up of chronic medical problems     Wt Readings from Last 3 Encounters:  08/06/23 148 lb (67.1 kg)  04/14/23 153 lb 1.6 oz (69.4 kg)  03/18/23 155 lb (70.3 kg)   24.82 kg/m  Vitals:   08/06/23 1053  BP: 126/74  Pulse: 87  Temp: 97.8 F (36.6 C)  SpO2: 98%    Immunization History  Administered Date(s) Administered   Influenza Whole 05/08/2008, 06/17/2009   Influenza, High Dose Seasonal PF 05/28/2021, 05/29/2021, 05/25/2022   Influenza-Unspecified 06/01/2015, 06/03/2017, 07/04/2018, 06/21/2019, 05/20/2020   PFIZER Comirnaty(Gray Top)Covid-19 Tri-Sucrose Vaccine 11/26/2020, 05/16/2022   PFIZER(Purple Top)SARS-COV-2 Vaccination 08/22/2019, 09/12/2019, 03/31/2020   Pfizer Covid-19 Vaccine Bivalent Booster 88yrs & up 05/09/2021, 12/20/2021   Pneumococcal Conjugate-13 03/01/2018   Pneumococcal Polysaccharide-23 04/28/2019   Respiratory Syncytial Virus Vaccine,Recomb Aduvanted(Arexvy) 07/31/2022   Td 08/26/2000, 08/18/2003   Tdap 01/09/2014   Zoster Recombinant(Shingrix) 06/21/2019, 10/23/2019   Zoster, Live 06/01/2007    Health Maintenance Due  Topic Date Due   Hepatitis C Screening  Never done   Hep c screen  Low risk  Wants to wait   Mammogram 04/2023  Self breast exam- no lumps   Gyn health No problems    Colon cancer screening  Colonoscopy 04/2022 with 10 y recall    Bone health  Dexa 11/2022 osteopenia  On drug holiday from alendronate  Falls-none Fractures-none  Supplements -vit d Last vitamin D Lab Results  Component Value Date   VD25OH 54.74 02/07/2016    Exercise :  Hard to fit it in when she can (husb in hospice)  Some stretching  Occational     Mood    08/06/2023   10:56 AM 03/18/2023   10:12 AM 04/28/2022    3:29 PM 02/10/2022   11:13 AM 01/31/2021    2:02 PM  Depression screen PHQ 2/9  Decreased  Interest 2 0 0 0 0  Down, Depressed, Hopeless 1 0 0 0 0  PHQ - 2 Score 3 0 0 0 0  Altered sleeping 3  3  0  Tired, decreased energy 2  1  0  Change in appetite 0  0  0  Feeling bad or failure about yourself  0  0  0  Trouble concentrating 0  3  0  Moving slowly or fidgety/restless 0  1  0  Suicidal thoughts 0  0  0  PHQ-9 Score 8  8  0  Difficult doing work/chores Somewhat difficult  Somewhat difficult  Not difficult at all  Really struggling (husband hospice with parkinsons) Sleep deprivation -so has night nurse now and some day time care givers also  Feels a bit better  Went to [facility] for [facility] therapy  GAD Buspar 7.5 mg bid - ? If no longer helping  Prn xanax - has to use for sleep more and it wears off  Asked about klonopin       08/06/2023   10:57 AM 04/28/2022    3:30 PM  GAD 7 : Generalized Anxiety Score  Nervous, Anxious, on Edge 1 3  Control/stop worrying 0 2  Worry too much - different things 0 2  Trouble relaxing 0 2  Restless 0 1  Easily annoyed or irritable 0 0  Afraid - awful might happen 0 2  Total GAD 7  Score 1 12  Anxiety Difficulty Somewhat difficult Very difficult      HTN bp is stable today  No cp or palpitations or headaches or edema  No side effects to medicines  BP Readings from Last 3 Encounters:  08/06/23 126/74  04/21/23 127/79  04/14/23 129/83     Amlodipine 5 mg daily   Lab Results  Component Value Date   NA 142 07/30/2023   K 4.0 07/30/2023   CO2 30 07/30/2023   GLUCOSE 91 07/30/2023   BUN 19 07/30/2023   CREATININE 0.74 07/30/2023   CALCIUM 9.1 07/30/2023   GFR 81.78 07/30/2023   GFRNONAA >60 04/13/2022    Subclinical hypothyroid Lab Results  Component Value Date   TSH 3.19 07/30/2023   FT4 normal at 0.69  B12 def Lab Results  Component Value Date   VITAMINB12 473 07/30/2023  Getting monthly shots   CLL Being monitored  Lab Results  Component Value Date   WBC 13.5 (H) 07/30/2023   HGB  13.2 07/30/2023   HCT 40.1 07/30/2023   MCV 91.0 07/30/2023   PLT 155.0 07/30/2023   Prediabetes Lab Results  Component Value Date   HGBA1C 5.9 07/30/2023   Hyperlipidemia Lab Results  Component Value Date   CHOL 130 07/30/2023   CHOL 143 07/23/2022   CHOL 164 07/30/2021   Lab Results  Component Value Date   HDL 54.80 07/30/2023   HDL 64.30 07/23/2022   HDL 65.70 07/30/2021   Lab Results  Component Value Date   LDLCALC 56 07/30/2023   LDLCALC 63 07/23/2022   LDLCALC 78 07/30/2021   Lab Results  Component Value Date   TRIG 92.0 07/30/2023   TRIG 77.0 07/23/2022   TRIG 98.0 07/30/2021   Lab Results  Component Value Date   CHOLHDL 2 07/30/2023   CHOLHDL 2 07/23/2022   CHOLHDL 2 07/30/2021   Lab Results  Component Value Date   LDLDIRECT 126.4 06/28/2012   LDLDIRECT 143.2 08/15/2008   LDLDIRECT 141.2 05/10/2008   Crestor 10 mg daily   Tries to eat healthy  Has a food service to help out    Lab Results  Component Value Date   ALT 19 07/30/2023   AST 19 07/30/2023   ALKPHOS 51 07/30/2023   BILITOT 0.8 07/30/2023         Patient Active Problem List   Diagnosis Date Noted   B12 deficiency 04/16/2023   Trochanteric bursitis of both hips 01/01/2023   Right knee pain 01/01/2023   Flat feet, bilateral 01/01/2023   Subclinical hypothyroidism 07/31/2022   Insomnia 07/30/2021   Thyroid nodule 10/12/2019   Glaucoma (increased eye pressure) 07/04/2017   Chronic lymphocytic leukemia (CLL), B-cell (HCC) 02/11/2016   Colon cancer screening 01/25/2015   Estrogen deficiency 01/25/2015   Lymphoma (HCC) 06/04/2014   Osteopenia 01/06/2013   HTN (hypertension), benign 06/28/2012   Other screening mammogram 01/05/2012   Post-menopause 01/05/2012   Routine general medical examination at a health care facility 12/31/2011   Pure hypercholesterolemia 08/15/2008   GAD (generalized anxiety disorder) 12/08/2007   FIBROCYSTIC BREAST DISEASE 03/30/2007   Prediabetes  03/30/2007   Past Medical History:  Diagnosis Date   Anxiety    Cancer (HCC)    B CELL LYMPHOMA   Chronic lymphocytic leukemia (CLL), B-cell (HCC) 02/11/2016   Depression    History of anxiety    History of depression    History of fibrocystic disease of breast    History of hyperlipidemia  Hypertension    Situational stress    Past Surgical History:  Procedure Laterality Date   COLONOSCOPY  12/2005   polyps, tics   COLONOSCOPY WITH PROPOFOL N/A 05/11/2016   Procedure: COLONOSCOPY WITH PROPOFOL;  Surgeon: Scot Jun, MD;  Location: [facility] ENDOSCOPY;  Service: Endoscopy;  Laterality: N/A;   THYROIDECTOMY Left 08/20/2015   Procedure: THYROIDECTOMY/ HEMITHYROIDECTOMY;  Surgeon: Linus Salmons, MD;  Location: [facility] ORS;  Service: ENT;  Laterality: Left;   TUBAL LIGATION     Social History   Tobacco Use   Smoking status: Never   Smokeless tobacco: Never  Vaping Use   Vaping status: Never Used  Substance Use Topics   Alcohol use: Yes    Alcohol/week: 5.0 - 10.0 standard drinks of alcohol    Types: 5 - 10 Glasses of wine per week    Comment: occ (wine with dinner)   Drug use: No   Family History  Problem Relation Age of Onset   Diabetes Father        late in life   Alcohol abuse Father    Hyperlipidemia Mother    Mental illness Mother    Depression Sister    Leukemia Brother    Bladder Cancer Brother    Schizophrenia Other        Grandfather   Breast cancer Neg Hx    Allergies  Allergen Reactions   Bee Venom     Anaphylaxis    Minocycline Nausea And Vomiting   Penicillins     REACTION: reaction as a child   Current Outpatient Medications on File Prior to Visit  Medication Sig Dispense Refill   amLODipine (NORVASC) 5 MG tablet TAKE 1 TABLET BY MOUTH DAILY 90 tablet 0   EPINEPHrine 0.3 mg/0.3 mL IJ SOAJ injection INJECT 0.3ML INTO THE MUSCLE AS NEEDED 2 each 1   latanoprost (XALATAN) 0.005 % ophthalmic solution Place 1 drop into both eyes at bedtime.      rosuvastatin (CRESTOR) 10 MG tablet TAKE 1 TABLET BY MOUTH DAILY 90 tablet 2   timolol (TIMOPTIC) 0.5 % ophthalmic solution 1 drop daily.     No current facility-administered medications on file prior to visit.    Review of Systems  Constitutional:  Negative for activity change, appetite change, fatigue, fever and unexpected weight change.  HENT:  Negative for congestion, ear pain, rhinorrhea, sinus pressure and sore throat.   Eyes:  Negative for pain, redness and visual disturbance.  Respiratory:  Negative for cough, shortness of breath and wheezing.   Cardiovascular:  Negative for chest pain and palpitations.  Gastrointestinal:  Negative for abdominal pain, blood in stool, constipation and diarrhea.  Endocrine: Negative for polydipsia and polyuria.  Genitourinary:  Negative for dysuria, frequency and urgency.  Musculoskeletal:  Negative for arthralgias, back pain and myalgias.  Skin:  Negative for pallor and rash.  Allergic/Immunologic: Negative for environmental allergies.  Neurological:  Negative for dizziness, syncope and headaches.  Hematological:  Negative for adenopathy. Does not bruise/bleed easily.  Psychiatric/Behavioral:  Positive for decreased concentration, dysphoric mood and sleep disturbance. Negative for confusion and suicidal ideas. The patient is nervous/anxious.        Objective:   Physical Exam Constitutional:      General: She is not in acute distress.    Appearance: Normal appearance. She is well-developed and normal weight. She is not ill-appearing or diaphoretic.  HENT:     Head: Normocephalic and atraumatic.     Right Ear: Tympanic membrane, ear canal  and external ear normal.     Left Ear: Tympanic membrane, ear canal and external ear normal.     Nose: Nose normal. No congestion.     Mouth/Throat:     Mouth: Mucous membranes are moist.     Pharynx: Oropharynx is clear. No posterior oropharyngeal erythema.  Eyes:     General: No scleral icterus.     Extraocular Movements: Extraocular movements intact.     Conjunctiva/sclera: Conjunctivae normal.     Pupils: Pupils are equal, round, and reactive to light.  Neck:     Thyroid: No thyromegaly.     Vascular: No carotid bruit or JVD.  Cardiovascular:     Rate and Rhythm: Normal rate and regular rhythm.     Pulses: Normal pulses.     Heart sounds: Normal heart sounds.     No gallop.  Pulmonary:     Effort: Pulmonary effort is normal. No respiratory distress.     Breath sounds: Normal breath sounds. No wheezing.     Comments: Good air exch Chest:     Chest wall: No tenderness.  Abdominal:     General: Bowel sounds are normal. There is no distension or abdominal bruit.     Palpations: Abdomen is soft. There is no mass.     Tenderness: There is no abdominal tenderness.     Hernia: No hernia is present.  Genitourinary:    Comments: Breast exam: No mass, nodules, thickening, tenderness, bulging, retraction, inflamation, nipple discharge or skin changes noted.  No axillary or clavicular LA.     Musculoskeletal:        General: No tenderness. Normal range of motion.     Cervical back: Normal range of motion and neck supple. No rigidity. No muscular tenderness.     Right lower leg: No edema.     Left lower leg: No edema.     Comments: No kyphosis   Lymphadenopathy:     Cervical: No cervical adenopathy.  Skin:    General: Skin is warm and dry.     Coloration: Skin is not pale.     Findings: No erythema or rash.     Comments: Solar lentigines diffusely   Neurological:     Mental Status: She is alert. Mental status is at baseline.     Cranial Nerves: No cranial nerve deficit.     Motor: No abnormal muscle tone.     Coordination: Coordination normal.     Gait: Gait normal.     Deep Tendon Reflexes: Reflexes are normal and symmetric. Reflexes normal.  Psychiatric:        Attention and Perception: Attention normal.        Mood and Affect: Mood is anxious. Affect is not tearful.         Speech: Speech normal.        Behavior: Behavior normal.        Cognition and Memory: Cognition and memory normal.     Comments: Candidly discusses symptoms and stressors             Assessment & Plan:   Problem List Items Addressed This Visit       Cardiovascular and Mediastinum   HTN (hypertension), benign   bp in fair control at this time  BP Readings from Last 1 Encounters:  08/06/23 126/74   No changes needed Most recent labs reviewed  Disc lifstyle change with low sodium diet and exercise  Plan to continue amlodipine 5 mg daily  Endocrine   Subclinical hypothyroidism   Lab Results  Component Value Date   TSH 3.19 07/30/2023   FT4 in normal range Will continue to monitor  No change in exam         Musculoskeletal and Integument   Osteopenia   Dexa reviewed 11/2022 On drug holiday from alendronate  No falls or fracture  Discussed fall prevention, supplements and exercise for bone density          Other   Pure hypercholesterolemia   Disc goals for lipids and reasons to control them Rev last labs with pt Rev low sat fat diet in detail Continues crestor 10 mg daily  LDL of 56       Prediabetes   Stable Lab Results  Component Value Date   HGBA1C 5.9 07/30/2023   Is mindful about diet disc imp of low glycemic diet and wt loss to prevent DM2       GAD (generalized anxiety disorder) - Primary   This is worse in setting of husband with parkinsons in hospice  PHQ 8 Gad 2  Seeing counselor  Good insight  Interested in increase therapy  Will increase buspar to 15 mg bid with opt later to try tid  Xanas as needed  If not improved can try different ssri (not lexapro)  Encouraged self care and continued counseling  Call back and Er precautions noted in detail today         Relevant Medications   busPIRone (BUSPAR) 15 MG tablet   ALPRAZolam (XANAX) 0.5 MG tablet   Colon cancer screening   Colonoscopy 04/2022 with 10 y recall       Chronic lymphocytic leukemia (CLL), B-cell (HCC)   Being monitored Stable  This wbc 13.5      Relevant Medications   ALPRAZolam (XANAX) 0.5 MG tablet   B12 deficiency   Getting monthly shots here now  Was dx at hematology Lab Results  Component Value Date   VITAMINB12 473 07/30/2023

## 2023-08-06 NOTE — Assessment & Plan Note (Signed)
Stable Lab Results  Component Value Date   HGBA1C 5.9 07/30/2023   Is mindful about diet disc imp of low glycemic diet and wt loss to prevent DM2

## 2023-08-06 NOTE — Assessment & Plan Note (Signed)
Being monitored Stable  This wbc 13.5

## 2023-08-06 NOTE — Assessment & Plan Note (Signed)
Lab Results  Component Value Date   TSH 3.19 07/30/2023   FT4 in normal range Will continue to monitor  No change in exam

## 2023-08-06 NOTE — Assessment & Plan Note (Signed)
This is worse in setting of husband with parkinsons in hospice  PHQ 8 Gad 2  Seeing counselor  Good insight  Interested in increase therapy  Will increase buspar to 15 mg bid with opt later to try tid  Xanas as needed  If not improved can try different ssri (not lexapro)  Encouraged self care and continued counseling  Call back and Er precautions noted in detail today

## 2023-08-06 NOTE — Assessment & Plan Note (Signed)
Colonoscopy 04/2022 with 10 y recall

## 2023-08-06 NOTE — Assessment & Plan Note (Signed)
bp in fair control at this time  BP Readings from Last 1 Encounters:  08/06/23 126/74   No changes needed Most recent labs reviewed  Disc lifstyle change with low sodium diet and exercise  Plan to continue amlodipine 5 mg daily

## 2023-08-06 NOTE — Assessment & Plan Note (Signed)
Getting monthly shots here now  Was dx at hematology Lab Results  Component Value Date   VITAMINB12 473 07/30/2023

## 2023-08-25 ENCOUNTER — Ambulatory Visit: Payer: Medicare Other

## 2023-08-25 DIAGNOSIS — E538 Deficiency of other specified B group vitamins: Secondary | ICD-10-CM | POA: Diagnosis not present

## 2023-08-25 MED ORDER — CYANOCOBALAMIN 1000 MCG/ML IJ SOLN
1000.0000 ug | Freq: Once | INTRAMUSCULAR | Status: AC
Start: 2023-08-25 — End: 2023-08-25
  Administered 2023-08-25: 1000 ug via INTRAMUSCULAR

## 2023-08-25 NOTE — Progress Notes (Signed)
 Per orders of Dr. Roxy Manns, injection of vitamin b 12 given by Lewanda Rife in left deltoid. Patient tolerated injection well. Patient will make appointment for 1 month.

## 2023-09-06 ENCOUNTER — Encounter: Payer: Self-pay | Admitting: Family Medicine

## 2023-09-07 MED ORDER — MELOXICAM 15 MG PO TABS: 15.0000 mg | ORAL_TABLET | Freq: Every day | ORAL | 1 refills | Status: DC

## 2023-09-27 ENCOUNTER — Telehealth: Payer: Self-pay

## 2023-09-27 NOTE — Telephone Encounter (Signed)
 Spoke with pt about her orthotics, hurting her heel. Made an appt for an adjustment

## 2023-09-28 ENCOUNTER — Ambulatory Visit: Payer: Medicare Other

## 2023-09-29 ENCOUNTER — Ambulatory Visit (INDEPENDENT_AMBULATORY_CARE_PROVIDER_SITE_OTHER): Payer: Medicare Other

## 2023-09-29 DIAGNOSIS — E538 Deficiency of other specified B group vitamins: Secondary | ICD-10-CM | POA: Diagnosis not present

## 2023-09-29 MED ORDER — CYANOCOBALAMIN 1000 MCG/ML IJ SOLN
1000.0000 ug | Freq: Once | INTRAMUSCULAR | Status: AC
Start: 2023-09-29 — End: 2023-09-29
  Administered 2023-09-29: 1000 ug via INTRAMUSCULAR

## 2023-09-29 NOTE — Progress Notes (Signed)
Per orders of Dr. Roxy Manns, injection of B-12 given by Leonor Liv in right deltoid. Patient tolerated injection well. Patient will make appointment for 1 month.

## 2023-10-01 ENCOUNTER — Ambulatory Visit: Payer: Medicare Other | Admitting: Family Medicine

## 2023-10-01 ENCOUNTER — Encounter: Payer: Self-pay | Admitting: Family Medicine

## 2023-10-01 VITALS — BP 118/70 | HR 76 | Temp 98.4°F | Ht 64.75 in | Wt 153.0 lb

## 2023-10-01 DIAGNOSIS — F411 Generalized anxiety disorder: Secondary | ICD-10-CM | POA: Diagnosis not present

## 2023-10-01 MED ORDER — ALPRAZOLAM 0.5 MG PO TABS: 0.5000 mg | ORAL_TABLET | Freq: Every day | ORAL | 0 refills | Status: DC | PRN

## 2023-10-01 NOTE — Assessment & Plan Note (Signed)
Pt thinks she is making progress Feels ready to move on from counseling  Still very stressful situation at home caring for husband who is in hospice care  Reviewed stressors/ coping techniques/symptoms/ support sources/ tx options and side effects in detail today Wants to continue buspar 15 mg bid (caution for dizziness) Xanax at bedtime if abs necessary  In future would like to change to lower risk medicine but for now will continue  Good insight Plans to start exercise program Suspect this will be great for her   Call back and Er precautions noted in detail today

## 2023-10-01 NOTE — Progress Notes (Signed)
Subjective:    Patient ID: Anita Buckley, female    DOB: 02/27/1951, 73 y.o.   MRN: [id number]  HPI  Wt Readings from Last 3 Encounters:  10/01/23 153 lb (69.4 kg)  08/06/23 148 lb (67.1 kg)  04/14/23 153 lb 1.6 oz (69.4 kg)   25.66 kg/m  Vitals:   10/01/23 1059  BP: 118/70  Pulse: 76  Temp: 98.4 F (36.9 C)  SpO2: 97%    Pt presents for follow up of mood/ GAD  From last visit in December  This is worse in setting of husband with parkinsons in hospice  PHQ 8 Gad 2   Seeing counselor  Good insight  Interested in increase therapy  Will increase buspar to 15 mg bid with opt later to try tid   Xanas as needed  If not improved can try different ssri (not lexapro)  In the past lexapro caused panic attacks    In general - (stress worse since December)  Has left counseling-thinks she got all she needed to out of it (just on pause)    Buspar helps with the anxiety (has to be careful because she gets dizzy for first 10-15 min after taking)  Sleep - gets 7 hours with xanax (usually half a pill)    Joining a gym (fitness together program with trainers)  Really excited to start that   New caretakers are starting soon to free her up    Husb-parkinsons  Bed ridden  Ups and downs  Some agitation  Is a roller coaster Hospice is coming it       10/01/2023   11:05 AM 08/06/2023   10:56 AM 03/18/2023   10:12 AM 04/28/2022    3:29 PM 02/10/2022   11:13 AM  Depression screen PHQ 2/9  Decreased Interest 1 2 0 0 0  Down, Depressed, Hopeless 1 1 0 0 0  PHQ - 2 Score 2 3 0 0 0  Altered sleeping 2 3  3    Tired, decreased energy 3 2  1    Change in appetite 0 0  0   Feeling bad or failure about yourself  0 0  0   Trouble concentrating 1 0  3   Moving slowly or fidgety/restless 0 0  1   Suicidal thoughts 0 0  0   PHQ-9 Score 8 8  8    Difficult doing work/chores Somewhat difficult Somewhat difficult  Somewhat difficult       10/01/2023   11:05 AM 08/06/2023    10:57 AM 04/28/2022    3:30 PM  GAD 7 : Generalized Anxiety Score  Nervous, Anxious, on Edge 1 1 3   Control/stop worrying 1 0 2  Worry too much - different things 1 0 2  Trouble relaxing 1 0 2  Restless 0 0 1  Easily annoyed or irritable 0 0 0  Afraid - awful might happen 0 0 2  Total GAD 7 Score 4 1 12   Anxiety Difficulty Somewhat difficult Somewhat difficult Very difficult        Patient Active Problem List   Diagnosis Date Noted   B12 deficiency 04/16/2023   Trochanteric bursitis of both hips 01/01/2023   Right knee pain 01/01/2023   Flat feet, bilateral 01/01/2023   Subclinical hypothyroidism 07/31/2022   Insomnia 07/30/2021   Thyroid nodule 10/12/2019   Glaucoma (increased eye pressure) 07/04/2017   Chronic lymphocytic leukemia (CLL), B-cell (HCC) 02/11/2016   Colon cancer screening 01/25/2015   Estrogen deficiency 01/25/2015  Lymphoma (HCC) 06/04/2014   Osteopenia 01/06/2013   HTN (hypertension), benign 06/28/2012   Other screening mammogram 01/05/2012   Post-menopause 01/05/2012   Routine general medical examination at a health care facility 12/31/2011   Pure hypercholesterolemia 08/15/2008   GAD (generalized anxiety disorder) 12/08/2007   FIBROCYSTIC BREAST DISEASE 03/30/2007   Prediabetes 03/30/2007   Past Medical History:  Diagnosis Date   Anxiety    Cancer (HCC)    B CELL LYMPHOMA   Chronic lymphocytic leukemia (CLL), B-cell (HCC) 02/11/2016   Depression    History of anxiety    History of depression    History of fibrocystic disease of breast    History of hyperlipidemia    Hypertension    Situational stress    Past Surgical History:  Procedure Laterality Date   COLONOSCOPY  12/2005   polyps, tics   COLONOSCOPY WITH PROPOFOL N/A 05/11/2016   Procedure: COLONOSCOPY WITH PROPOFOL;  Surgeon: Scot Jun, MD;  Location: [facility] ENDOSCOPY;  Service: Endoscopy;  Laterality: N/A;   THYROIDECTOMY Left 08/20/2015   Procedure: THYROIDECTOMY/  HEMITHYROIDECTOMY;  Surgeon: Linus Salmons, MD;  Location: [facility] ORS;  Service: ENT;  Laterality: Left;   TUBAL LIGATION     Social History   Tobacco Use   Smoking status: Never   Smokeless tobacco: Never  Vaping Use   Vaping status: Never Used  Substance Use Topics   Alcohol use: Yes    Alcohol/week: 5.0 - 10.0 standard drinks of alcohol    Types: 5 - 10 Glasses of wine per week    Comment: occ (wine with dinner)   Drug use: No   Family History  Problem Relation Age of Onset   Diabetes Father        late in life   Alcohol abuse Father    Hyperlipidemia Mother    Mental illness Mother    Depression Sister    Leukemia Brother    Bladder Cancer Brother    Schizophrenia Other        Grandfather   Breast cancer Neg Hx    Allergies  Allergen Reactions   Bee Venom     Anaphylaxis    Minocycline Nausea And Vomiting   Penicillins     REACTION: reaction as a child   Current Outpatient Medications on File Prior to Visit  Medication Sig Dispense Refill   amLODipine (NORVASC) 5 MG tablet TAKE 1 TABLET BY MOUTH DAILY 90 tablet 0   busPIRone (BUSPAR) 15 MG tablet Take 1 tablet (15 mg total) by mouth 2 (two) times daily. 180 tablet 1   EPINEPHrine 0.3 mg/0.3 mL IJ SOAJ injection INJECT 0.3ML INTO THE MUSCLE AS NEEDED 2 each 1   latanoprost (XALATAN) 0.005 % ophthalmic solution Place 1 drop into both eyes at bedtime.     meloxicam (MOBIC) 15 MG tablet Take 1 tablet (15 mg total) by mouth daily. (Patient taking differently: Take 15 mg by mouth daily as needed.) 30 tablet 1   rosuvastatin (CRESTOR) 10 MG tablet TAKE 1 TABLET BY MOUTH DAILY 90 tablet 2   timolol (TIMOPTIC) 0.5 % ophthalmic solution 1 drop daily.     No current facility-administered medications on file prior to visit.    Review of Systems  Constitutional:  Positive for fatigue.  Psychiatric/Behavioral:  Positive for dysphoric mood and sleep disturbance. Negative for self-injury and suicidal ideas. The patient is  nervous/anxious.        Objective:   Physical Exam Constitutional:  General: She is not in acute distress.    Appearance: Normal appearance. She is normal weight. She is not ill-appearing.  Cardiovascular:     Pulses: Normal pulses.     Heart sounds: Normal heart sounds.  Pulmonary:     Effort: Pulmonary effort is normal. No respiratory distress.  Musculoskeletal:     Cervical back: Neck supple.  Skin:    General: Skin is warm and dry.  Neurological:     Mental Status: She is alert.     Motor: Motor function is intact. No tremor.     Coordination: Coordination is intact.  Psychiatric:     Comments: Pleasant Talkative Good insight  Candidly discusses symptoms and stressors             Assessment & Plan:   Problem List Items Addressed This Visit       Other   GAD (generalized anxiety disorder) - Primary   Pt thinks she is making progress Feels ready to move on from counseling  Still very stressful situation at home caring for husband who is in hospice care  Reviewed stressors/ coping techniques/symptoms/ support sources/ tx options and side effects in detail today Wants to continue buspar 15 mg bid (caution for dizziness) Xanax at bedtime if abs necessary  In future would like to change to lower risk medicine but for now will continue  Good insight Plans to start exercise program Suspect this will be great for her   Call back and Er precautions noted in detail today        Relevant Medications   ALPRAZolam (XANAX) 0.5 MG tablet

## 2023-10-01 NOTE — Patient Instructions (Addendum)
Use caution with xanax  Minimize it if you can  This will not be for the long term  Is ok for now  Lock it up in your house   Continue current medicines  Try taking buspar with snack or meal- that may help the dizziness  Go back to counseling if needed   Start with exercise as planned

## 2023-10-21 ENCOUNTER — Ambulatory Visit: Admitting: Podiatry

## 2023-10-21 ENCOUNTER — Ambulatory Visit (INDEPENDENT_AMBULATORY_CARE_PROVIDER_SITE_OTHER): Admitting: Podiatry

## 2023-10-21 DIAGNOSIS — M722 Plantar fascial fibromatosis: Secondary | ICD-10-CM

## 2023-10-21 NOTE — Progress Notes (Signed)
 Subjective:  Patient ID: Anita Buckley, female    DOB: 1950/08/31,  MRN: [id number]  Chief Complaint  Patient presents with   Foot Pain    Pt stated that she got new orthotics and has been wearing them and she went walking and now is having some discomfort with her heel she stated that she has an appointment with Nicki Guadalajara to get her orthotics adjusted but wanted someone to look at her foot    73 y.o. female presents with the above complaint.  Patient presents with left heel pain that has been there for quite some time is progressive gotten worse worse with ambulation worse with pressure.  She has gotten orthotics from Dr. Kyra Manges that may have aggravated it pain scale 7 out of 10 dull achy in nature   Review of Systems: Negative except as noted in the HPI. Denies N/V/F/Ch.  Past Medical History:  Diagnosis Date   Anxiety    Cancer (HCC)    B CELL LYMPHOMA   Chronic lymphocytic leukemia (CLL), B-cell (HCC) 02/11/2016   Depression    History of anxiety    History of depression    History of fibrocystic disease of breast    History of hyperlipidemia    Hypertension    Situational stress     Current Outpatient Medications:    ALPRAZolam (XANAX) 0.5 MG tablet, Take 1 tablet (0.5 mg total) by mouth daily as needed for anxiety or sleep., Disp: 30 tablet, Rfl: 0   amLODipine (NORVASC) 5 MG tablet, TAKE 1 TABLET BY MOUTH DAILY, Disp: 90 tablet, Rfl: 0   busPIRone (BUSPAR) 15 MG tablet, Take 1 tablet (15 mg total) by mouth 2 (two) times daily., Disp: 180 tablet, Rfl: 1   EPINEPHrine 0.3 mg/0.3 mL IJ SOAJ injection, INJECT 0.3ML INTO THE MUSCLE AS NEEDED, Disp: 2 each, Rfl: 1   latanoprost (XALATAN) 0.005 % ophthalmic solution, Place 1 drop into both eyes at bedtime., Disp: , Rfl:    meloxicam (MOBIC) 15 MG tablet, Take 1 tablet (15 mg total) by mouth daily. (Patient taking differently: Take 15 mg by mouth daily as needed.), Disp: 30 tablet, Rfl: 1   rosuvastatin (CRESTOR) 10 MG tablet,  TAKE 1 TABLET BY MOUTH DAILY, Disp: 90 tablet, Rfl: 2   timolol (TIMOPTIC) 0.5 % ophthalmic solution, 1 drop daily., Disp: , Rfl:   Social History   Tobacco Use  Smoking Status Never  Smokeless Tobacco Never    Allergies  Allergen Reactions   Bee Venom     Anaphylaxis    Minocycline Nausea And Vomiting   Penicillins     REACTION: reaction as a child   Objective:  There were no vitals filed for this visit. There is no height or weight on file to calculate BMI. Constitutional Well developed. Well nourished.  Vascular Dorsalis pedis pulses palpable bilaterally. Posterior tibial pulses palpable bilaterally. Capillary refill normal to all digits.  No cyanosis or clubbing noted. Pedal hair growth normal.  Neurologic Normal speech. Oriented to person, place, and time. Epicritic sensation to light touch grossly present bilaterally.  Dermatologic Nails well groomed and normal in appearance. No open wounds. No skin lesions.  Orthopedic: Normal joint ROM without pain or crepitus bilaterally. No visible deformities. Tender to palpation at the calcaneal tuber left. No pain with calcaneal squeeze left. Ankle ROM diminished range of motion left. Silfverskiold Test: positive left.   Radiographs: None  Assessment:   1. Plantar fasciitis of left foot    Plan:  Patient was evaluated and treated and all questions answered.  Plantar Fasciitis, left - XR reviewed as above.  - Educated on icing and stretching. Instructions given.  - Injection delivered to the plantar fascia as below. - DME: Plantar fascial brace dispensed to support the medial longitudinal arch of the foot and offload pressure from the heel and prevent arch collapse during weightbearing - Pharmacologic management: None  Procedure: Injection Tendon/Ligament Location: Left plantar fascia at the glabrous junction; medial approach. Skin Prep: alcohol Injectate: 0.5 cc 0.5% marcaine plain, 0.5 cc of 1% Lidocaine, 0.5  cc kenalog 10. Disposition: Patient tolerated procedure well. Injection site dressed with a band-aid.  No follow-ups on file.

## 2023-10-23 ENCOUNTER — Other Ambulatory Visit: Payer: Self-pay | Admitting: Family Medicine

## 2023-11-03 ENCOUNTER — Ambulatory Visit (INDEPENDENT_AMBULATORY_CARE_PROVIDER_SITE_OTHER): Payer: Medicare Other

## 2023-11-03 DIAGNOSIS — E538 Deficiency of other specified B group vitamins: Secondary | ICD-10-CM

## 2023-11-03 MED ORDER — CYANOCOBALAMIN 1000 MCG/ML IJ SOLN
1000.0000 ug | Freq: Once | INTRAMUSCULAR | Status: AC
Start: 2023-11-03 — End: 2023-11-03
  Administered 2023-11-03: 1000 ug via INTRAMUSCULAR

## 2023-11-03 NOTE — Progress Notes (Signed)
 Per orders of Dr. Milinda Antis, injection of monthly B12 1000 mcg/ml given by Eual Fines, CMA in Left Deltoid. Patient tolerated injection well.

## 2023-11-08 ENCOUNTER — Other Ambulatory Visit: Payer: Self-pay | Admitting: Family Medicine

## 2023-11-08 NOTE — Telephone Encounter (Signed)
 Name of Medication: Xanax Name of Pharmacy: [facility]  Last Fill or Written Date and Quantity: 10/01/23 #30 tabs/ 0 refill Last Office Visit and Type: OV 10/01/23 Next Office Visit and Type: 08/07/2024

## 2023-11-10 ENCOUNTER — Telehealth: Payer: Self-pay

## 2023-11-10 NOTE — Telephone Encounter (Signed)
 Spoke with PT, she stated she wanted a refund on orthotics its been 2 months and they still dont work, I called to reschedule appt.

## 2023-11-18 ENCOUNTER — Ambulatory Visit (INDEPENDENT_AMBULATORY_CARE_PROVIDER_SITE_OTHER): Admitting: Podiatry

## 2023-11-18 DIAGNOSIS — M722 Plantar fascial fibromatosis: Secondary | ICD-10-CM | POA: Diagnosis not present

## 2023-11-18 DIAGNOSIS — Q666 Other congenital valgus deformities of feet: Secondary | ICD-10-CM

## 2023-11-18 NOTE — Progress Notes (Signed)
 Subjective:  Patient ID: Anita Buckley, female    DOB: 1951-05-11,  MRN: [id number]  Chief Complaint  Patient presents with   Plantar Fasciitis    73 y.o. female presents with the above complaint.  Patient presents with left heel pain that has been there for quite some time is progressive gotten worse worse with ambulation worse with pressure.  She has gotten orthotics from Dr. Kyra Manges that may have aggravated it pain scale 7 out of 10 dull achy in nature   Review of Systems: Negative except as noted in the HPI. Denies N/V/F/Ch.  Past Medical History:  Diagnosis Date   Anxiety    Cancer (HCC)    B CELL LYMPHOMA   Chronic lymphocytic leukemia (CLL), B-cell (HCC) 02/11/2016   Depression    History of anxiety    History of depression    History of fibrocystic disease of breast    History of hyperlipidemia    Hypertension    Situational stress     Current Outpatient Medications:    ALPRAZolam (XANAX) 0.5 MG tablet, TAKE ONE TABLET BY MOUTH DAILY AS NEEDEDFOR ANXIETY OR SLEEP, Disp: 30 tablet, Rfl: 2   amLODipine (NORVASC) 5 MG tablet, TAKE 1 TABLET BY MOUTH DAILY, Disp: 90 tablet, Rfl: 2   busPIRone (BUSPAR) 15 MG tablet, Take 1 tablet (15 mg total) by mouth 2 (two) times daily., Disp: 180 tablet, Rfl: 1   EPINEPHrine 0.3 mg/0.3 mL IJ SOAJ injection, INJECT 0.3ML INTO THE MUSCLE AS NEEDED, Disp: 2 each, Rfl: 1   latanoprost (XALATAN) 0.005 % ophthalmic solution, Place 1 drop into both eyes at bedtime., Disp: , Rfl:    meloxicam (MOBIC) 15 MG tablet, Take 1 tablet (15 mg total) by mouth daily. (Patient taking differently: Take 15 mg by mouth daily as needed.), Disp: 30 tablet, Rfl: 1   rosuvastatin (CRESTOR) 10 MG tablet, TAKE 1 TABLET BY MOUTH DAILY, Disp: 90 tablet, Rfl: 2   timolol (TIMOPTIC) 0.5 % ophthalmic solution, 1 drop daily., Disp: , Rfl:   Social History   Tobacco Use  Smoking Status Never  Smokeless Tobacco Never    Allergies  Allergen Reactions   Bee  Venom     Anaphylaxis    Minocycline Nausea And Vomiting   Penicillins     REACTION: reaction as a child   Objective:  There were no vitals filed for this visit. There is no height or weight on file to calculate BMI. Constitutional Well developed. Well nourished.  Vascular Dorsalis pedis pulses palpable bilaterally. Posterior tibial pulses palpable bilaterally. Capillary refill normal to all digits.  No cyanosis or clubbing noted. Pedal hair growth normal.  Neurologic Normal speech. Oriented to person, place, and time. Epicritic sensation to light touch grossly present bilaterally.  Dermatologic Nails well groomed and normal in appearance. No open wounds. No skin lesions.  Orthopedic: Normal joint ROM without pain or crepitus bilaterally. No visible deformities. Tender to palpation at the calcaneal tuber left. No pain with calcaneal squeeze left. Ankle ROM diminished range of motion left. Silfverskiold Test: positive left.   Radiographs: None  Assessment:   No diagnosis found.  Plan:  Patient was evaluated and treated and all questions answered.  Plantar Fasciitis, left - XR reviewed as above.  - Educated on icing and stretching. Instructions given.  -Second injection delivered to the plantar fascia as below. - DME: Plantar fascial brace dispensed to support the medial longitudinal arch of the foot and offload pressure from the heel and  prevent arch collapse during weightbearing - Pharmacologic management: None -Power steps orthotics were discussed  Procedure: Injection Tendon/Ligament Location: Left plantar fascia at the glabrous junction; medial approach. Skin Prep: alcohol Injectate: 0.5 cc 0.5% marcaine plain, 0.5 cc of 1% Lidocaine, 0.5 cc kenalog 10. Disposition: Patient tolerated procedure well. Injection site dressed with a band-aid.  No follow-ups on file.

## 2023-11-19 ENCOUNTER — Other Ambulatory Visit: Payer: Medicare Other

## 2023-11-22 DIAGNOSIS — D3131 Benign neoplasm of right choroid: Secondary | ICD-10-CM | POA: Diagnosis not present

## 2023-11-22 DIAGNOSIS — H40001 Preglaucoma, unspecified, right eye: Secondary | ICD-10-CM | POA: Diagnosis not present

## 2023-11-22 DIAGNOSIS — H2513 Age-related nuclear cataract, bilateral: Secondary | ICD-10-CM | POA: Diagnosis not present

## 2023-11-22 DIAGNOSIS — H401121 Primary open-angle glaucoma, left eye, mild stage: Secondary | ICD-10-CM | POA: Diagnosis not present

## 2023-11-29 DIAGNOSIS — Z23 Encounter for immunization: Secondary | ICD-10-CM | POA: Diagnosis not present

## 2023-12-08 ENCOUNTER — Other Ambulatory Visit: Payer: Self-pay | Admitting: Family Medicine

## 2023-12-08 ENCOUNTER — Ambulatory Visit

## 2023-12-16 ENCOUNTER — Ambulatory Visit (INDEPENDENT_AMBULATORY_CARE_PROVIDER_SITE_OTHER): Admitting: Podiatry

## 2023-12-16 DIAGNOSIS — M722 Plantar fascial fibromatosis: Secondary | ICD-10-CM

## 2023-12-16 NOTE — Progress Notes (Unsigned)
 Subjective:  Patient ID: Anita Buckley, female    DOB: 01/23/1951,  MRN: [id number]  Chief Complaint  Patient presents with   Plantar Fasciitis    Pt stated that she still has some pain she stated that it has improved some     73 y.o. female presents with the above complaint.  Patient presents with left plantar fasciitis.  She still has some residual pain.  It did improve some.  She would like to discuss next treatment plan.  Review of Systems: Negative except as noted in the HPI. Denies N/V/F/Ch.  Past Medical History:  Diagnosis Date   Anxiety    Cancer (HCC)    B CELL LYMPHOMA   Chronic lymphocytic leukemia (CLL), B-cell (HCC) 02/11/2016   Depression    History of anxiety    History of depression    History of fibrocystic disease of breast    History of hyperlipidemia    Hypertension    Situational stress     Current Outpatient Medications:    ALPRAZolam  (XANAX ) 0.5 MG tablet, TAKE ONE TABLET BY MOUTH DAILY AS NEEDEDFOR ANXIETY OR SLEEP, Disp: 30 tablet, Rfl: 2   amLODipine  (NORVASC ) 5 MG tablet, TAKE 1 TABLET BY MOUTH DAILY, Disp: 90 tablet, Rfl: 2   busPIRone  (BUSPAR ) 15 MG tablet, Take 1 tablet (15 mg total) by mouth 2 (two) times daily., Disp: 180 tablet, Rfl: 1   EPINEPHrine  0.3 mg/0.3 mL IJ SOAJ injection, INJECT 0.3ML INTO THE MUSCLE AS NEEDED, Disp: 2 each, Rfl: 1   latanoprost (XALATAN) 0.005 % ophthalmic solution, Place 1 drop into both eyes at bedtime., Disp: , Rfl:    meloxicam  (MOBIC ) 15 MG tablet, Take 1 tablet (15 mg total) by mouth daily. (Patient taking differently: Take 15 mg by mouth daily as needed.), Disp: 30 tablet, Rfl: 1   rosuvastatin  (CRESTOR ) 10 MG tablet, TAKE 1 TABLET BY MOUTH DAILY, Disp: 90 tablet, Rfl: 2   scopolamine  (TRANSDERM-SCOP) 1 MG/3DAYS, Place 1 patch (1.5 mg total) onto the skin every 3 (three) days., Disp: 8 patch, Rfl: 1   timolol (TIMOPTIC) 0.5 % ophthalmic solution, 1 drop daily., Disp: , Rfl:   Social History   Tobacco  Use  Smoking Status Never  Smokeless Tobacco Never    Allergies  Allergen Reactions   Bee Venom     Anaphylaxis    Minocycline Nausea And Vomiting   Penicillins     REACTION: reaction as a child   Objective:  There were no vitals filed for this visit. There is no height or weight on file to calculate BMI. Constitutional Well developed. Well nourished.  Vascular Dorsalis pedis pulses palpable bilaterally. Posterior tibial pulses palpable bilaterally. Capillary refill normal to all digits.  No cyanosis or clubbing noted. Pedal hair growth normal.  Neurologic Normal speech. Oriented to person, place, and time. Epicritic sensation to light touch grossly present bilaterally.  Dermatologic Nails well groomed and normal in appearance. No open wounds. No skin lesions.  Orthopedic: Normal joint ROM without pain or crepitus bilaterally. No visible deformities. Tender to palpation at the calcaneal tuber left. No pain with calcaneal squeeze left. Ankle ROM diminished range of motion left. Silfverskiold Test: positive left.   Radiographs: None  Assessment:   No diagnosis found.  Plan:  Patient was evaluated and treated and all questions answered.  Plantar Fasciitis, left - XR reviewed as above.  - Educated on icing and stretching. Instructions given.  - No further injections at this time - DME: Cam  boot immobilization - Pharmacologic management: None -Power steps orthotics were discussed   No follow-ups on file.

## 2023-12-17 ENCOUNTER — Encounter: Payer: Self-pay | Admitting: Oncology

## 2023-12-17 ENCOUNTER — Encounter: Payer: Self-pay | Admitting: Family Medicine

## 2023-12-17 ENCOUNTER — Telehealth: Payer: Self-pay

## 2023-12-17 ENCOUNTER — Other Ambulatory Visit (HOSPITAL_COMMUNITY): Payer: Self-pay

## 2023-12-17 ENCOUNTER — Telehealth: Payer: Self-pay | Admitting: *Deleted

## 2023-12-17 MED ORDER — SCOPOLAMINE 1 MG/3DAYS TD PT72: 1.0000 | MEDICATED_PATCH | TRANSDERMAL | 1 refills | Status: AC

## 2023-12-17 NOTE — Telephone Encounter (Signed)
Sent mychart letting pt know Dr. Tower's comments. 

## 2023-12-17 NOTE — Telephone Encounter (Signed)
 Pharmacy Patient Advocate Encounter   Received notification from CoverMyMeds that prior authorization for Scopolamine  1MG /3DAYS 72 hr patches is required/requested.   Insurance verification completed.   The patient is insured through [facility] [place] .   Per test claim: PA required; PA submitted to above mentioned insurance via CoverMyMeds Key/confirmation #/EOC (Key: [id number])     Status is pending

## 2023-12-17 NOTE — Telephone Encounter (Signed)
 Pharmacy Patient Advocate Encounter  Received notification from  [facility] Medicaid  that Prior Authorization for Scopolamine  1MG /3DAYS 72 hr patches has been APPROVED from 4.18.25 to 12.31.2099. Ran test claim, Copay is $79.03. This test claim was processed through [facility]- copay amounts may vary at other pharmacies due to pharmacy/plan contracts, or as the patient moves through the different stages of their insurance plan.   PA #/Case ID/Reference #: (Key: [id number])

## 2023-12-17 NOTE — Telephone Encounter (Signed)
 So sorry to hear that but glad you will be able to get away  Have a good safe trip I sent in the patches -caution of sedation  These can also cause dry mouth

## 2023-12-17 NOTE — Telephone Encounter (Signed)
 Pt sent a message saying:  Dr. Malissa Se,  Jim died This past 16-Jan-2024, his obituary is available  online or at [email or web address] - The kids had a trip planned to [place], [place] next week, I am going with them.  I had a Covid Booster a couple weeks ago.  Requesting RX for a motion sickness patch's. Will be gone 5 days, is there anything else you recommend I have on hand?  Thank you

## 2023-12-17 NOTE — Telephone Encounter (Signed)
 Sent phone note to provider.

## 2023-12-29 ENCOUNTER — Other Ambulatory Visit

## 2024-01-18 ENCOUNTER — Ambulatory Visit: Admitting: Podiatry

## 2024-01-25 ENCOUNTER — Other Ambulatory Visit: Payer: Self-pay | Admitting: Family Medicine

## 2024-02-23 ENCOUNTER — Other Ambulatory Visit: Payer: Self-pay | Admitting: Family Medicine

## 2024-02-23 NOTE — Telephone Encounter (Signed)
 Name of Medication: Xanax  Name of Pharmacy: [facility]  Last Fill or Written Date and Quantity: 11/08/23 #30 tabs/ 2 refill Last Office Visit and Type: OV 10/01/23 Next Office Visit and Type: 08/07/2024

## 2024-03-07 ENCOUNTER — Other Ambulatory Visit: Payer: Self-pay | Admitting: Family Medicine

## 2024-03-24 ENCOUNTER — Telehealth: Payer: Self-pay | Admitting: Oncology

## 2024-03-24 NOTE — Telephone Encounter (Signed)
 MD not here 04/14/24.  Spoke with pt to confirm new appt date/time. Pt declined AVS to be mailed

## 2024-04-14 ENCOUNTER — Other Ambulatory Visit: Payer: Medicare Other

## 2024-04-14 ENCOUNTER — Ambulatory Visit: Payer: Medicare Other | Admitting: Oncology

## 2024-05-01 ENCOUNTER — Other Ambulatory Visit: Payer: Self-pay

## 2024-05-01 DIAGNOSIS — C9110 Chronic lymphocytic leukemia of B-cell type not having achieved remission: Secondary | ICD-10-CM

## 2024-05-02 ENCOUNTER — Other Ambulatory Visit: Payer: Self-pay | Admitting: Oncology

## 2024-05-02 ENCOUNTER — Encounter: Payer: Self-pay | Admitting: Oncology

## 2024-05-02 ENCOUNTER — Inpatient Hospital Stay (HOSPITAL_BASED_OUTPATIENT_CLINIC_OR_DEPARTMENT_OTHER): Admitting: Oncology

## 2024-05-02 ENCOUNTER — Inpatient Hospital Stay: Attending: Oncology

## 2024-05-02 ENCOUNTER — Telehealth: Payer: Self-pay | Admitting: *Deleted

## 2024-05-02 VITALS — BP 136/89 | HR 74 | Temp 97.4°F | Resp 16 | Wt 151.0 lb

## 2024-05-02 DIAGNOSIS — C9110 Chronic lymphocytic leukemia of B-cell type not having achieved remission: Secondary | ICD-10-CM | POA: Diagnosis not present

## 2024-05-02 DIAGNOSIS — Z8052 Family history of malignant neoplasm of bladder: Secondary | ICD-10-CM | POA: Diagnosis not present

## 2024-05-02 DIAGNOSIS — Z1231 Encounter for screening mammogram for malignant neoplasm of breast: Secondary | ICD-10-CM

## 2024-05-02 DIAGNOSIS — E538 Deficiency of other specified B group vitamins: Secondary | ICD-10-CM | POA: Diagnosis not present

## 2024-05-02 DIAGNOSIS — Z806 Family history of leukemia: Secondary | ICD-10-CM | POA: Diagnosis not present

## 2024-05-02 DIAGNOSIS — Z791 Long term (current) use of non-steroidal anti-inflammatories (NSAID): Secondary | ICD-10-CM | POA: Diagnosis not present

## 2024-05-02 DIAGNOSIS — I10 Essential (primary) hypertension: Secondary | ICD-10-CM | POA: Insufficient documentation

## 2024-05-02 DIAGNOSIS — Z79899 Other long term (current) drug therapy: Secondary | ICD-10-CM | POA: Diagnosis not present

## 2024-05-02 LAB — CBC WITH DIFFERENTIAL (CANCER CENTER ONLY)
Abs Immature Granulocytes: 0.06 K/uL (ref 0.00–0.07)
Basophils Absolute: 0.1 K/uL (ref 0.0–0.1)
Basophils Relative: 0 %
Eosinophils Absolute: 0.2 K/uL (ref 0.0–0.5)
Eosinophils Relative: 1 %
HCT: 41.4 % (ref 36.0–46.0)
Hemoglobin: 13.4 g/dL (ref 12.0–15.0)
Immature Granulocytes: 0 %
Lymphocytes Relative: 64 %
Lymphs Abs: 11.8 K/uL — ABNORMAL HIGH (ref 0.7–4.0)
MCH: 29.4 pg (ref 26.0–34.0)
MCHC: 32.4 g/dL (ref 30.0–36.0)
MCV: 90.8 fL (ref 80.0–100.0)
Monocytes Absolute: 1.6 K/uL — ABNORMAL HIGH (ref 0.1–1.0)
Monocytes Relative: 9 %
Neutro Abs: 4.8 K/uL (ref 1.7–7.7)
Neutrophils Relative %: 26 %
Platelet Count: 165 K/uL (ref 150–400)
RBC: 4.56 MIL/uL (ref 3.87–5.11)
RDW: 12.3 % (ref 11.5–15.5)
WBC Count: 18.7 K/uL — ABNORMAL HIGH (ref 4.0–10.5)
nRBC: 0 % (ref 0.0–0.2)

## 2024-05-02 MED ORDER — CYANOCOBALAMIN 1000 MCG/ML IJ SOLN: 1000.0000 ug | INTRAMUSCULAR | 3 refills | Status: AC

## 2024-05-02 MED ORDER — BD SAFETYGLIDE SYRINGE/NEEDLE 25G X 1" 3 ML MISC: 1 refills | Status: AC

## 2024-05-02 NOTE — Progress Notes (Signed)
 Pt in for follow up, reports having some issue with bladder not emptying and feeling full at time.  Plans to discuss with PCP next week.

## 2024-05-02 NOTE — Progress Notes (Signed)
 Hematology/Oncology Consult note [facility]  Telephone:([phone] [phone]  Patient Care Team: Tower, Laine LABOR, MD as PCP - General Melanee Annah BROCKS, MD as Consulting Physician (Oncology)   Name of the patient: Anita Buckley  [id number]  Apr 11, 1951   Date of visit: 05/02/24  Diagnosis- SLL/CLL   Chief complaint/ Reason for visit- routine f/u of CLL  Heme/Onc history: Patient is a 73 year old female who was diagnosed with SLL back in 2015.  She was having some problems with her thyroid  gland back then an ultrasound of the soft tissue head and neck in September 2015 revealed homogeneous thyroid  gland with several thyroid  nodules.  Several enlarged left-sided neck lymph nodes.  FNA of the left neck no was consistent with clonal population of lymphocytes CD5 positive, CD23 positive and CD43 8+.  It was negative for malignant cells.  She also had a PET scan subsequently and her last PET scan was in 2016 which showed stable mild metabolism within a 1.2 cm level 3 lymph node but no evidence of hypermetabolism or significant adenopathy elsewhere.  CT chest in February 2018 revealed mild thoracic lymphadenopathy compatible with CLL.     Interval history-patient recently lost her husband to Parkinson's a couple of months ago.  She denies any changes in her appetite or weight.  Denies any new lumps or bumps anywhere.  ECOG PS- 0 Pain scale- 0   Review of systems- Review of Systems  Constitutional:  Negative for chills, fever, malaise/fatigue and weight loss.  HENT:  Negative for congestion, ear discharge and nosebleeds.   Eyes:  Negative for blurred vision.  Respiratory:  Negative for cough, hemoptysis, sputum production, shortness of breath and wheezing.   Cardiovascular:  Negative for chest pain, palpitations, orthopnea and claudication.  Gastrointestinal:  Negative for abdominal pain, blood in stool, constipation, diarrhea, heartburn, melena, nausea  and vomiting.  Genitourinary:  Negative for dysuria, flank pain, frequency, hematuria and urgency.  Musculoskeletal:  Negative for back pain, joint pain and myalgias.  Skin:  Negative for rash.  Neurological:  Negative for dizziness, tingling, focal weakness, seizures, weakness and headaches.  Endo/Heme/Allergies:  Does not bruise/bleed easily.  Psychiatric/Behavioral:  Negative for depression and suicidal ideas. The patient does not have insomnia.       Allergies  Allergen Reactions   Bee Venom     Anaphylaxis    Minocycline Nausea And Vomiting   Penicillins     REACTION: reaction as a child     Past Medical History:  Diagnosis Date   Anxiety    Cancer (HCC)    B CELL LYMPHOMA   Chronic lymphocytic leukemia (CLL), B-cell (HCC) 02/11/2016   Depression    History of anxiety    History of depression    History of fibrocystic disease of breast    History of hyperlipidemia    Hypertension    Situational stress      Past Surgical History:  Procedure Laterality Date   COLONOSCOPY  12/2005   polyps, tics   COLONOSCOPY WITH PROPOFOL  N/A 05/11/2016   Procedure: COLONOSCOPY WITH PROPOFOL ;  Surgeon: Lamar ONEIDA Holmes, MD;  Location: [facility] ENDOSCOPY;  Service: Endoscopy;  Laterality: N/A;   THYROIDECTOMY Left 08/20/2015   Procedure: THYROIDECTOMY/ HEMITHYROIDECTOMY;  Surgeon: Chinita Hasten, MD;  Location: [facility] ORS;  Service: ENT;  Laterality: Left;   TUBAL LIGATION      Social History   Socioeconomic History   Marital status: Married    Spouse name: Not on  file   Number of children: Not on file   Years of education: Not on file   Highest education level: Some college, no degree  Occupational History   Not on file  Tobacco Use   Smoking status: Never   Smokeless tobacco: Never  Vaping Use   Vaping status: Never Used  Substance and Sexual Activity   Alcohol use: Yes    Alcohol/week: 5.0 - 10.0 standard drinks of alcohol    Types: 5 - 10 Glasses of wine per week     Comment: occ (wine with dinner)   Drug use: No   Sexual activity: Not on file  Other Topics Concern   Not on file  Social History Narrative   Lives at home in private residence with husband   Social Drivers of Health   Financial Resource Strain: Low Risk  (05/01/2024)   Overall Financial Resource Strain (CARDIA)    Difficulty of Paying Living Expenses: Not hard at all  Food Insecurity: No Food Insecurity (05/01/2024)   Hunger Vital Sign    Worried About Running Out of Food in the Last Year: Never true    Ran Out of Food in the Last Year: Never true  Transportation Needs: No Transportation Needs (05/01/2024)   PRAPARE - Administrator, Civil Service (Medical): No    Lack of Transportation (Non-Medical): No  Physical Activity: Insufficiently Active (05/01/2024)   Exercise Vital Sign    Days of Exercise per Week: 3 days    Minutes of Exercise per Session: 30 min  Stress: No Stress Concern Present (05/01/2024)   [employer] of Occupational Health - Occupational Stress Questionnaire    Feeling of Stress: Only a little  Social Connections: Socially Isolated (05/01/2024)   Social Connection and Isolation Panel    Frequency of Communication with Friends and Family: More than three times a week    Frequency of Social Gatherings with Friends and Family: Three times a week    Attends Religious Services: Never    Active Member of Clubs or Organizations: No    Attends Banker Meetings: Not on file    Marital Status: Widowed  Intimate Partner Violence: Not At Risk (03/18/2023)   Humiliation, Afraid, Rape, and Kick questionnaire    Fear of Current or Ex-Partner: No    Emotionally Abused: No    Physically Abused: No    Sexually Abused: No    Family History  Problem Relation Age of Onset   Diabetes Father        late in life   Alcohol abuse Father    Hyperlipidemia Mother    Mental illness Mother    Depression Sister    Leukemia Brother    Bladder Cancer  Brother    Schizophrenia Other        Grandfather   Breast cancer Neg Hx      Current Outpatient Medications:    ALPRAZolam  (XANAX ) 0.5 MG tablet, TAKE ONE TABLET BY MOUTH DAILY AS NEEDEDFOR ANXIETY OR SLEEP, Disp: 30 tablet, Rfl: 2   amLODipine  (NORVASC ) 5 MG tablet, TAKE 1 TABLET BY MOUTH DAILY, Disp: 90 tablet, Rfl: 2   busPIRone  (BUSPAR ) 15 MG tablet, TAKE ONE TABLET BY MOUTH TWICE DAILY, Disp: 180 tablet, Rfl: 1   cyanocobalamin  (VITAMIN B12) 1000 MCG/ML injection, Inject 1 mL (1,000 mcg total) into the muscle every 30 (thirty) days., Disp: 4 mL, Rfl: 3   latanoprost (XALATAN) 0.005 % ophthalmic solution, Place 1 drop into both eyes  at bedtime., Disp: , Rfl:    rosuvastatin  (CRESTOR ) 10 MG tablet, TAKE 1 TABLET BY MOUTH DAILY, Disp: 90 tablet, Rfl: 1   SYRINGE-NEEDLE, DISP, 3 ML (BD SAFETYGLIDE SYRINGE/NEEDLE) 25G X 1 3 ML MISC, Use the needle for M injection once a month., Disp: 30 each, Rfl: 1   timolol (TIMOPTIC) 0.5 % ophthalmic solution, 1 drop daily., Disp: , Rfl:    EPINEPHrine  0.3 mg/0.3 mL IJ SOAJ injection, INJECT 0.3ML INTO THE MUSCLE AS NEEDED (Patient not taking: Reported on 05/02/2024), Disp: 2 each, Rfl: 1   meloxicam  (MOBIC ) 15 MG tablet, Take 1 tablet (15 mg total) by mouth daily. (Patient taking differently: Take 15 mg by mouth daily as needed.), Disp: 30 tablet, Rfl: 1   scopolamine  (TRANSDERM-SCOP) 1 MG/3DAYS, Place 1 patch (1.5 mg total) onto the skin every 3 (three) days. (Patient not taking: Reported on 05/02/2024), Disp: 8 patch, Rfl: 1  Physical exam:  Vitals:   05/02/24 1124  BP: 136/89  Pulse: 74  Resp: 16  Temp: (!) 97.4 F (36.3 C)  TempSrc: Tympanic  SpO2: 98%  Weight: 151 lb (68.5 kg)   Physical Exam Cardiovascular:     Rate and Rhythm: Normal rate and regular rhythm.     Heart sounds: Normal heart sounds.  Pulmonary:     Effort: Pulmonary effort is normal.     Breath sounds: Normal breath sounds.  Abdominal:     General: Bowel sounds are  normal.     Palpations: Abdomen is soft.     Comments: No palpable hepatosplenomegaly  Lymphadenopathy:     Comments: Mild non bulky left cervical adenopathy  Skin:    General: Skin is warm and dry.  Neurological:     Mental Status: She is alert and oriented to person, place, and time.      I have personally reviewed labs listed below:    Latest Ref Rng & Units 07/30/2023   10:36 AM  CMP  Glucose 70 - 99 mg/dL 91   BUN 6 - 23 mg/dL 19   Creatinine 9.59 - 1.20 mg/dL 9.25   Sodium 864 - 854 mEq/L 142   Potassium 3.5 - 5.1 mEq/L 4.0   Chloride 96 - 112 mEq/L 104   CO2 19 - 32 mEq/L 30   Calcium  8.4 - 10.5 mg/dL 9.1   Total Protein 6.0 - 8.3 g/dL 6.3   Total Bilirubin 0.2 - 1.2 mg/dL 0.8   Alkaline Phos 39 - 117 U/L 51   AST 0 - 37 U/L 19   ALT 0 - 35 U/L 19       Latest Ref Rng & Units 05/02/2024   11:09 AM  CBC  WBC 4.0 - 10.5 K/uL 18.7   Hemoglobin 12.0 - 15.0 g/dL 86.5   Hematocrit 63.9 - 46.0 % 41.4   Platelets 150 - 400 K/uL 165       Assessment and plan- Patient is a 73 y.o. female here for routine f/u of Rai stage 1 CLL  Patient has evidence of leukocytosis/lymphocytosis and given that her absolute lymphocyte count is more than 5000 she now falls under the category of CLL and not SLL.  She has nonbulky chronic palpable left cervical adenopathy that was biopsy-proven to be B-cell lymphoproliferative disorder.  This has not progressed.  No evidence of B symptoms or cytopenias.  CLL does not require any treatment at this time.  I will continue to follow up with her on a yearly basis.  She has  a history of iron deficiency in the past and will be getting her iron studies checked with her PCP.  She also has a history of B12 deficiency and I am giving her syringes and needles for self administering monthly B12 injections   Visit Diagnosis 1. CLL (chronic lymphocytic leukemia) (HCC)   2. B12 deficiency      Dr. Annah Skene, MD, MPH [facility] [phone] 05/02/2024 12:23 PM

## 2024-05-02 NOTE — Telephone Encounter (Signed)
 Should not be a problem but please do remind me the week before-lab appointment is not until December and I do not order this far ahead Thanks for the heads up

## 2024-05-02 NOTE — Telephone Encounter (Signed)
 Copied from CRM [id number]. Topic: Clinical - Request for Lab/Test Order >> May 02, 2024  4:08 PM Anita Buckley wrote: Reason for CRM: Patient is asking if her Iron levels can be checked with her blood work before her physical for her oncologist is asking to have the Iron checked at that time.

## 2024-05-04 ENCOUNTER — Ambulatory Visit

## 2024-05-04 VITALS — BP 136/89 | Ht 64.75 in | Wt 151.0 lb

## 2024-05-04 DIAGNOSIS — Z0000 Encounter for general adult medical examination without abnormal findings: Secondary | ICD-10-CM | POA: Diagnosis not present

## 2024-05-04 NOTE — Patient Instructions (Signed)
 Anita Buckley,  Thank you for taking the time for your Medicare Wellness Visit. I appreciate your continued commitment to your health goals. Please review the care plan we discussed, and feel free to reach out if I can assist you further.  Medicare recommends these wellness visits once per year to help you and your care team stay ahead of potential health issues. These visits are designed to focus on prevention, allowing your provider to concentrate on managing your acute and chronic conditions during your regular appointments.  Please note that Annual Wellness Visits do not include a physical exam. Some assessments may be limited, especially if the visit was conducted virtually. If needed, we may recommend a separate in-person follow-up with your provider.  Ongoing Care Seeing your primary care provider every 3 to 6 months helps us  monitor your health and provide consistent, personalized care.   Referrals If a referral was made during today's visit and you haven't received any updates within two weeks, please contact the referred provider directly to check on the status.  Recommended Screenings:  Health Maintenance  Topic Date Due   DTaP/Tdap/Td vaccine (4 - Td or Tdap) 01/10/2024   Flu Shot  03/17/2024   COVID-19 Vaccine (8 - 2025-26 season) 04/17/2024   Breast Cancer Screening  05/02/2024   Hepatitis C Screening  08/05/2024*   Medicare Annual Wellness Visit  08/05/2024   DEXA scan (bone density measurement)  12/07/2024   Colon Cancer Screening  04/23/2032   Pneumococcal Vaccine for age over 48  Completed   Zoster (Shingles) Vaccine  Completed   HPV Vaccine  Aged Out   Meningitis B Vaccine  Aged Out  *Topic was postponed. The date shown is not the original due date.       05/04/2024   10:30 AM  Advanced Directives  Does Patient Have a Medical Advance Directive? Yes  Type of Estate agent of [place];Living will  Copy of Healthcare Power of Attorney in  Chart? No - copy requested   Advance Care Planning is important because it: Ensures you receive medical care that aligns with your values, goals, and preferences. Provides guidance to your family and loved ones, reducing the emotional burden of decision-making during critical moments.  Vision: Annual vision screenings are recommended for early detection of glaucoma, cataracts, and diabetic retinopathy. These exams can also reveal signs of chronic conditions such as diabetes and high blood pressure.  Dental: Annual dental screenings help detect early signs of oral cancer, gum disease, and other conditions linked to overall health, including heart disease and diabetes.  Please see the attached documents for additional preventive care recommendations.

## 2024-05-04 NOTE — Progress Notes (Signed)
 Because this visit was a virtual/telehealth visit,  certain criteria was not obtained, such a blood pressure, CBG if applicable, and timed get up and go. Any medications not marked as taking were not mentioned during the medication reconciliation part of the visit. Any vitals not documented were not able to be obtained due to this being a telehealth visit or patient was unable to self-report a recent blood pressure reading due to a lack of equipment at home via telehealth. Vitals that have been documented are verbally provided by the patient.  This visit was performed by a medical professional under my direct supervision. I was immediately available for consultation/collaboration. I have reviewed and agree with the Annual Wellness Visit documentation.  Subjective:   Anita Buckley is a 73 y.o. who presents for a Medicare Wellness preventive visit.  As a reminder, Annual Wellness Visits don't include a physical exam, and some assessments may be limited, especially if this visit is performed virtually. We may recommend an in-person follow-up visit with your provider if needed.  Visit Complete: Virtual I connected with  Anita Buckley on 05/04/24 by a audio enabled telemedicine application and verified that I am speaking with the correct person using two identifiers.  Patient Location: Home  Provider Location: Home Office  I discussed the limitations of evaluation and management by telemedicine. The patient expressed understanding and agreed to proceed.  Vital Signs: Because this visit was a virtual/telehealth visit, some criteria may be missing or patient reported. Any vitals not documented were not able to be obtained and vitals that have been documented are patient reported.  VideoDeclined- This patient declined Librarian, academic. Therefore the visit was completed with audio only.  Persons Participating in Visit: Patient.  AWV Questionnaire: Yes:  Patient Medicare AWV questionnaire was completed by the patient on 05/01/2024; I have confirmed that all information answered by patient is correct and no changes since this date.  Cardiac Risk Factors include: hypertension;advanced age (>1men, >13 women)     Objective:    Today's Vitals   05/04/24 1031  BP: 136/89  Weight: 151 lb (68.5 kg)  Height: 5' 4.75 (1.645 m)   Body mass index is 25.32 kg/m.     05/04/2024   10:30 AM 05/02/2024   11:13 AM 04/14/2023   11:08 AM 03/18/2023   10:13 AM 04/13/2022   11:09 AM 02/10/2022   11:05 AM 04/08/2021   10:02 AM  Advanced Directives  Does Patient Have a Medical Advance Directive? Yes Yes Yes Yes No Yes Yes  Type of Estate agent of [place];Living will Healthcare Power of [place];Living will Healthcare Power of [place];Living will Healthcare Power of [place];Living will  Healthcare Power of [employer] of [place];Living will  Copy of Healthcare Power of Attorney in Chart? No - copy requested No - copy requested  No - copy requested  No - copy requested   Would patient like information on creating a medical advance directive?     No - Patient declined      Current Medications (verified) Outpatient Encounter Medications as of 05/04/2024  Medication Sig   ALPRAZolam  (XANAX ) 0.5 MG tablet TAKE ONE TABLET BY MOUTH DAILY AS NEEDEDFOR ANXIETY OR SLEEP   amLODipine  (NORVASC ) 5 MG tablet TAKE 1 TABLET BY MOUTH DAILY   busPIRone  (BUSPAR ) 15 MG tablet TAKE ONE TABLET BY MOUTH TWICE DAILY   cyanocobalamin  (VITAMIN B12) 1000 MCG/ML injection Inject 1 mL (1,000 mcg total) into the muscle every 30 (thirty)  days.   EPINEPHrine  0.3 mg/0.3 mL IJ SOAJ injection INJECT 0.3ML INTO THE MUSCLE AS NEEDED   latanoprost (XALATAN) 0.005 % ophthalmic solution Place 1 drop into both eyes at bedtime.   rosuvastatin  (CRESTOR ) 10 MG tablet TAKE 1 TABLET BY MOUTH DAILY   SYRINGE-NEEDLE, DISP, 3 ML (BD SAFETYGLIDE SYRINGE/NEEDLE) 25G  X 1 3 ML MISC Use the needle for M injection once a month.   timolol (TIMOPTIC) 0.5 % ophthalmic solution 1 drop daily.   meloxicam  (MOBIC ) 15 MG tablet Take 1 tablet (15 mg total) by mouth daily. (Patient taking differently: Take 15 mg by mouth daily as needed.)   scopolamine  (TRANSDERM-SCOP) 1 MG/3DAYS Place 1 patch (1.5 mg total) onto the skin every 3 (three) days. (Patient not taking: Reported on 05/04/2024)   No facility-administered encounter medications on file as of 05/04/2024.    Allergies (verified) Bee venom, Minocycline, and Penicillins   History: Past Medical History:  Diagnosis Date   Anxiety    Cancer (HCC)    B CELL LYMPHOMA   Chronic lymphocytic leukemia (CLL), B-cell (HCC) 02/11/2016   Depression    History of anxiety    History of depression    History of fibrocystic disease of breast    History of hyperlipidemia    Hypertension    Situational stress    Past Surgical History:  Procedure Laterality Date   COLONOSCOPY  12/2005   polyps, tics   COLONOSCOPY WITH PROPOFOL  N/A 05/11/2016   Procedure: COLONOSCOPY WITH PROPOFOL ;  Surgeon: Lamar ONEIDA Holmes, MD;  Location: [facility] ENDOSCOPY;  Service: Endoscopy;  Laterality: N/A;   THYROIDECTOMY Left 08/20/2015   Procedure: THYROIDECTOMY/ HEMITHYROIDECTOMY;  Surgeon: Chinita Hasten, MD;  Location: [facility] ORS;  Service: ENT;  Laterality: Left;   TUBAL LIGATION     Family History  Problem Relation Age of Onset   Diabetes Father        late in life   Alcohol abuse Father    Hyperlipidemia Mother    Mental illness Mother    Depression Sister    Leukemia Brother    Bladder Cancer Brother    Schizophrenia Other        Grandfather   Breast cancer Neg Hx    Social History   Socioeconomic History   Marital status: Married    Spouse name: Not on file   Number of children: Not on file   Years of education: Not on file   Highest education level: Some college, no degree  Occupational History   Not on file  Tobacco Use    Smoking status: Never   Smokeless tobacco: Never  Vaping Use   Vaping status: Never Used  Substance and Sexual Activity   Alcohol use: Yes    Alcohol/week: 5.0 - 10.0 standard drinks of alcohol    Types: 5 - 10 Glasses of wine per week    Comment: occ (wine with dinner)   Drug use: No   Sexual activity: Not on file  Other Topics Concern   Not on file  Social History Narrative   Lives at home in private residence with husband   Social Drivers of Health   Financial Resource Strain: Low Risk  (05/01/2024)   Overall Financial Resource Strain (CARDIA)    Difficulty of Paying Living Expenses: Not hard at all  Food Insecurity: No Food Insecurity (05/01/2024)   Hunger Vital Sign    Worried About Running Out of Food in the Last Year: Never true    Ran Out  of Food in the Last Year: Never true  Transportation Needs: No Transportation Needs (05/01/2024)   PRAPARE - Administrator, Civil Service (Medical): No    Lack of Transportation (Non-Medical): No  Physical Activity: Insufficiently Active (05/01/2024)   Exercise Vital Sign    Days of Exercise per Week: 3 days    Minutes of Exercise per Session: 30 min  Stress: No Stress Concern Present (05/01/2024)   [employer] of Occupational Health - Occupational Stress Questionnaire    Feeling of Stress: Only a little  Social Connections: Socially Isolated (05/01/2024)   Social Connection and Isolation Panel    Frequency of Communication with Friends and Family: More than three times a week    Frequency of Social Gatherings with Friends and Family: Three times a week    Attends Religious Services: Never    Active Member of Clubs or Organizations: No    Attends Banker Meetings: Never    Marital Status: Widowed    Tobacco Counseling Counseling given: Not Answered    Clinical Intake:  Pre-visit preparation completed: Yes  Pain : No/denies pain     BMI - recorded: 25.32 Nutritional Status: BMI 25 -29  Overweight Nutritional Risks: None Diabetes: No  Lab Results  Component Value Date   HGBA1C 5.9 07/30/2023   HGBA1C 5.9 07/23/2022   HGBA1C 5.8 07/30/2021     How often do you need to have someone help you when you read instructions, pamphlets, or other written materials from your doctor or pharmacy?: 1 - Never  Interpreter Needed?: No  Information entered by :: Benn Tarver,CMA   Activities of Daily Living     05/01/2024    3:43 PM  In your present state of health, do you have any difficulty performing the following activities:  Hearing? 0  Vision? 0  Difficulty concentrating or making decisions? 0  Walking or climbing stairs? 0  Dressing or bathing? 0  Doing errands, shopping? 0  Preparing Food and eating ? N  Using the Toilet? N  In the past six months, have you accidently leaked urine? N  Do you have problems with loss of bowel control? N  Managing your Medications? N  Managing your Finances? N  Housekeeping or managing your Housekeeping? N    Patient Care Team: Tower, Laine LABOR, MD as PCP - General Melanee Annah BROCKS, MD as Consulting Physician (Oncology)  I have updated your Care Teams any recent Medical Services you may have received from other providers in the past year.     Assessment:   This is a routine wellness examination for Anita Buckley.  Hearing/Vision screen Hearing Screening - Comments:: No hearing dificulties Vision Screening - Comments:: Patient wears glasses    Goals Addressed             This Visit's Progress    Patient Stated       Patient she would to start painting        Depression Screen     05/04/2024   10:35 AM 05/02/2024   11:17 AM 10/01/2023   11:05 AM 08/06/2023   10:56 AM 03/18/2023   10:12 AM 04/28/2022    3:29 PM 02/10/2022   11:13 AM  PHQ 2/9 Scores  PHQ - 2 Score 2 0 2 3 0 0 0  PHQ- 9 Score 2  8 8  8      Fall Risk     05/01/2024    3:43 PM 10/01/2023   11:05 AM  08/06/2023   10:53 AM 03/15/2023   11:04 AM 02/10/2022    11:05 AM  Fall Risk   Falls in the past year? 0 0 0 0 0  Number falls in past yr: 0 0 0 0 0  Injury with Fall? 0 0 0 0 0  Risk for fall due to : No Fall Risks No Fall Risks No Fall Risks No Fall Risks   Follow up Education provided;Falls evaluation completed;Falls prevention discussed Falls evaluation completed Falls evaluation completed Falls prevention discussed;Falls evaluation completed Falls evaluation completed;Education provided;Falls prevention discussed      Data saved with a previous flowsheet row definition    MEDICARE RISK AT HOME:  Medicare Risk at Home Any stairs in or around the home?: (Patient-Rptd) Yes If so, are there any without handrails?: (Patient-Rptd) No Home free of loose throw rugs in walkways, pet beds, electrical cords, etc?: (Patient-Rptd) Yes Adequate lighting in your home to reduce risk of falls?: (Patient-Rptd) Yes Life alert?: (Patient-Rptd) No Use of a cane, walker or w/c?: (Patient-Rptd) No Grab bars in the bathroom?: (Patient-Rptd) Yes Shower chair or bench in shower?: (Patient-Rptd) Yes Elevated toilet seat or a handicapped toilet?: (Patient-Rptd) No  TIMED UP AND GO:  Was the test performed?  No  Cognitive Function: 6CIT completed    01/31/2021    2:04 PM 01/09/2020   11:25 AM  MMSE - Mini Mental State Exam  Not completed: Refused   Orientation to time  5  Orientation to Place  5  Registration  3  Attention/ Calculation  5  Recall  3  Language- repeat  1        05/04/2024   10:35 AM 03/18/2023   10:16 AM 02/10/2022   11:06 AM  6CIT Screen  What Year? 0 points 0 points 0 points  What month? 0 points 0 points 0 points  What time? 0 points 0 points 0 points  Count back from 20 0 points 0 points 0 points  Months in reverse 0 points 0 points 0 points  Repeat phrase 0 points 2 points 0 points  Total Score 0 points 2 points 0 points    Immunizations Immunization History  Administered Date(s) Administered   INFLUENZA, HIGH DOSE  SEASONAL PF 05/28/2021, 05/29/2021, 05/25/2022   Influenza Whole 05/08/2008, 06/17/2009   Influenza-Unspecified 06/01/2015, 06/03/2017, 07/04/2018, 06/21/2019, 05/20/2020, 08/25/2023   PFIZER Comirnaty(Gray Top)Covid-19 Tri-Sucrose Vaccine 11/26/2020, 05/16/2022   PFIZER(Purple Top)SARS-COV-2 Vaccination 08/22/2019, 09/12/2019, 03/31/2020   Pfizer Covid-19 Vaccine Bivalent Booster 75yrs & up 05/09/2021, 12/20/2021   Pneumococcal Conjugate-13 03/01/2018   Pneumococcal Polysaccharide-23 04/28/2019   Respiratory Syncytial Virus Vaccine,Recomb Aduvanted(Arexvy) 07/31/2022   Td 08/26/2000, 08/18/2003   Tdap 01/09/2014   Zoster Recombinant(Shingrix) 06/21/2019, 10/23/2019   Zoster, Live 06/01/2007    Screening Tests Health Maintenance  Topic Date Due   DTaP/Tdap/Td (4 - Td or Tdap) 01/10/2024   Influenza Vaccine  03/17/2024   COVID-19 Vaccine (8 - 2025-26 season) 04/17/2024   Mammogram  05/02/2024   Hepatitis C Screening  08/05/2024 (Originally 10/29/1968)   Medicare Annual Wellness (AWV)  08/05/2024   DEXA SCAN  12/07/2024   Colonoscopy  04/23/2032   Pneumococcal Vaccine: 50+ Years  Completed   Zoster Vaccines- Shingrix  Completed   HPV VACCINES  Aged Out   Meningococcal B Vaccine  Aged Out    Health Maintenance Items Addressed:patient states she will get vaccinations soon  Additional Screening:  Vision Screening: Recommended annual ophthalmology exams for early detection of glaucoma and other disorders  of the eye. Is the patient up to date with their annual eye exam?  Yes    Dental Screening: Recommended annual dental exams for proper oral hygiene  Community Resource Referral / Chronic Care Management: CRR required this visit?  No   CCM required this visit?  No   Plan:    I have personally reviewed and noted the following in the patient's chart:   Medical and social history Use of alcohol, tobacco or illicit drugs  Current medications and supplements including opioid  prescriptions. Patient is not currently taking opioid prescriptions. Functional ability and status Nutritional status Physical activity Advanced directives List of other physicians Hospitalizations, surgeries, and ER visits in previous 12 months Vitals Screenings to include cognitive, depression, and falls Referrals and appointments  In addition, I have reviewed and discussed with patient certain preventive protocols, quality metrics, and best practice recommendations. A written personalized care plan for preventive services as well as general preventive health recommendations were provided to patient.   Lyle MARLA Right, [place]   05/04/2024   After Visit Summary: (MyChart) Due to this being a telephonic visit, the after visit summary with patients personalized plan was offered to patient via MyChart   Notes: Nothing significant to report at this time.

## 2024-05-08 DIAGNOSIS — Z23 Encounter for immunization: Secondary | ICD-10-CM | POA: Diagnosis not present

## 2024-05-08 NOTE — Telephone Encounter (Signed)
 Fyi, annual exam is now 10/31/24 and lab visit is now 10/24/24.   Spoke with pt notifying her Dr Randeen will add lab orders. When I mentioned about appts showing r/s, pt states she just saw oncology and wants that appt and annual visit 6 mos apart.

## 2024-05-11 ENCOUNTER — Ambulatory Visit: Admitting: Family Medicine

## 2024-05-17 ENCOUNTER — Other Ambulatory Visit: Payer: Self-pay | Admitting: Family Medicine

## 2024-05-17 NOTE — Telephone Encounter (Signed)
 Name of Medication: Xanax  Name of Pharmacy: [facility]  Last Fill or Written Date and Quantity: 02/23/24 #30 tabs/ 2 refill Last Office Visit and Type: OV 10/01/23 Next Office Visit and Type: CPE 10/31/24

## 2024-05-18 ENCOUNTER — Encounter: Payer: Self-pay | Admitting: Family Medicine

## 2024-05-20 ENCOUNTER — Other Ambulatory Visit: Payer: Self-pay | Admitting: Family Medicine

## 2024-05-30 DIAGNOSIS — H40001 Preglaucoma, unspecified, right eye: Secondary | ICD-10-CM | POA: Diagnosis not present

## 2024-05-30 DIAGNOSIS — H401121 Primary open-angle glaucoma, left eye, mild stage: Secondary | ICD-10-CM | POA: Diagnosis not present

## 2024-05-30 DIAGNOSIS — H2513 Age-related nuclear cataract, bilateral: Secondary | ICD-10-CM | POA: Diagnosis not present

## 2024-05-30 DIAGNOSIS — D3131 Benign neoplasm of right choroid: Secondary | ICD-10-CM | POA: Diagnosis not present

## 2024-06-01 ENCOUNTER — Ambulatory Visit
Admission: RE | Admit: 2024-06-01 | Discharge: 2024-06-01 | Disposition: A | Discharge status: Home / Self Care | Source: Ambulatory Visit | Attending: Oncology | Admitting: Oncology

## 2024-06-01 DIAGNOSIS — Z1231 Encounter for screening mammogram for malignant neoplasm of breast: Secondary | ICD-10-CM | POA: Insufficient documentation

## 2024-06-06 DIAGNOSIS — Z23 Encounter for immunization: Secondary | ICD-10-CM | POA: Diagnosis not present

## 2024-06-22 DIAGNOSIS — M76899 Other specified enthesopathies of unspecified lower limb, excluding foot: Secondary | ICD-10-CM | POA: Diagnosis not present

## 2024-06-22 DIAGNOSIS — M545 Low back pain, unspecified: Secondary | ICD-10-CM | POA: Diagnosis not present

## 2024-06-28 ENCOUNTER — Ambulatory Visit: Admitting: Podiatry

## 2024-07-26 DIAGNOSIS — M25551 Pain in right hip: Secondary | ICD-10-CM | POA: Diagnosis not present

## 2024-07-28 ENCOUNTER — Ambulatory Visit: Admitting: Podiatrist

## 2024-07-28 DIAGNOSIS — M722 Plantar fascial fibromatosis: Secondary | ICD-10-CM

## 2024-07-28 DIAGNOSIS — Q666 Other congenital valgus deformities of feet: Secondary | ICD-10-CM

## 2024-07-28 NOTE — Progress Notes (Signed)
 Patient presents for orthotic modification appointment.  She has 2 pr of orthotics she got from us  and states they feel a little too hard on her heels, She would like to see if shaving down the extrinsic posts would help with her discomfort and wearability of the orthotics  We did discuss a new orthotic made of cork with a eva or plastizole topcover .Anita Buckley That's an option pending how she does once adjusted.  Also I gave her a form to call he insurance with codes to see if orthotics may be covered under her plan.

## 2024-07-31 ENCOUNTER — Other Ambulatory Visit: Payer: Medicare Other

## 2024-08-07 ENCOUNTER — Encounter: Payer: Medicare Other | Admitting: Family Medicine

## 2024-08-08 ENCOUNTER — Telehealth: Payer: Self-pay | Admitting: Podiatry

## 2024-08-08 ENCOUNTER — Telehealth: Payer: Self-pay

## 2024-08-08 NOTE — Telephone Encounter (Signed)
 Stated she had a message from Darice to call the office (our number). She is checking to see if orthotics that was left on 07/28/2024 are ready.

## 2024-08-08 NOTE — Telephone Encounter (Signed)
 I returned the patients call 1:20 pm. LVM stating her orthotics are at the [place] office and that she can pick them up at the front desk. Per note from [facility] no appointment is needed.

## 2024-08-08 NOTE — Telephone Encounter (Signed)
 The patient picked up her orthotics 08/08/24-kdr

## 2024-08-08 NOTE — Telephone Encounter (Signed)
" °  LVM to call the office regarding picking up her orthotics. Per a note from Lindsay she does not need an appointment.    "

## 2024-08-13 ENCOUNTER — Other Ambulatory Visit: Payer: Self-pay | Admitting: Family Medicine

## 2024-08-14 NOTE — Telephone Encounter (Signed)
 Name of Medication: Xanax  Name of Pharmacy: [facility]  Last Fill or Written Date and Quantity: 05/17/24 #30 tabs/ 2 refill Last Office Visit and Type: OV 10/01/23 Next Office Visit and Type: CPE 10/31/24

## 2024-08-24 ENCOUNTER — Other Ambulatory Visit

## 2024-09-01 ENCOUNTER — Other Ambulatory Visit: Payer: Self-pay | Admitting: Family Medicine

## 2024-09-12 ENCOUNTER — Telehealth: Payer: Self-pay | Admitting: Family Medicine

## 2024-09-12 NOTE — Telephone Encounter (Signed)
 Copied from CRM #[id number]. Topic: Clinical - Medication Refill >> Sep 11, 2024 12:13 PM Brittany M wrote: Medication: ALPRAZolam  (XANAX ) 0.5 MG tablet  Has the patient contacted their pharmacy? Yes (Agent: If no, request that the patient contact the pharmacy for the refill. If patient does not wish to contact the pharmacy document the reason why and proceed with request.) (Agent: If yes, when and what did the pharmacy advise?)  This is the patient's preferred pharmacy:  [facility] - [place], [place] - [address] [address] Phone: [phone] Fax: [phone]  Is this the correct pharmacy for this prescription? Yes If no, delete pharmacy and type the correct one.   Has the prescription been filled recently? Yes  Is the patient out of the medication? Yes  Has the patient been seen for an appointment in the last year OR does the patient have an upcoming appointment? Yes  Can we respond through MyChart? Yes  Agent: Please be advised that Rx refills may take up to 3 business days. We ask that you follow-up with your pharmacy.

## 2024-09-12 NOTE — Telephone Encounter (Addendum)
 Should have 2 refills on file called pharmacy and they said pt does have refills on file and she has already picked up med today

## 2024-10-24 ENCOUNTER — Other Ambulatory Visit

## 2024-10-31 ENCOUNTER — Encounter: Admitting: Family Medicine

## 2025-05-01 ENCOUNTER — Other Ambulatory Visit

## 2025-05-01 ENCOUNTER — Ambulatory Visit: Admitting: Oncology

## 2025-05-02 ENCOUNTER — Other Ambulatory Visit

## 2025-05-02 ENCOUNTER — Ambulatory Visit: Admitting: Oncology

## 2025-05-08 ENCOUNTER — Ambulatory Visit

## 2025-05-09 ENCOUNTER — Ambulatory Visit
